# Patient Record
Sex: Male | Born: 1935
Health system: Southern US, Community
[De-identification: ages and names within clinical notes are randomized; demographics above are authoritative.]

## PROBLEM LIST (undated history)

## (undated) DIAGNOSIS — I714 Abdominal aortic aneurysm, without rupture, unspecified: Secondary | ICD-10-CM

## (undated) DIAGNOSIS — K5790 Diverticulosis of intestine, part unspecified, without perforation or abscess without bleeding: Secondary | ICD-10-CM

## (undated) DIAGNOSIS — K219 Gastro-esophageal reflux disease without esophagitis: Secondary | ICD-10-CM

## (undated) DIAGNOSIS — C4490 Unspecified malignant neoplasm of skin, unspecified: Secondary | ICD-10-CM

## (undated) DIAGNOSIS — I10 Essential (primary) hypertension: Secondary | ICD-10-CM

## (undated) DIAGNOSIS — I472 Ventricular tachycardia: Secondary | ICD-10-CM

## (undated) DIAGNOSIS — E538 Deficiency of other specified B group vitamins: Secondary | ICD-10-CM

## (undated) DIAGNOSIS — R7989 Other specified abnormal findings of blood chemistry: Secondary | ICD-10-CM

## (undated) DIAGNOSIS — G629 Polyneuropathy, unspecified: Secondary | ICD-10-CM

## (undated) DIAGNOSIS — G709 Myoneural disorder, unspecified: Secondary | ICD-10-CM

## (undated) DIAGNOSIS — E119 Type 2 diabetes mellitus without complications: Secondary | ICD-10-CM

## (undated) DIAGNOSIS — C801 Malignant (primary) neoplasm, unspecified: Secondary | ICD-10-CM

## (undated) DIAGNOSIS — I219 Acute myocardial infarction, unspecified: Secondary | ICD-10-CM

## (undated) DIAGNOSIS — I255 Ischemic cardiomyopathy: Secondary | ICD-10-CM

## (undated) DIAGNOSIS — E785 Hyperlipidemia, unspecified: Secondary | ICD-10-CM

## (undated) DIAGNOSIS — I2510 Atherosclerotic heart disease of native coronary artery without angina pectoris: Secondary | ICD-10-CM

## (undated) DIAGNOSIS — D649 Anemia, unspecified: Secondary | ICD-10-CM

## (undated) DIAGNOSIS — R0600 Dyspnea, unspecified: Secondary | ICD-10-CM

## (undated) HISTORY — PX: VASCULAR SURGERY: SHX849

## (undated) HISTORY — DX: Acute myocardial infarction, unspecified: I21.9

## (undated) HISTORY — PX: OTHER SURGICAL HISTORY: SHX169

## (undated) HISTORY — PX: EYE SURGERY: SHX253

## (undated) HISTORY — PX: CORONARY ANGIOPLASTY: SHX604

## (undated) HISTORY — DX: Ischemic cardiomyopathy: I25.5

## (undated) HISTORY — DX: Ventricular tachycardia: I47.2

---

## 2004-12-14 ENCOUNTER — Ambulatory Visit: Payer: Self-pay | Admitting: Orthopaedic Surgery

## 2004-12-28 ENCOUNTER — Inpatient Hospital Stay: Payer: Self-pay | Admitting: Internal Medicine

## 2004-12-28 ENCOUNTER — Other Ambulatory Visit: Payer: Self-pay

## 2005-03-14 ENCOUNTER — Ambulatory Visit: Payer: Self-pay | Admitting: Internal Medicine

## 2005-07-31 ENCOUNTER — Ambulatory Visit: Payer: Self-pay | Admitting: Orthopaedic Surgery

## 2006-05-08 ENCOUNTER — Ambulatory Visit: Payer: Self-pay | Admitting: Gastroenterology

## 2006-05-28 HISTORY — PX: COLON SURGERY: SHX602

## 2006-06-26 ENCOUNTER — Inpatient Hospital Stay: Payer: Self-pay | Admitting: Surgery

## 2006-08-06 ENCOUNTER — Ambulatory Visit: Payer: Self-pay | Admitting: Internal Medicine

## 2006-08-28 ENCOUNTER — Ambulatory Visit: Payer: Self-pay | Admitting: Internal Medicine

## 2006-11-06 ENCOUNTER — Ambulatory Visit: Payer: Self-pay | Admitting: Internal Medicine

## 2006-11-27 ENCOUNTER — Ambulatory Visit: Payer: Self-pay | Admitting: Internal Medicine

## 2006-12-17 ENCOUNTER — Ambulatory Visit: Payer: Self-pay | Admitting: Pain Medicine

## 2007-01-02 ENCOUNTER — Ambulatory Visit: Payer: Self-pay | Admitting: Pain Medicine

## 2007-01-16 ENCOUNTER — Ambulatory Visit: Payer: Self-pay | Admitting: Pain Medicine

## 2007-02-26 ENCOUNTER — Ambulatory Visit: Payer: Self-pay | Admitting: Internal Medicine

## 2007-03-05 ENCOUNTER — Ambulatory Visit: Payer: Self-pay | Admitting: Internal Medicine

## 2007-03-29 ENCOUNTER — Ambulatory Visit: Payer: Self-pay | Admitting: Internal Medicine

## 2007-05-29 ENCOUNTER — Ambulatory Visit: Payer: Self-pay | Admitting: Internal Medicine

## 2007-06-04 ENCOUNTER — Ambulatory Visit: Payer: Self-pay | Admitting: Internal Medicine

## 2007-06-29 ENCOUNTER — Ambulatory Visit: Payer: Self-pay | Admitting: Internal Medicine

## 2007-08-29 ENCOUNTER — Ambulatory Visit: Payer: Self-pay | Admitting: Internal Medicine

## 2007-09-11 ENCOUNTER — Ambulatory Visit: Payer: Self-pay | Admitting: Gastroenterology

## 2007-09-24 ENCOUNTER — Ambulatory Visit: Payer: Self-pay | Admitting: Internal Medicine

## 2007-09-29 ENCOUNTER — Ambulatory Visit: Payer: Self-pay | Admitting: Internal Medicine

## 2007-10-27 ENCOUNTER — Ambulatory Visit: Payer: Self-pay | Admitting: Internal Medicine

## 2007-12-27 ENCOUNTER — Ambulatory Visit: Payer: Self-pay | Admitting: Internal Medicine

## 2008-01-21 ENCOUNTER — Ambulatory Visit: Payer: Self-pay | Admitting: Internal Medicine

## 2008-01-27 ENCOUNTER — Ambulatory Visit: Payer: Self-pay | Admitting: Internal Medicine

## 2008-05-28 ENCOUNTER — Ambulatory Visit: Payer: Self-pay | Admitting: Internal Medicine

## 2008-06-23 ENCOUNTER — Ambulatory Visit: Payer: Self-pay | Admitting: Internal Medicine

## 2008-06-28 ENCOUNTER — Ambulatory Visit: Payer: Self-pay | Admitting: Internal Medicine

## 2008-10-15 ENCOUNTER — Ambulatory Visit: Payer: Self-pay | Admitting: Gastroenterology

## 2008-11-26 ENCOUNTER — Ambulatory Visit: Payer: Self-pay | Admitting: Internal Medicine

## 2008-12-22 ENCOUNTER — Ambulatory Visit: Payer: Self-pay | Admitting: Internal Medicine

## 2008-12-26 ENCOUNTER — Ambulatory Visit: Payer: Self-pay | Admitting: Internal Medicine

## 2009-05-28 ENCOUNTER — Ambulatory Visit: Payer: Self-pay | Admitting: Internal Medicine

## 2009-06-23 ENCOUNTER — Ambulatory Visit: Payer: Self-pay | Admitting: Internal Medicine

## 2009-06-28 ENCOUNTER — Ambulatory Visit: Payer: Self-pay | Admitting: Internal Medicine

## 2009-11-18 ENCOUNTER — Ambulatory Visit: Payer: Self-pay | Admitting: Gastroenterology

## 2009-11-26 ENCOUNTER — Ambulatory Visit: Payer: Self-pay | Admitting: Internal Medicine

## 2009-12-22 ENCOUNTER — Ambulatory Visit: Payer: Self-pay | Admitting: Internal Medicine

## 2009-12-26 ENCOUNTER — Ambulatory Visit: Payer: Self-pay | Admitting: Internal Medicine

## 2010-01-26 ENCOUNTER — Ambulatory Visit: Payer: Self-pay | Admitting: Internal Medicine

## 2010-06-13 ENCOUNTER — Ambulatory Visit: Payer: Self-pay | Admitting: Internal Medicine

## 2010-12-14 ENCOUNTER — Ambulatory Visit: Payer: Self-pay | Admitting: Internal Medicine

## 2010-12-20 ENCOUNTER — Ambulatory Visit: Payer: Self-pay | Admitting: Internal Medicine

## 2010-12-27 ENCOUNTER — Ambulatory Visit: Payer: Self-pay | Admitting: Internal Medicine

## 2011-06-19 ENCOUNTER — Ambulatory Visit: Payer: Self-pay | Admitting: Internal Medicine

## 2011-12-19 ENCOUNTER — Ambulatory Visit: Payer: Self-pay | Admitting: Internal Medicine

## 2011-12-19 LAB — COMPREHENSIVE METABOLIC PANEL
Albumin: 4.3 g/dL (ref 3.4–5.0)
BUN: 16 mg/dL (ref 7–18)
Chloride: 104 mmol/L (ref 98–107)
Creatinine: 1.08 mg/dL (ref 0.60–1.30)
EGFR (African American): >60
EGFR (Non-African Amer.): >60
Osmolality: 288 (ref 275–301)
Potassium: 4.1 mmol/L (ref 3.5–5.1)
SGOT(AST): 9 U/L — ABNORMAL LOW (ref 15–37)
SGPT (ALT): 18 U/L
Sodium: 144 mmol/L (ref 136–145)
Total Protein: 7.6 g/dL (ref 6.4–8.2)

## 2011-12-19 LAB — CBC CANCER CENTER
Basophil #: 0.2 x10 3/mm — ABNORMAL HIGH (ref 0.0–0.1)
Basophil %: 2 %
Eosinophil #: 0.7 x10 3/mm (ref 0.0–0.7)
Eosinophil %: 8.9 %
Lymphocyte #: 1.5 x10 3/mm (ref 1.0–3.6)
Lymphocyte %: 19.5 %
MCH: 29.7 pg (ref 26.0–34.0)
MCV: 92 fL (ref 80–100)
Monocyte #: 0.6 x10 3/mm (ref 0.2–1.0)
Monocyte %: 7.6 %
Neutrophil %: 62 %
Platelet: 195 x10 3/mm (ref 150–440)
RBC: 4.98 10*6/uL (ref 4.40–5.90)

## 2011-12-27 ENCOUNTER — Ambulatory Visit: Payer: Self-pay | Admitting: Internal Medicine

## 2012-02-20 ENCOUNTER — Ambulatory Visit: Payer: Self-pay | Admitting: Gastroenterology

## 2012-02-21 LAB — PATHOLOGY REPORT

## 2012-08-28 HISTORY — PX: ABDOMINAL AORTIC ANEURYSM REPAIR: SHX42

## 2014-03-06 ENCOUNTER — Ambulatory Visit: Payer: Self-pay | Admitting: Vascular Surgery

## 2014-04-14 ENCOUNTER — Ambulatory Visit: Payer: Self-pay | Admitting: Vascular Surgery

## 2014-04-14 LAB — CBC
HCT: 40.5 % (ref 40.0–52.0)
HGB: 13.4 g/dL (ref 13.0–18.0)
MCH: 29.8 pg (ref 26.0–34.0)
MCHC: 33 g/dL (ref 32.0–36.0)
MCV: 90 fL (ref 80–100)
Platelet: 169 10*3/uL (ref 150–440)
RBC: 4.49 10*6/uL (ref 4.40–5.90)
RDW: 13.7 % (ref 11.5–14.5)
WBC: 7.3 10*3/uL (ref 3.8–10.6)

## 2014-04-14 LAB — BASIC METABOLIC PANEL
ANION GAP: 8 (ref 7–16)
BUN: 15 mg/dL (ref 7–18)
CO2: 29 mmol/L (ref 21–32)
Calcium, Total: 8.8 mg/dL (ref 8.5–10.1)
Chloride: 105 mmol/L (ref 98–107)
Creatinine: 1.09 mg/dL (ref 0.60–1.30)
EGFR (African American): >60
EGFR (Non-African Amer.): >60
GLUCOSE: 136 mg/dL — AB (ref 65–99)
OSMOLALITY: 286 (ref 275–301)
Potassium: 4.9 mmol/L (ref 3.5–5.1)
SODIUM: 142 mmol/L (ref 136–145)

## 2014-04-14 LAB — URINALYSIS, COMPLETE
BACTERIA: NONE SEEN
Bilirubin,UR: NEGATIVE
Blood: NEGATIVE
Glucose,UR: NEGATIVE mg/dL (ref 0–75)
Hyaline Cast: 9
KETONE: NEGATIVE
Nitrite: NEGATIVE
PH: 5 (ref 4.5–8.0)
PROTEIN: NEGATIVE
RBC,UR: 3 /HPF (ref 0–5)
SQUAMOUS EPITHELIAL: NONE SEEN
Specific Gravity: 1.015 (ref 1.003–1.030)

## 2014-04-14 LAB — PROTIME-INR
INR: 1
PROTHROMBIN TIME: 13.5 s (ref 11.5–14.7)

## 2014-04-14 LAB — MRSA PCR SCREENING

## 2014-04-14 LAB — APTT: ACTIVATED PTT: 29 s (ref 23.6–35.9)

## 2014-04-22 ENCOUNTER — Inpatient Hospital Stay: Payer: Self-pay | Admitting: Vascular Surgery

## 2014-04-23 LAB — COMPREHENSIVE METABOLIC PANEL
ALBUMIN: 3.5 g/dL (ref 3.4–5.0)
Alkaline Phosphatase: 51 U/L
Anion Gap: 12 (ref 7–16)
BILIRUBIN TOTAL: 0.4 mg/dL (ref 0.2–1.0)
BUN: 14 mg/dL (ref 7–18)
CALCIUM: 8.3 mg/dL — AB (ref 8.5–10.1)
CHLORIDE: 108 mmol/L — AB (ref 98–107)
Co2: 21 mmol/L (ref 21–32)
Creatinine: 1.09 mg/dL (ref 0.60–1.30)
EGFR (African American): >60
EGFR (Non-African Amer.): >60
Glucose: 150 mg/dL — ABNORMAL HIGH (ref 65–99)
Osmolality: 285 (ref 275–301)
Potassium: 4.6 mmol/L (ref 3.5–5.1)
SGOT(AST): 18 U/L (ref 15–37)
SGPT (ALT): 15 U/L
SODIUM: 141 mmol/L (ref 136–145)
Total Protein: 6.7 g/dL (ref 6.4–8.2)

## 2014-04-23 LAB — PROTIME-INR
INR: 1.1
Prothrombin Time: 13.9 secs (ref 11.5–14.7)

## 2014-04-23 LAB — CBC WITH DIFFERENTIAL/PLATELET
BASOS ABS: 0 10*3/uL (ref 0.0–0.1)
Basophil %: 0.3 %
Eosinophil #: 0 10*3/uL (ref 0.0–0.7)
Eosinophil %: 0 %
HCT: 40.8 % (ref 40.0–52.0)
HGB: 13.5 g/dL (ref 13.0–18.0)
Lymphocyte #: 0.8 10*3/uL — ABNORMAL LOW (ref 1.0–3.6)
Lymphocyte %: 7.4 %
MCH: 30 pg (ref 26.0–34.0)
MCHC: 33.2 g/dL (ref 32.0–36.0)
MCV: 90 fL (ref 80–100)
MONO ABS: 0.8 x10 3/mm (ref 0.2–1.0)
Monocyte %: 7.6 %
NEUTROS ABS: 9.2 10*3/uL — AB (ref 1.4–6.5)
NEUTROS PCT: 84.7 %
Platelet: 184 10*3/uL (ref 150–440)
RBC: 4.52 10*6/uL (ref 4.40–5.90)
RDW: 13.9 % (ref 11.5–14.5)
WBC: 10.8 10*3/uL — ABNORMAL HIGH (ref 3.8–10.6)

## 2014-04-23 LAB — PHOSPHORUS: Phosphorus: 3.1 mg/dL (ref 2.5–4.9)

## 2014-04-23 LAB — APTT: Activated PTT: 29.6 secs (ref 23.6–35.9)

## 2014-04-23 LAB — MAGNESIUM: MAGNESIUM: 1.8 mg/dL

## 2014-09-19 DIAGNOSIS — D631 Anemia in chronic kidney disease: Secondary | ICD-10-CM | POA: Diagnosis not present

## 2014-09-19 DIAGNOSIS — N2889 Other specified disorders of kidney and ureter: Secondary | ICD-10-CM | POA: Diagnosis not present

## 2014-09-19 DIAGNOSIS — N179 Acute kidney failure, unspecified: Secondary | ICD-10-CM | POA: Diagnosis not present

## 2014-09-19 DIAGNOSIS — E871 Hypo-osmolality and hyponatremia: Secondary | ICD-10-CM | POA: Diagnosis not present

## 2014-09-28 DIAGNOSIS — E119 Type 2 diabetes mellitus without complications: Secondary | ICD-10-CM | POA: Diagnosis not present

## 2014-11-04 DIAGNOSIS — I739 Peripheral vascular disease, unspecified: Secondary | ICD-10-CM | POA: Diagnosis not present

## 2014-11-04 DIAGNOSIS — I10 Essential (primary) hypertension: Secondary | ICD-10-CM | POA: Diagnosis not present

## 2014-11-04 DIAGNOSIS — E785 Hyperlipidemia, unspecified: Secondary | ICD-10-CM | POA: Diagnosis not present

## 2014-11-04 DIAGNOSIS — I714 Abdominal aortic aneurysm, without rupture: Secondary | ICD-10-CM | POA: Diagnosis not present

## 2014-11-04 DIAGNOSIS — E669 Obesity, unspecified: Secondary | ICD-10-CM | POA: Diagnosis not present

## 2014-12-19 NOTE — Op Note (Signed)
PATIENT NAME:  Nicholas Parker, Nicholas Parker MR#:  [id number] DATE OF BIRTH:  1934-08-09  DATE OF PROCEDURE:  04/22/2014  PREOPERATIVE DIAGNOSES:  1.  Abdominal aortic aneurysm.  2.  Hypertension.  3.  Diabetes.   POSTOPERATIVE DIAGNOSES: 1.  Abdominal aortic aneurysm.  2.  Hypertension.  3.  Diabetes.   PROCEDURES PERFORMED: 1.  Repair of abdominal aortic aneurysm with Gore C3 Excluder endoprosthesis.  2.  Introduction catheter intra-aorta percutaneously, right femoral approach.  3.  Introduction catheter intra-aorta percutaneously, left femoral approach.  4.  Bilateral closure of femoral access sites using the Perclose device in a pre-close technique.   SURGEONS: Leotis Pain, MD  CO-SURGEON: Katha Cabal, MD   ANESTHESIA: General by endotracheal intubation.   FLUIDS: Per anesthesia record.   ESTIMATED BLOOD LOSS: 25 mL.   SPECIMEN: None.   FLUOROSCOPY TIME: 9.9 minutes.   CONTRAST USED: Isovue 65 mL.   INDICATIONS: Mr. Savarese is a 81 year old gentleman who was found to have an abdominal aortic aneurysm greater than 5 cm. It was suitable for endovascular repair. Risks, benefits, as well as alternative therapies were reviewed. All questions answered. The patient has agreed to proceed.   DESCRIPTION OF PROCEDURE: The patient is taken to special procedures and placed in the supine position. After adequate sedation is achieved, the patient was prepped from just below the nipple line to just above the knees and then draped in a sterile fashion.   Appropriate timeout is called.   With Dr. Lucky Cowboy working on the right and myself working on the left, common femoral arteries are imaged with ultrasound. They are both noted to be echolucent and pulsatile indicating patency. Images recorded for the permanent record, and under real-time ultrasound visualization Seldinger needle is used to access the right common femoral, and a micropuncture needle used for the left. Wires are then advanced and 6 [place]  sheath is placed. Perclose devices are then utilized in a pre-close fashion, placing the first Perclose device on both sides at the 11 o'clock position and the second one at 1:00.   Sheaths are then upsized to an 8 [place] sheath. Amplatz Super Stiff wire is advanced through the left side and a J-wire and KMP catheter in the right.   With the pigtail catheter positioned at the level of T12, AP projection of the aorta is obtained. Length measurements are then made. Heparin 6000 units is given and a 23 x 14 x 18 main body is selected and prepped on the back field. The sheath on the left is then upsized from 8 [place] to 53 [place]. Sheath on the right side is upsized from an 8 [place] to a 12 [place].   The main body is then advanced through the sheath and positioned just at the level of the renals. Follow up imaging is obtained under magnified view with approximately 10 degrees cranial and the C3 main body deployed down to release the contralateral gate.   The KMP catheter is then pulled back into the aneurysm sac, and using a glidewire and the KMP catheter, the contralateral gate is engaged, and subsequently the wire is advanced through the main body. Pigtail catheter is then exchanged for the Kumpe catheter, twirled in the main body verifying proper positioning, and then advanced to just above the stent graft. Follow up imaging demonstrates that the left renal is partially covered and therefore the C3 device is re-constrained and moved down approximately 2 mm, released and subsequently follow up imaging demonstrates excellent location within  a millimeter of the left renal, which is the lower renal. Amplatz Super Stiff wire is advanced through the pigtail catheter from the right. LAO projection of the pelvis is obtained length and contrast is injected. Length measurements are made and a 16 x 14 contralateral limb is prepped on the back table, advanced through the 12 French sheath and deployed.   RAO projection  in the right is then obtained and the internal iliac is marked and then the ipsilateral limb is deployed with slight upward pressure to allow for the stent to land proximal to the origin, which it did.   Coda balloon is then used to seal both sides. Pigtail catheter is advanced up the right and an AP projection of the aorta is obtained with 30 mL bolus of contrast looking for endoleaks. No further endoleaks are noted and it has excellent seal both proximally and distally. The catheter is removed over wire.   Working first on the left and then the right, the Perclose devices are sealed. On the left, a third device is placed with excellent result and hemostasis is obtained. On the right, the 2 pre-close worked without difficulty. Light pressure is held for 5 minutes. Both incisions are then closed with Monocryl and Dermabond. The patient tolerated the procedure well. There were no complications.   INTERPRETATION: Initial views of the aneurysm demonstrate infrarenal location. There is mild to moderate bilateral renal artery disease, more so on the right than the left. Left is the lower renal artery. There is moderate atherosclerotic disease at the aortic bifurcation.   Following deployment of the stent graft and sealing proximally and distally, there is complete seal with exclusion of the aneurysm. There is no evidence of type 1, type 2 or type 3 endoleak.   ADDENDUM: During the deployment of the graft, after the graft had been completely deployed, two 10 x 4 balloons were used to angioplasty the origins of the iliac arteries to fully expand the stent graft. Inflations were for 30 seconds to 10 atmospheres.   ____________________________ Katha Cabal, MD ggs:TT D: 04/22/2014 12:02:20 ET T: 04/22/2014 22:23:36 ET JOB#: [id number]  cc: Katha Cabal, MD, <Dictator> Adin Hector, MD Katha Cabal MD ELECTRONICALLY SIGNED 05/05/2014 22:39

## 2014-12-19 NOTE — Op Note (Signed)
PATIENT NAME:  Nicholas Parker, Nicholas Parker MR#:  [id number] DATE OF BIRTH:  August 24, 1934  DATE OF PROCEDURE:  04/22/2014  PREOPERATIVE DIAGNOSES:  1.  Abdominal aortic aneurysm.  2.  Hypertension.  3.  Hyperlipidemia.   POSTOPERATIVE DIAGNOSES:  1.  Abdominal aortic aneurysm.  2.  Hypertension.  3.  Hyperlipidemia.   PROCEDURE: 1.  Ultrasound guidance for vascular access to bilateral femoral arteries; right by Dr. Lucky Cowboy, left by Dr. Delana Meyer.  2.  Catheter placement to aorta from bilateral femoral approaches; right by Dr. Lucky Cowboy, left by Dr. Delana Meyer.  3.  Placement of a Gore Excluder endoprosthesis, main body, left, main body 23 mm diameter proximal, 14 mm diameter distal, 18 cm length, with a 16 mm diameter by 14 cm length contralateral leg. 4.  ProGlide closure device, bilateral femoral arteries; right by Dr. Lucky Cowboy, left by Dr. Delana Meyer.   CO- SURGEONS: Katha Cabal, MD and Algernon Huxley, MD.   ANESTHESIA: General.   BLOOD LOSS: 25 mL  FLUOROSCOPY TIME: Approximately 10 minutes.   CONTRAST: 65 mL of contrast was used.   INDICATION FOR PROCEDURE: A 81 year old gentleman with an abdominal aortic aneurysm of greater than 5 cm in maximal diameter has now reached acceptable size for prophylactic repair. He is brought in for an endovascular repair, as his anatomy is suitable for an endovascular repair. Risks and benefits were discussed. Informed consent was obtained.   DESCRIPTION OF THE PROCEDURE: The patient is brought to the vascular suite with the help of our anesthesia colleagues providing general anesthetic. The abdomen and groins were sterilely prepped and draped, and a sterile surgical field was created. Both femoral arteries were visualized under ultrasound and found to be patent and accessed under direct ultrasound guidance without difficulty with a Seldinger needle. A J-wire and 6-French sheaths were placed. Dr. Delana Meyer performed the left and I performed the right. Permanent images were recorded.  J-wires were placed and we exchanged and did 2 ProGlide devices in a preclose fashion on each side. Again, Dr. Delana Meyer did the left and I did the right, and 8-French sheaths were placed and the patient was systemically heparinized. The pigtail catheter was placed up the left initially and aortogram was performed. This showed the known infrarenal abdominal aortic aneurysm. There was a wide-mouthed origin of the left renal artery and some mild disease in the right renal artery which was not flow limiting. We then upsized to a 16 Fr sheath over a stiff wire on the left side and I put a Kumpe catheter up the right for a magnified image at the level of the renal arteries. The stent graft was then deployed at the base of the left renal artery, which was slightly lower from the left side. This was a 23 mm diameter, 18 cm length device with a 14 mm left iliac limb. I then cannulated the contralateral gate without difficulty with a Kumpe catheter and stiff angled Glidewire, and confirmed successful cannulation by twirling the pigtail catheter in the main body. Imaging performed of the main body after initial deployment showed about a millimeter or two of the left renal artery was covered, so we elected to reconstrain the device. It was reconstrained and deployed about a millimeter or two below the left renal artery, which was lower. At this point retrograde arteriogram was performed of the right femoral artery, and I used a 16 mm diameter by 14 cm length iliac limb on the right to come down to just above the hypogastric artery.  Dr. Delana Meyer completed the deployment in the main body on the left just above the hypogastric artery. All junction points and seal zones were ironed out with a compliant balloon, and we used a 10 mm noncompliant balloon in the proximal iliac arteries bilaterally to ensure that there was no narrowing in these locations. Pigtail catheter was then placed up on the right and then a completion aortogram was  performed. This showed excellent flow through both the renal arteries. Both hypogastric arteries remained patent, and there was excellent flow through the stent graft with no significant endoleak seen. At this point, we elected to terminate the procedure. The sheaths were removed. The ProGlide devices were secured down with excellent hemostatic result and the addition of a third ProGlide device on the left. Skin incision was closed with Monocryl and a sterile dressing was placed. The patient tolerated the procedure well and was taken to the recovery room in stable condition.   ____________________________ Algernon Huxley, MD jsd:ST D: 04/22/2014 11:50:20 ET T: 04/22/2014 22:51:26 ET JOB#: [id number]  cc: Algernon Huxley, MD, <Dictator> Adin Hector, MD Algernon Huxley MD ELECTRONICALLY SIGNED 04/27/2014 11:50

## 2015-02-01 DIAGNOSIS — I10 Essential (primary) hypertension: Secondary | ICD-10-CM | POA: Diagnosis not present

## 2015-02-01 DIAGNOSIS — E780 Pure hypercholesterolemia: Secondary | ICD-10-CM | POA: Diagnosis not present

## 2015-02-01 DIAGNOSIS — I2510 Atherosclerotic heart disease of native coronary artery without angina pectoris: Secondary | ICD-10-CM | POA: Diagnosis not present

## 2015-02-01 DIAGNOSIS — E119 Type 2 diabetes mellitus without complications: Secondary | ICD-10-CM | POA: Diagnosis not present

## 2015-02-04 DIAGNOSIS — E782 Mixed hyperlipidemia: Secondary | ICD-10-CM | POA: Insufficient documentation

## 2015-02-04 DIAGNOSIS — I10 Essential (primary) hypertension: Secondary | ICD-10-CM | POA: Insufficient documentation

## 2015-02-04 DIAGNOSIS — E785 Hyperlipidemia, unspecified: Secondary | ICD-10-CM | POA: Insufficient documentation

## 2015-02-08 DIAGNOSIS — I714 Abdominal aortic aneurysm, without rupture: Secondary | ICD-10-CM | POA: Diagnosis not present

## 2015-02-08 DIAGNOSIS — I10 Essential (primary) hypertension: Secondary | ICD-10-CM | POA: Diagnosis not present

## 2015-02-08 DIAGNOSIS — I2510 Atherosclerotic heart disease of native coronary artery without angina pectoris: Secondary | ICD-10-CM | POA: Diagnosis not present

## 2015-02-08 DIAGNOSIS — D519 Vitamin B12 deficiency anemia, unspecified: Secondary | ICD-10-CM | POA: Diagnosis not present

## 2015-02-12 DIAGNOSIS — I10 Essential (primary) hypertension: Secondary | ICD-10-CM | POA: Diagnosis not present

## 2015-02-12 DIAGNOSIS — I2510 Atherosclerotic heart disease of native coronary artery without angina pectoris: Secondary | ICD-10-CM | POA: Diagnosis not present

## 2015-02-12 DIAGNOSIS — I714 Abdominal aortic aneurysm, without rupture: Secondary | ICD-10-CM | POA: Diagnosis not present

## 2015-02-12 DIAGNOSIS — E782 Mixed hyperlipidemia: Secondary | ICD-10-CM | POA: Diagnosis not present

## 2015-03-05 DIAGNOSIS — X32XXXA Exposure to sunlight, initial encounter: Secondary | ICD-10-CM | POA: Diagnosis not present

## 2015-03-05 DIAGNOSIS — L570 Actinic keratosis: Secondary | ICD-10-CM | POA: Diagnosis not present

## 2015-03-05 DIAGNOSIS — L218 Other seborrheic dermatitis: Secondary | ICD-10-CM | POA: Diagnosis not present

## 2015-03-05 DIAGNOSIS — Z85828 Personal history of other malignant neoplasm of skin: Secondary | ICD-10-CM | POA: Diagnosis not present

## 2015-03-05 DIAGNOSIS — H0015 Chalazion left lower eyelid: Secondary | ICD-10-CM | POA: Diagnosis not present

## 2015-03-11 DIAGNOSIS — Z86010 Personal history of colonic polyps: Secondary | ICD-10-CM | POA: Diagnosis not present

## 2015-03-11 DIAGNOSIS — Z85038 Personal history of other malignant neoplasm of large intestine: Secondary | ICD-10-CM | POA: Diagnosis not present

## 2015-04-12 ENCOUNTER — Encounter: Payer: Self-pay | Admitting: *Deleted

## 2015-04-13 ENCOUNTER — Ambulatory Visit
Admission: RE | Admit: 2015-04-13 | Discharge: 2015-04-13 | Disposition: A | Discharge status: Home / Self Care | Payer: Commercial Managed Care - HMO | Source: Ambulatory Visit | Attending: Gastroenterology | Admitting: Gastroenterology

## 2015-04-13 ENCOUNTER — Ambulatory Visit: Payer: Commercial Managed Care - HMO | Admitting: Anesthesiology

## 2015-04-13 ENCOUNTER — Encounter
Admission: RE | Disposition: A | Discharge status: Home / Self Care | Payer: Self-pay | Source: Ambulatory Visit | Attending: Gastroenterology

## 2015-04-13 ENCOUNTER — Encounter: Payer: Self-pay | Admitting: *Deleted

## 2015-04-13 DIAGNOSIS — E538 Deficiency of other specified B group vitamins: Secondary | ICD-10-CM | POA: Insufficient documentation

## 2015-04-13 DIAGNOSIS — I714 Abdominal aortic aneurysm, without rupture: Secondary | ICD-10-CM | POA: Diagnosis not present

## 2015-04-13 DIAGNOSIS — Z955 Presence of coronary angioplasty implant and graft: Secondary | ICD-10-CM | POA: Diagnosis not present

## 2015-04-13 DIAGNOSIS — G629 Polyneuropathy, unspecified: Secondary | ICD-10-CM | POA: Insufficient documentation

## 2015-04-13 DIAGNOSIS — Z85038 Personal history of other malignant neoplasm of large intestine: Secondary | ICD-10-CM | POA: Diagnosis not present

## 2015-04-13 DIAGNOSIS — Z7982 Long term (current) use of aspirin: Secondary | ICD-10-CM | POA: Insufficient documentation

## 2015-04-13 DIAGNOSIS — Z86010 Personal history of colonic polyps: Secondary | ICD-10-CM | POA: Diagnosis not present

## 2015-04-13 DIAGNOSIS — Z09 Encounter for follow-up examination after completed treatment for conditions other than malignant neoplasm: Secondary | ICD-10-CM | POA: Diagnosis not present

## 2015-04-13 DIAGNOSIS — D649 Anemia, unspecified: Secondary | ICD-10-CM | POA: Insufficient documentation

## 2015-04-13 DIAGNOSIS — Z85828 Personal history of other malignant neoplasm of skin: Secondary | ICD-10-CM | POA: Insufficient documentation

## 2015-04-13 DIAGNOSIS — J449 Chronic obstructive pulmonary disease, unspecified: Secondary | ICD-10-CM | POA: Insufficient documentation

## 2015-04-13 DIAGNOSIS — I2510 Atherosclerotic heart disease of native coronary artery without angina pectoris: Secondary | ICD-10-CM | POA: Insufficient documentation

## 2015-04-13 DIAGNOSIS — Z87891 Personal history of nicotine dependence: Secondary | ICD-10-CM | POA: Diagnosis not present

## 2015-04-13 DIAGNOSIS — E119 Type 2 diabetes mellitus without complications: Secondary | ICD-10-CM | POA: Insufficient documentation

## 2015-04-13 DIAGNOSIS — K219 Gastro-esophageal reflux disease without esophagitis: Secondary | ICD-10-CM | POA: Diagnosis not present

## 2015-04-13 DIAGNOSIS — E291 Testicular hypofunction: Secondary | ICD-10-CM | POA: Insufficient documentation

## 2015-04-13 DIAGNOSIS — I10 Essential (primary) hypertension: Secondary | ICD-10-CM | POA: Diagnosis not present

## 2015-04-13 DIAGNOSIS — E785 Hyperlipidemia, unspecified: Secondary | ICD-10-CM | POA: Insufficient documentation

## 2015-04-13 DIAGNOSIS — Z539 Procedure and treatment not carried out, unspecified reason: Secondary | ICD-10-CM | POA: Insufficient documentation

## 2015-04-13 DIAGNOSIS — Z888 Allergy status to other drugs, medicaments and biological substances status: Secondary | ICD-10-CM | POA: Insufficient documentation

## 2015-04-13 DIAGNOSIS — Z79899 Other long term (current) drug therapy: Secondary | ICD-10-CM | POA: Insufficient documentation

## 2015-04-13 HISTORY — DX: Type 2 diabetes mellitus without complications: E11.9

## 2015-04-13 HISTORY — DX: Abdominal aortic aneurysm, without rupture: I71.4

## 2015-04-13 HISTORY — DX: Abdominal aortic aneurysm, without rupture, unspecified: I71.40

## 2015-04-13 HISTORY — DX: Myoneural disorder, unspecified: G70.9

## 2015-04-13 HISTORY — DX: Deficiency of other specified B group vitamins: E53.8

## 2015-04-13 HISTORY — DX: Atherosclerotic heart disease of native coronary artery without angina pectoris: I25.10

## 2015-04-13 HISTORY — DX: Essential (primary) hypertension: I10

## 2015-04-13 HISTORY — PX: COLONOSCOPY WITH PROPOFOL: SHX5780

## 2015-04-13 HISTORY — DX: Anemia, unspecified: D64.9

## 2015-04-13 HISTORY — DX: Other specified abnormal findings of blood chemistry: R79.89

## 2015-04-13 HISTORY — DX: Unspecified malignant neoplasm of skin, unspecified: C44.90

## 2015-04-13 HISTORY — DX: Polyneuropathy, unspecified: G62.9

## 2015-04-13 HISTORY — DX: Hyperlipidemia, unspecified: E78.5

## 2015-04-13 HISTORY — DX: Malignant (primary) neoplasm, unspecified: C80.1

## 2015-04-13 LAB — GLUCOSE, CAPILLARY: GLUCOSE-CAPILLARY: 110 mg/dL — AB (ref 65–99)

## 2015-04-13 SURGERY — COLONOSCOPY WITH PROPOFOL
Anesthesia: General

## 2015-04-13 MED ORDER — EPHEDRINE SULFATE 50 MG/ML IJ SOLN
INTRAMUSCULAR | Status: DC | PRN
  Administered 2015-04-13: 10 mg via INTRAVENOUS

## 2015-04-13 MED ORDER — SODIUM CHLORIDE 0.9 % IV SOLN: INTRAVENOUS | Status: DC

## 2015-04-13 MED ORDER — PHENYLEPHRINE HCL 10 MG/ML IJ SOLN
INTRAMUSCULAR | Status: DC | PRN
  Administered 2015-04-13: 50 ug via INTRAVENOUS

## 2015-04-13 MED ORDER — PROPOFOL INFUSION 10 MG/ML OPTIME
INTRAVENOUS | Status: DC | PRN
  Administered 2015-04-13: 120 ug/kg/min via INTRAVENOUS

## 2015-04-13 MED ORDER — SODIUM CHLORIDE 0.9 % IV SOLN
INTRAVENOUS | Status: DC
  Administered 2015-04-13: 09:00:00 via INTRAVENOUS

## 2015-04-13 NOTE — Op Note (Signed)
[facility] Gastroenterology Patient Name: Christopher Glasscock Procedure Date: 04/13/2015 9:04 AM MRN: [id number] Account #: [id number] Date of Birth: June 30, 1934 Admit Type: Outpatient Age: 81 Room: [facility] ENDO ROOM 3 Gender: Male Note Status: Finalized Procedure:         Colonoscopy Indications:       Personal history of malignant neoplasm of the colon,                     Personal history of colonic polyps Providers:         Lollie Sails, MD Referring MD:      [name], MD (Referring MD) Medicines:         Monitored Anesthesia Care Complications:     No immediate complications. Procedure:         Pre-Anesthesia Assessment:                    - ASA Grade Assessment: III - A patient with severe                     systemic disease.                    After obtaining informed consent, the colonoscope was                     passed under direct vision. Throughout the procedure, the                     patient's blood pressure, pulse, and oxygen saturations                     were monitored continuously. The Colonoscope was                     introduced through the anus with the intention of                     advancing to the cecum. The scope was advanced to the                     sigmoid colon before the procedure was aborted.                     Medications were given. The colonoscopy was unusually                     difficult due to poor endoscopic visualization. The                     patient tolerated the procedure well. Findings:      the scope was carefully advanced to about the upper sigmoid to       descencing colon region. There was residual prep debris, coating the       lens with an oily residue precluding adequate examination. This would       not come off of the lens despite multiple attempts to clean the lens and       rinse. Proceedure aborted. Impression:        - No specimens collected.                    - The procedure was aborted due to  poor endoscopic  visualization. Recommendation:    - Put patient on a clear liquid diet starting today.                     Reschedule for tomorrow. Repeat prep. Lollie Sails, MD 04/13/2015 9:27:53 AM This report has been signed electronically. Number of Addenda: 0 Note Initiated On: 04/13/2015 9:04 AM Total Procedure Duration: 0 hours 7 minutes 9 seconds       [facility]

## 2015-04-13 NOTE — Transfer of Care (Signed)
Immediate Anesthesia Transfer of Care Note  Patient: Nicholas Parker  Procedure(s) Performed: Procedure(s): COLONOSCOPY WITH PROPOFOL (N/A)  Patient Location: PACU  Anesthesia Type:General  Level of Consciousness: awake  Airway & Oxygen Therapy: Patient Spontanous Breathing  Post-op Assessment: Report given to RN  Post vital signs: stable  Last Vitals:  Filed Vitals:   04/13/15 0931  BP: 113/39  Pulse: 54  Temp:   Resp: 27    Complications: No apparent anesthesia complications

## 2015-04-13 NOTE — Anesthesia Preprocedure Evaluation (Addendum)
Anesthesia Evaluation  Patient identified by MRN, date of birth, ID band Patient awake    Reviewed: Allergy & Precautions, NPO status , Patient's Chart, lab work & pertinent test results  History of Anesthesia Complications Negative for: history of anesthetic complications  Airway Mallampati: II  TM Distance: >3 FB Neck ROM: Full    Dental  (+) Partial Lower   Pulmonary COPD (mild, not tx)former smoker,          Cardiovascular hypertension, Pt. on medications + CAD (stents)     Neuro/Psych    GI/Hepatic GERD-  Medicated and Controlled,  Endo/Other  diabetes, Type 2, Oral Hypoglycemic Agents  Renal/GU      Musculoskeletal   Abdominal   Peds  Hematology  (+) anemia ,   Anesthesia Other Findings   Reproductive/Obstetrics                            Anesthesia Physical Anesthesia Plan  ASA: III  Anesthesia Plan: General   Post-op Pain Management:    Induction: Intravenous  Airway Management Planned: Nasal Cannula  Additional Equipment:   Intra-op Plan:   Post-operative Plan:   Informed Consent: I have reviewed the patients History and Physical, chart, labs and discussed the procedure including the risks, benefits and alternatives for the proposed anesthesia with the patient or authorized representative who has indicated his/her understanding and acceptance.     Plan Discussed with:   Anesthesia Plan Comments:         Anesthesia Quick Evaluation

## 2015-04-13 NOTE — Anesthesia Postprocedure Evaluation (Addendum)
  Anesthesia Post-op Note  Patient: Nicholas Parker  Procedure(s) Performed: Procedure(s): COLONOSCOPY WITH PROPOFOL (N/A)  Anesthesia type:General  Patient location: PACU  Post pain: Pain level controlled  Post assessment: Post-op Vital signs reviewed, Patient's Cardiovascular Status Stable, Respiratory Function Stable, Patent Airway and No signs of Nausea or vomiting  Post vital signs: Reviewed and stable  Last Vitals:  Filed Vitals:   04/13/15 0823  BP: 124/53  Pulse: 59  Temp: 36.4 C  Resp: 19    Level of consciousness: awake, alert  and patient cooperative  Complications: No apparent anesthesia complications

## 2015-04-13 NOTE — H&P (Signed)
Outpatient short stay form Pre-procedure 04/13/2015 9:00 AM Lollie Sails MD  Primary Physician: Dr. [name]  Reason for visit:  Colonoscopy  History of present illness:  Personal history of adenomatous colon polyps, personal history of colon cancer. Patient is a 81 year old male with urgently treated colon cancer diagnosed in 2007. Does has a history of adenomatous colon polyps. He tolerated his prep well. Does take a daily 325 mg aspirin in regards to his history of cardiac stents and stent placement in a abdominal aortic aneurysm. Last ultrasound of that by his vascular surgeon was good.    Current facility-administered medications:  .  0.9 %  sodium chloride infusion, , Intravenous, Continuous, Lollie Sails, MD, Last Rate: 20 mL/hr at 04/13/15 [phone] .  0.9 %  sodium chloride infusion, , Intravenous, Continuous, Lollie Sails, MD  Prescriptions prior to admission  Medication Sig Dispense Refill Last Dose  . aspirin 325 MG tablet Take 325 mg by mouth daily.   Past Week  . glimepiride (AMARYL) 4 MG tablet Take 4 mg by mouth daily with breakfast.   04/13/2015 at 0600  . lisinopril (PRINIVIL,ZESTRIL) 5 MG tablet Take 5 mg by mouth daily.     . metFORMIN (GLUCOPHAGE) 500 MG tablet Take by mouth 2 (two) times daily with a meal.     . metoprolol succinate (TOPROL-XL) 25 MG 24 hr tablet Take 25 mg by mouth daily.   04/13/2015 at 0600  . oxyCODONE-acetaminophen (PERCOCET/ROXICET) 5-325 MG per tablet Take by mouth every 4 (four) hours as needed for severe pain.     . pantoprazole (PROTONIX) 40 MG tablet Take by mouth daily.   04/12/2015 at 0900  . simvastatin (ZOCOR) 40 MG tablet Take 40 mg by mouth daily.   04/12/2015 at 2200  . vitamin B-12 (CYANOCOBALAMIN) 250 MCG tablet Take 500 mcg by mouth daily.        Allergies  Allergen Reactions  . Lipitor [Atorvastatin]   . Pravachol [Pravastatin Sodium]   . Riomet [Metformin Hcl]   . Vytorin [Ezetimibe-Simvastatin]      Past  Medical History  Diagnosis Date  . Hypertension   . Diabetes mellitus without complication   . Hyperlipidemia   . Coronary artery disease   . neuropathy   . AAA (abdominal aortic aneurysm)   . Low testosterone   . Neuropathy   . Skin cancer   . Adenocarcinoma   . Anemia   . B12 deficiency     Review of systems:      Physical Exam    Heart and lungs: Regular rate and rhythm without rub or gallop, lungs are bilaterally clear    HEENT: Normocephalic atraumatic eyes are anicteric    Other:     Pertinant exam for procedure: Soft nontender nondistended bowel sounds positive normoactive    Planned proceedures: Colonoscopy and indicated procedures I have discussed the risks benefits and complications of procedures to include not limited to bleeding, infection, perforation and the risk of sedation and the patient wishes to proceed.    Lollie Sails, MD Gastroenterology 04/13/2015  9:00 AM

## 2015-04-14 ENCOUNTER — Encounter
Admission: RE | Disposition: A | Discharge status: Home / Self Care | Payer: Self-pay | Source: Ambulatory Visit | Attending: Gastroenterology

## 2015-04-14 ENCOUNTER — Ambulatory Visit: Payer: Commercial Managed Care - HMO | Admitting: Anesthesiology

## 2015-04-14 ENCOUNTER — Encounter: Payer: Self-pay | Admitting: Gastroenterology

## 2015-04-14 ENCOUNTER — Ambulatory Visit
Admission: RE | Admit: 2015-04-14 | Discharge: 2015-04-14 | Disposition: A | Discharge status: Home / Self Care | Payer: Commercial Managed Care - HMO | Source: Ambulatory Visit | Attending: Gastroenterology | Admitting: Gastroenterology

## 2015-04-14 DIAGNOSIS — I714 Abdominal aortic aneurysm, without rupture: Secondary | ICD-10-CM | POA: Diagnosis not present

## 2015-04-14 DIAGNOSIS — K648 Other hemorrhoids: Secondary | ICD-10-CM | POA: Diagnosis not present

## 2015-04-14 DIAGNOSIS — Z85828 Personal history of other malignant neoplasm of skin: Secondary | ICD-10-CM | POA: Diagnosis not present

## 2015-04-14 DIAGNOSIS — Z79899 Other long term (current) drug therapy: Secondary | ICD-10-CM | POA: Insufficient documentation

## 2015-04-14 DIAGNOSIS — Z86010 Personal history of colonic polyps: Secondary | ICD-10-CM | POA: Insufficient documentation

## 2015-04-14 DIAGNOSIS — E538 Deficiency of other specified B group vitamins: Secondary | ICD-10-CM | POA: Insufficient documentation

## 2015-04-14 DIAGNOSIS — E119 Type 2 diabetes mellitus without complications: Secondary | ICD-10-CM | POA: Diagnosis not present

## 2015-04-14 DIAGNOSIS — I2510 Atherosclerotic heart disease of native coronary artery without angina pectoris: Secondary | ICD-10-CM | POA: Insufficient documentation

## 2015-04-14 DIAGNOSIS — Z85038 Personal history of other malignant neoplasm of large intestine: Secondary | ICD-10-CM | POA: Diagnosis not present

## 2015-04-14 DIAGNOSIS — Z87891 Personal history of nicotine dependence: Secondary | ICD-10-CM | POA: Diagnosis not present

## 2015-04-14 DIAGNOSIS — Z7982 Long term (current) use of aspirin: Secondary | ICD-10-CM | POA: Diagnosis not present

## 2015-04-14 DIAGNOSIS — Z980 Intestinal bypass and anastomosis status: Secondary | ICD-10-CM | POA: Insufficient documentation

## 2015-04-14 DIAGNOSIS — K635 Polyp of colon: Secondary | ICD-10-CM | POA: Diagnosis not present

## 2015-04-14 DIAGNOSIS — I10 Essential (primary) hypertension: Secondary | ICD-10-CM | POA: Insufficient documentation

## 2015-04-14 DIAGNOSIS — D120 Benign neoplasm of cecum: Secondary | ICD-10-CM | POA: Insufficient documentation

## 2015-04-14 DIAGNOSIS — D649 Anemia, unspecified: Secondary | ICD-10-CM | POA: Insufficient documentation

## 2015-04-14 DIAGNOSIS — E291 Testicular hypofunction: Secondary | ICD-10-CM | POA: Insufficient documentation

## 2015-04-14 DIAGNOSIS — E785 Hyperlipidemia, unspecified: Secondary | ICD-10-CM | POA: Insufficient documentation

## 2015-04-14 DIAGNOSIS — G629 Polyneuropathy, unspecified: Secondary | ICD-10-CM | POA: Diagnosis not present

## 2015-04-14 DIAGNOSIS — K5730 Diverticulosis of large intestine without perforation or abscess without bleeding: Secondary | ICD-10-CM | POA: Insufficient documentation

## 2015-04-14 DIAGNOSIS — Z888 Allergy status to other drugs, medicaments and biological substances status: Secondary | ICD-10-CM | POA: Insufficient documentation

## 2015-04-14 HISTORY — PX: COLONOSCOPY WITH PROPOFOL: SHX5780

## 2015-04-14 LAB — GLUCOSE, CAPILLARY: GLUCOSE-CAPILLARY: 103 mg/dL — AB (ref 65–99)

## 2015-04-14 SURGERY — COLONOSCOPY WITH PROPOFOL
Anesthesia: General

## 2015-04-14 MED ORDER — LIDOCAINE HCL (PF) 1 % IJ SOLN
2.0000 mL | Freq: Once | INTRAMUSCULAR | Status: AC
  Administered 2015-04-14: 0.3 mL via INTRADERMAL
  Filled 2015-04-14: qty 2

## 2015-04-14 MED ORDER — PROPOFOL 10 MG/ML IV BOLUS
INTRAVENOUS | Status: DC | PRN
  Administered 2015-04-14: 50 mg via INTRAVENOUS

## 2015-04-14 MED ORDER — SODIUM CHLORIDE 0.9 % IV SOLN
INTRAVENOUS | Status: DC
  Administered 2015-04-14: 1000 mL via INTRAVENOUS

## 2015-04-14 MED ORDER — LIDOCAINE HCL (CARDIAC) 20 MG/ML IV SOLN
INTRAVENOUS | Status: DC | PRN
  Administered 2015-04-14: 60 mg via INTRAVENOUS

## 2015-04-14 MED ORDER — SODIUM CHLORIDE 0.9 % IV SOLN: INTRAVENOUS | Status: DC

## 2015-04-14 MED ORDER — LIDOCAINE HCL (PF) 1 % IJ SOLN
INTRAMUSCULAR | Status: AC
  Administered 2015-04-14: 0.3 mL via INTRADERMAL
  Filled 2015-04-14: qty 2

## 2015-04-14 MED ORDER — PHENYLEPHRINE HCL 10 MG/ML IJ SOLN
INTRAMUSCULAR | Status: DC | PRN
  Administered 2015-04-14: 100 ug via INTRAVENOUS

## 2015-04-14 MED ORDER — PROPOFOL INFUSION 10 MG/ML OPTIME
INTRAVENOUS | Status: DC | PRN
  Administered 2015-04-14: 180 ug/kg/min via INTRAVENOUS

## 2015-04-14 NOTE — Anesthesia Preprocedure Evaluation (Signed)
Anesthesia Evaluation  Patient identified by MRN, date of birth, ID band Patient awake    Reviewed: Allergy & Precautions, NPO status , Patient's Chart, lab work & pertinent test results  Airway Mallampati: III  TM Distance: <3 FB Neck ROM: Full    Dental  (+) Partial Upper   Pulmonary former smoker,  breath sounds clear to auscultation  Pulmonary exam normal       Cardiovascular hypertension, + CAD and + Peripheral Vascular Disease Normal cardiovascular exam Hx of AAA   Neuro/Psych Diabetic neoropathy negative psych ROS   GI/Hepatic Neg liver ROS, GERD-  Medicated and Controlled,  Endo/Other  diabetes  Renal/GU negative Renal ROS  negative genitourinary   Musculoskeletal   Abdominal Normal abdominal exam  (+)   Peds negative pediatric ROS (+)  Hematology  (+) anemia ,   Anesthesia Other Findings   Reproductive/Obstetrics                             Anesthesia Physical Anesthesia Plan  ASA: III  Anesthesia Plan: General   Post-op Pain Management:    Induction: Intravenous  Airway Management Planned: Nasal Cannula  Additional Equipment:   Intra-op Plan:   Post-operative Plan: Extubation in OR  Informed Consent: I have reviewed the patients History and Physical, chart, labs and discussed the procedure including the risks, benefits and alternatives for the proposed anesthesia with the patient or authorized representative who has indicated his/her understanding and acceptance.   Dental advisory given  Plan Discussed with: CRNA and Surgeon  Anesthesia Plan Comments:         Anesthesia Quick Evaluation

## 2015-04-14 NOTE — H&P (Signed)
Outpatient short stay form Pre-procedure 04/14/2015 1:39 PM Nicholas Sails MD  Primary Physician: Dr. [name]  Reason for visit:  Colonoscopy  History of present illness:  Patient is a 81-year-old male date personal history of colon cancer and adenomatous colon polyps. He came in yesterday and his prep was not good and required repeat prep last night. He tolerated that well. He stopped taking the 325 mg aspirin 1 week ago. He does not take other anticoagulation medications or other aspirin products.    Current facility-administered medications:  .  0.9 %  sodium chloride infusion, , Intravenous, Continuous, Nicholas Sails, MD, Last Rate: 20 mL/hr at 04/14/15 1330, 1,000 mL at 04/14/15 1330 .  0.9 %  sodium chloride infusion, , Intravenous, Continuous, Nicholas Sails, MD  Prescriptions prior to admission  Medication Sig Dispense Refill Last Dose  . aspirin 325 MG tablet Take 325 mg by mouth daily.   Past Week  . glimepiride (AMARYL) 4 MG tablet Take 4 mg by mouth daily with breakfast.   04/13/2015 at 0600  . lisinopril (PRINIVIL,ZESTRIL) 5 MG tablet Take 5 mg by mouth daily.     . metFORMIN (GLUCOPHAGE) 500 MG tablet Take by mouth 2 (two) times daily with a meal.     . metoprolol succinate (TOPROL-XL) 25 MG 24 hr tablet Take 25 mg by mouth daily.   04/14/2015 at 0700  . oxyCODONE-acetaminophen (PERCOCET/ROXICET) 5-325 MG per tablet Take by mouth every 4 (four) hours as needed for severe pain.     . pantoprazole (PROTONIX) 40 MG tablet Take by mouth daily.   04/12/2015 at 0900  . simvastatin (ZOCOR) 40 MG tablet Take 40 mg by mouth daily.   04/12/2015 at 2200  . vitamin B-12 (CYANOCOBALAMIN) 250 MCG tablet Take 500 mcg by mouth daily.        Allergies  Allergen Reactions  . Lipitor [Atorvastatin]   . Pravachol [Pravastatin Sodium]   . Riomet [Metformin Hcl]   . Vytorin [Ezetimibe-Simvastatin]      Past Medical History  Diagnosis Date  . Hypertension   . Diabetes mellitus  without complication   . Hyperlipidemia   . Coronary artery disease   . neuropathy   . AAA (abdominal aortic aneurysm)   . Low testosterone   . Neuropathy   . Skin cancer   . Adenocarcinoma   . Anemia   . B12 deficiency     Review of systems:      Physical Exam    Heart and lungs: Regular rate and rhythm without rub or gallop, lungs are bilaterally clear    HEENT: Normocephalic atraumatic eyes are anicteric    Other:     Pertinant exam for procedure: Soft nontender nondistended bowel sounds positive and normoactive    Planned proceedures: Colonoscopy and indicated procedures I have discussed the risks benefits and complications of procedures to include not limited to bleeding, infection, perforation and the risk of sedation and the patient wishes to proceed.    Nicholas Sails, MD Gastroenterology 04/14/2015  1:39 PM

## 2015-04-14 NOTE — Transfer of Care (Signed)
Immediate Anesthesia Transfer of Care Note  Patient: Nicholas Parker  Procedure(s) Performed: Procedure(s): COLONOSCOPY WITH PROPOFOL (N/A)  Patient Location: PACU  Anesthesia Type:General  Level of Consciousness: awake, alert  and oriented  Airway & Oxygen Therapy: Patient Spontanous Breathing and Patient connected to nasal cannula oxygen  Post-op Assessment: Report given to RN and Post -op Vital signs reviewed and stable  Post vital signs: Reviewed and stable  Last Vitals:  Filed Vitals:   04/14/15 1423  BP: 102/50  Pulse: 63  Temp: 36.1 C  Resp: 20    Complications: No apparent anesthesia complications

## 2015-04-14 NOTE — Op Note (Signed)
[facility] Gastroenterology Patient Name: Nicholas Parker Procedure Date: 04/14/2015 1:15 PM MRN: [id number] Account #: [id number] Date of Birth: [birth year] Admit Type: Outpatient Age: 81 Room: [facility] ENDO ROOM 1 Gender: Male Note Status: Finalized Procedure:         Colonoscopy Indications:       Personal history of malignant neoplasm of the colon,                     Personal history of colonic polyps Providers:         Lollie Sails, MD Referring MD:      [name], MD (Referring MD) Medicines:         Monitored Anesthesia Care Complications:     No immediate complications. Procedure:         Pre-Anesthesia Assessment:                    - ASA Grade Assessment: III - A patient with severe                     systemic disease.                    After obtaining informed consent, the colonoscope was                     passed under direct vision. Throughout the procedure, the                     patient's blood pressure, pulse, and oxygen saturations                     were monitored continuously. The Colonoscope was                     introduced through the anus and advanced to the the cecum,                     identified by appendiceal orifice and ileocecal valve. The                     colonoscopy was performed without difficulty. The patient                     tolerated the procedure well. The quality of the bowel                     preparation was fair. Findings:      There was evidence of a prior end-to-end colo-colonic anastomosis in the       proximal rectum. This was patent. This was characterized by healthy       appearing mucosa. [Traversed].      A few small-mouthed diverticula were found in the sigmoid colon and in       the distal descending colon.      A 32 mm polypoid lesion was found at the distal ileocecal valve. The       lesion was sessile. No bleeding was present. Biopsies were taken with a       cold forceps for histology. I was  unable to intubate the valve, however,       motility caused the valve to efface, showing a much larger lesion than       that seen from the distal side. This was not repeated.  Non-bleeding internal hemorrhoids were found during retroflexion. The       hemorrhoids were small.      The digital rectal exam was normal. Impression:        - Patent end-to-end colo-colonic anastomosis,                     characterized by healthy appearing mucosa.                    - Diverticulosis in the sigmoid colon and in the distal                     descending colon.                    - Rule out malignancy, tumor at the ileocecal valve.                     Biopsied. Biopsied.                    - Non-bleeding internal hemorrhoids. Recommendation:    - Await pathology results.                    - Return to GI clinic in 1 week. Procedure Code(s): --- Professional ---                    [id number], Colonoscopy, flexible; with biopsy, single or                     multiple Diagnosis Code(s): --- Professional ---                    239.0, Neoplasm of unspecified nature of digestive system                    455.0, Internal hemorrhoids without mention of complication                    V10.05, Personal history of malignant neoplasm of large                     intestine                    V12.72, Personal history of colonic polyps                    V45.3, Intestinal bypass or anastomosis status                    562.10, Diverticulosis of colon (without mention of                     hemorrhage) CPT copyright 2014 American Medical Association. All rights reserved. The codes documented in this report are preliminary and upon coder review may  be revised to meet current compliance requirements. Lollie Sails, MD 04/14/2015 2:24:40 PM This report has been signed electronically. Number of Addenda: 0 Note Initiated On: 04/14/2015 1:15 PM Scope Withdrawal Time: 0 hours 18 minutes 46 seconds  Total  Procedure Duration: 0 hours 27 minutes 34 seconds       [facility]

## 2015-04-14 NOTE — Anesthesia Postprocedure Evaluation (Signed)
  Anesthesia Post-op Note  Patient: Nicholas Parker  Procedure(s) Performed: Procedure(s): COLONOSCOPY WITH PROPOFOL (N/A)  Anesthesia type:General  Patient location: PACU  Post pain: Pain level controlled  Post assessment: Post-op Vital signs reviewed, Patient's Cardiovascular Status Stable, Respiratory Function Stable, Patent Airway and No signs of Nausea or vomiting  Post vital signs: Reviewed and stable  Last Vitals:  Filed Vitals:   04/14/15 1500  BP: 134/59  Pulse: 59  Temp:   Resp: 24    Level of consciousness: awake, alert  and patient cooperative  Complications: No apparent anesthesia complications

## 2015-04-16 ENCOUNTER — Encounter: Payer: Self-pay | Admitting: Gastroenterology

## 2015-04-16 LAB — SURGICAL PATHOLOGY

## 2015-04-21 ENCOUNTER — Other Ambulatory Visit: Payer: Self-pay | Admitting: Gastroenterology

## 2015-04-21 DIAGNOSIS — D126 Benign neoplasm of colon, unspecified: Secondary | ICD-10-CM

## 2015-04-26 ENCOUNTER — Ambulatory Visit
Admission: RE | Admit: 2015-04-26 | Discharge: 2015-04-26 | Disposition: A | Discharge status: Home / Self Care | Payer: Commercial Managed Care - HMO | Source: Ambulatory Visit | Attending: Gastroenterology | Admitting: Gastroenterology

## 2015-04-26 DIAGNOSIS — Z85038 Personal history of other malignant neoplasm of large intestine: Secondary | ICD-10-CM | POA: Diagnosis not present

## 2015-04-26 DIAGNOSIS — D126 Benign neoplasm of colon, unspecified: Secondary | ICD-10-CM | POA: Diagnosis not present

## 2015-04-26 DIAGNOSIS — I714 Abdominal aortic aneurysm, without rupture: Secondary | ICD-10-CM | POA: Diagnosis not present

## 2015-04-26 DIAGNOSIS — K4090 Unilateral inguinal hernia, without obstruction or gangrene, not specified as recurrent: Secondary | ICD-10-CM | POA: Diagnosis not present

## 2015-04-26 MED ORDER — IOHEXOL 300 MG/ML  SOLN
100.0000 mL | Freq: Once | INTRAMUSCULAR | Status: AC | PRN
  Administered 2015-04-26: 100 mL via INTRAVENOUS

## 2015-05-19 DIAGNOSIS — E119 Type 2 diabetes mellitus without complications: Secondary | ICD-10-CM | POA: Diagnosis not present

## 2015-05-19 DIAGNOSIS — E785 Hyperlipidemia, unspecified: Secondary | ICD-10-CM | POA: Diagnosis not present

## 2015-05-19 DIAGNOSIS — I714 Abdominal aortic aneurysm, without rupture: Secondary | ICD-10-CM | POA: Diagnosis not present

## 2015-05-19 DIAGNOSIS — I739 Peripheral vascular disease, unspecified: Secondary | ICD-10-CM | POA: Diagnosis not present

## 2015-05-19 DIAGNOSIS — E669 Obesity, unspecified: Secondary | ICD-10-CM | POA: Diagnosis not present

## 2015-05-19 DIAGNOSIS — I10 Essential (primary) hypertension: Secondary | ICD-10-CM | POA: Diagnosis not present

## 2015-06-10 DIAGNOSIS — D369 Benign neoplasm, unspecified site: Secondary | ICD-10-CM | POA: Diagnosis not present

## 2015-07-20 ENCOUNTER — Ambulatory Visit
Admission: RE | Admit: 2015-07-20 | Discharge: 2015-07-20 | Disposition: A | Discharge status: Home / Self Care | Payer: Commercial Managed Care - HMO | Source: Ambulatory Visit | Attending: Gastroenterology | Admitting: Gastroenterology

## 2015-07-20 ENCOUNTER — Encounter
Admission: RE | Disposition: A | Discharge status: Home / Self Care | Payer: Self-pay | Source: Ambulatory Visit | Attending: Gastroenterology

## 2015-07-20 ENCOUNTER — Encounter: Payer: Self-pay | Admitting: *Deleted

## 2015-07-20 ENCOUNTER — Ambulatory Visit: Payer: Commercial Managed Care - HMO | Admitting: Anesthesiology

## 2015-07-20 DIAGNOSIS — Z87891 Personal history of nicotine dependence: Secondary | ICD-10-CM | POA: Diagnosis not present

## 2015-07-20 DIAGNOSIS — Z980 Intestinal bypass and anastomosis status: Secondary | ICD-10-CM | POA: Insufficient documentation

## 2015-07-20 DIAGNOSIS — D120 Benign neoplasm of cecum: Secondary | ICD-10-CM | POA: Diagnosis not present

## 2015-07-20 DIAGNOSIS — Z9049 Acquired absence of other specified parts of digestive tract: Secondary | ICD-10-CM | POA: Insufficient documentation

## 2015-07-20 DIAGNOSIS — Z7984 Long term (current) use of oral hypoglycemic drugs: Secondary | ICD-10-CM | POA: Diagnosis not present

## 2015-07-20 DIAGNOSIS — K5790 Diverticulosis of intestine, part unspecified, without perforation or abscess without bleeding: Secondary | ICD-10-CM | POA: Diagnosis not present

## 2015-07-20 DIAGNOSIS — I739 Peripheral vascular disease, unspecified: Secondary | ICD-10-CM | POA: Insufficient documentation

## 2015-07-20 DIAGNOSIS — E1140 Type 2 diabetes mellitus with diabetic neuropathy, unspecified: Secondary | ICD-10-CM | POA: Diagnosis not present

## 2015-07-20 DIAGNOSIS — D125 Benign neoplasm of sigmoid colon: Secondary | ICD-10-CM | POA: Diagnosis not present

## 2015-07-20 DIAGNOSIS — I10 Essential (primary) hypertension: Secondary | ICD-10-CM | POA: Diagnosis not present

## 2015-07-20 DIAGNOSIS — Z79899 Other long term (current) drug therapy: Secondary | ICD-10-CM | POA: Diagnosis not present

## 2015-07-20 DIAGNOSIS — Z955 Presence of coronary angioplasty implant and graft: Secondary | ICD-10-CM | POA: Insufficient documentation

## 2015-07-20 DIAGNOSIS — Z1211 Encounter for screening for malignant neoplasm of colon: Secondary | ICD-10-CM | POA: Diagnosis not present

## 2015-07-20 DIAGNOSIS — Z7982 Long term (current) use of aspirin: Secondary | ICD-10-CM | POA: Insufficient documentation

## 2015-07-20 DIAGNOSIS — K5730 Diverticulosis of large intestine without perforation or abscess without bleeding: Secondary | ICD-10-CM | POA: Insufficient documentation

## 2015-07-20 DIAGNOSIS — E538 Deficiency of other specified B group vitamins: Secondary | ICD-10-CM | POA: Insufficient documentation

## 2015-07-20 DIAGNOSIS — G709 Myoneural disorder, unspecified: Secondary | ICD-10-CM | POA: Insufficient documentation

## 2015-07-20 DIAGNOSIS — I2510 Atherosclerotic heart disease of native coronary artery without angina pectoris: Secondary | ICD-10-CM | POA: Diagnosis not present

## 2015-07-20 DIAGNOSIS — Z85038 Personal history of other malignant neoplasm of large intestine: Secondary | ICD-10-CM | POA: Diagnosis not present

## 2015-07-20 DIAGNOSIS — D123 Benign neoplasm of transverse colon: Secondary | ICD-10-CM | POA: Diagnosis not present

## 2015-07-20 DIAGNOSIS — K635 Polyp of colon: Secondary | ICD-10-CM | POA: Diagnosis not present

## 2015-07-20 DIAGNOSIS — Z85828 Personal history of other malignant neoplasm of skin: Secondary | ICD-10-CM | POA: Diagnosis not present

## 2015-07-20 DIAGNOSIS — E785 Hyperlipidemia, unspecified: Secondary | ICD-10-CM | POA: Insufficient documentation

## 2015-07-20 HISTORY — PX: COLONOSCOPY WITH PROPOFOL: SHX5780

## 2015-07-20 LAB — GLUCOSE, CAPILLARY: Glucose-Capillary: 124 mg/dL — ABNORMAL HIGH (ref 65–99)

## 2015-07-20 SURGERY — COLONOSCOPY WITH PROPOFOL
Anesthesia: General

## 2015-07-20 MED ORDER — PROPOFOL 500 MG/50ML IV EMUL
INTRAVENOUS | Status: DC | PRN
Start: 2015-07-20 — End: 2015-07-20
  Administered 2015-07-20: 150 ug/kg/min via INTRAVENOUS

## 2015-07-20 MED ORDER — MIDAZOLAM HCL 5 MG/5ML IJ SOLN
INTRAMUSCULAR | Status: DC | PRN
  Administered 2015-07-20: 1 mg via INTRAVENOUS

## 2015-07-20 MED ORDER — SODIUM CHLORIDE 0.9 % IV SOLN
INTRAVENOUS | Status: DC
  Administered 2015-07-20: 09:00:00 via INTRAVENOUS

## 2015-07-20 MED ORDER — FENTANYL CITRATE (PF) 100 MCG/2ML IJ SOLN
INTRAMUSCULAR | Status: DC | PRN
  Administered 2015-07-20: 50 ug via INTRAVENOUS

## 2015-07-20 MED ORDER — SODIUM CHLORIDE 0.9 % IV SOLN
INTRAVENOUS | Status: DC
  Administered 2015-07-20: 08:00:00 via INTRAVENOUS

## 2015-07-20 NOTE — Anesthesia Preprocedure Evaluation (Signed)
Anesthesia Evaluation  Patient identified by MRN, date of birth, ID band Patient awake    Reviewed: Allergy & Precautions, H&P , NPO status , Patient's Chart, lab work & pertinent test results  History of Anesthesia Complications Negative for: history of anesthetic complications  Airway Mallampati: III  TM Distance: >3 FB Neck ROM: limited    Dental  (+) Poor Dentition, Chipped, Missing, Partial Lower   Pulmonary neg shortness of breath, former smoker,    Pulmonary exam normal breath sounds clear to auscultation       Cardiovascular Exercise Tolerance: Poor hypertension, (-) angina+ CAD, + Cardiac Stents, + Peripheral Vascular Disease and + DOE  Normal cardiovascular exam Rhythm:regular Rate:Normal     Neuro/Psych  Neuromuscular disease negative psych ROS   GI/Hepatic negative GI ROS, Neg liver ROS,   Endo/Other  diabetes, Type 2  Renal/GU negative Renal ROS  negative genitourinary   Musculoskeletal   Abdominal   Peds  Hematology negative hematology ROS (+)   Anesthesia Other Findings Past Medical History:   Hypertension                                                 Diabetes mellitus without complication (HCC)                 Hyperlipidemia                                               Coronary artery disease                                      neuropathy                                                   AAA (abdominal aortic aneurysm) (HCC)                        Low testosterone                                             Neuropathy (HCC)                                             Anemia                                                       B12 deficiency  Skin cancer                                                  Adenocarcinoma [facility])                                        Past Surgical History:   EYE SURGERY                                                      Comment:cataracts   arthrosopic rotator cuff repair                               robotic colectomy                                             CORONARY ANGIOPLASTY                                          VASCULAR SURGERY                                              COLONOSCOPY WITH PROPOFOL                       N/A 04/13/2015      Comment:Procedure: COLONOSCOPY WITH PROPOFOL;  Surgeon:              Lollie Sails, MD;  Location: [facility]               ENDOSCOPY;  Service: Endoscopy;  Laterality:               N/A;   COLONOSCOPY WITH PROPOFOL                       N/A 04/14/2015      Comment:Procedure: COLONOSCOPY WITH PROPOFOL;  Surgeon:              Lollie Sails, MD;  Location: [facility]               ENDOSCOPY;  Service: Endoscopy;  Laterality:               N/A;  BMI    Body Mass Index   28.18 kg/m 2      Reproductive/Obstetrics negative OB ROS                             Anesthesia Physical Anesthesia Plan  ASA: IV  Anesthesia Plan: General   Post-op Pain Management:    Induction:   Airway Management Planned:   Additional Equipment:   Intra-op Plan:   Post-operative Plan:  Informed Consent: I have reviewed the patients History and Physical, chart, labs and discussed the procedure including the risks, benefits and alternatives for the proposed anesthesia with the patient or authorized representative who has indicated his/her understanding and acceptance.   Dental Advisory Given  Plan Discussed with: Anesthesiologist, CRNA and Surgeon  Anesthesia Plan Comments:         Anesthesia Quick Evaluation

## 2015-07-20 NOTE — Anesthesia Postprocedure Evaluation (Signed)
Anesthesia Post Note  Patient: Nicholas Parker  Procedure(s) Performed: Procedure(s) (LRB): COLONOSCOPY WITH PROPOFOL (N/A)  Patient location during evaluation: Endoscopy Anesthesia Type: General Level of consciousness: awake and alert Pain management: pain level controlled Vital Signs Assessment: post-procedure vital signs reviewed and stable Respiratory status: spontaneous breathing, nonlabored ventilation, respiratory function stable and patient connected to nasal cannula oxygen Cardiovascular status: blood pressure returned to baseline and stable Postop Assessment: No signs of nausea or vomiting Anesthetic complications: no    Last Vitals:  Filed Vitals:   07/20/15 1020 07/20/15 1030  BP: 149/65 140/61  Pulse:    Temp:    Resp:      Last Pain:  Filed Vitals:   07/20/15 1031  PainSc: 0-No pain                 Precious Haws [name]

## 2015-07-20 NOTE — Transfer of Care (Signed)
Immediate Anesthesia Transfer of Care Note  Patient: Nicholas Parker  Procedure(s) Performed: Procedure(s): COLONOSCOPY WITH PROPOFOL (N/A)  Patient Location: PACU  Anesthesia Type:General  Level of Consciousness: sedated  Airway & Oxygen Therapy: Patient Spontanous Breathing and Patient connected to nasal cannula oxygen  Post-op Assessment: Report given to RN and Post -op Vital signs reviewed and stable  Post vital signs: Reviewed and stable  Last Vitals:  Filed Vitals:   07/20/15 0800 07/20/15 1000  BP: 136/60 99/48  Pulse: 85 82  Temp: 36.9 C 36.4 C  Resp: 16 26    Complications: No apparent anesthesia complications

## 2015-07-20 NOTE — H&P (Signed)
Outpatient short stay form Pre-procedure 07/20/2015 9:03 AM Lollie Sails MD  Primary Physician: Dr. [name]  Reason for visit:  Colonoscopy  History of present illness:  Patient is a 81 year old male presenting today for repeat colonoscopy. He has a personal history of colon cancer from 2007 and is status post partial colectomy with a and and colonic anastomosis noted in the proximal rectum. He had a polypoid appearing cecal valve and several biopsies were taken these coming back consistent with adenoma. He is returning today for further evaluation and removal of these if possible. He has been off his aspirin for 5 days. He takes no other aspirin or anticoagulation medications.   Current facility-administered medications:  .  0.9 %  sodium chloride infusion, , Intravenous, Continuous, Lollie Sails, MD .  0.9 %  sodium chloride infusion, , Intravenous, Continuous, Lollie Sails, MD, Last Rate: 20 mL/hr at 07/20/15 0818  Prescriptions prior to admission  Medication Sig Dispense Refill Last Dose  . metoprolol succinate (TOPROL-XL) 25 MG 24 hr tablet Take 25 mg by mouth daily.   07/20/2015 at 0530  . aspirin 325 MG tablet Take 325 mg by mouth daily.   07/14/2015  . glimepiride (AMARYL) 4 MG tablet Take 4 mg by mouth daily with breakfast.   04/13/2015 at 0600  . lisinopril (PRINIVIL,ZESTRIL) 5 MG tablet Take 5 mg by mouth daily.     . metFORMIN (GLUCOPHAGE) 500 MG tablet Take by mouth 2 (two) times daily with a meal.   07/18/2015  . oxyCODONE-acetaminophen (PERCOCET/ROXICET) 5-325 MG per tablet Take by mouth every 4 (four) hours as needed for severe pain.     . pantoprazole (PROTONIX) 40 MG tablet Take by mouth daily.   04/12/2015 at 0900  . simvastatin (ZOCOR) 40 MG tablet Take 40 mg by mouth daily.   04/12/2015 at 2200  . vitamin B-12 (CYANOCOBALAMIN) 250 MCG tablet Take 500 mcg by mouth daily.        Allergies  Allergen Reactions  . Lipitor [Atorvastatin]   . Pravachol  [Pravastatin Sodium]   . Riomet [Metformin Hcl]   . Vytorin [Ezetimibe-Simvastatin]      Past Medical History  Diagnosis Date  . Hypertension   . Diabetes mellitus without complication ([place])   . Hyperlipidemia   . Coronary artery disease   . neuropathy   . AAA (abdominal aortic aneurysm) ([place])   . Low testosterone   . Neuropathy ([place])   . Anemia   . B12 deficiency   . Skin cancer   . Adenocarcinoma ([place])     Review of systems:      Physical Exam    Heart and lungs: Irregular rate and rhythm without rub or gallop, lungs are bilaterally clear    HEENT: Normocephalic atraumatic eyes are anicteric    Other:     Pertinant exam for procedure: Soft nontender nondistended bowel sounds positive and normoactive    Planned proceedures: Colonoscopy and indicated procedures. I have discussed the risks benefits and complications of procedures to include not limited to bleeding, infection, perforation and the risk of sedation and the patient wishes to proceed.    Lollie Sails, MD Gastroenterology 07/20/2015  9:03 AM

## 2015-07-20 NOTE — Op Note (Signed)
[facility] Gastroenterology Patient Name: Nicholas Parker Procedure Date: 07/20/2015 9:12 AM MRN: [id number] Account #: [id number] Date of Birth: [birth year] Admit Type: Outpatient Age: 81 Room: [facility] ENDO ROOM 3 Gender: Male Note Status: Finalized Procedure:         Colonoscopy Indications:       Personal history of malignant neoplasm of the colon,                     Personal history of colonic polyps Providers:         Lollie Sails, MD Referring MD:      [name], MD (Referring MD) Medicines:         Monitored Anesthesia Care Complications:     No immediate complications. Procedure:         Pre-Anesthesia Assessment:                    - ASA Grade Assessment: III - A patient with severe                     systemic disease.                    After obtaining informed consent, the colonoscope was                     passed under direct vision. Throughout the procedure, the                     patient's blood pressure, pulse, and oxygen saturations                     were monitored continuously. The Colonoscope was                     introduced through the anus and advanced to the the cecum,                     identified by appendiceal orifice and ileocecal valve. The                     colonoscopy was performed without difficulty. The patient                     tolerated the procedure well. The quality of the bowel                     preparation was fair. Findings:      Multiple small-mouthed diverticula were found in the sigmoid colon and       in the descending colon.      A 4 mm polyp was found in the proximal transverse colon. The polyp was       sessile. The polyp was removed with a cold snare. Resection and       retrieval were complete.      A 2 mm polyp was found in the proximal transverse colon. The polyp was       sessile. The polyp was removed with a cold biopsy forceps. Resection and       retrieval were complete.      A 40 mm polyp was  found at the ileocecal valve. The polyp was       carpet-like, involving much of the distal lip of the valve, going around       the  fold at the valve involving most of the visible valve orifice. This       polyp is only minimally visible from the distal view. Biopsies were       taken with a cold forceps for histology.      A 1 mm polyp was found at the hepatic flexure. The polyp was flat. The       polyp was removed with a cold biopsy forceps. Resection and retrieval       were complete.      The digital rectal exam was normal.      There was evidence of a prior end-to-end colo-colonic anastomosis at 12       cm proximal to the anus. This was patent. This was characterized by       healthy appearing mucosa. Impression:        - Diverticulosis in the sigmoid colon and in the                     descending colon.                    - One 4 mm polyp in the proximal transverse colon.                     Resected and retrieved.                    - One 2 mm polyp in the proximal transverse colon.                     Resected and retrieved.                    - One 40 mm polyp at the ileocecal valve. Biopsied.                    - One 1 mm polyp at the hepatic flexure. Resected and                     retrieved.                    - Patent end-to-end colo-colonic anastomosis,                     characterized by healthy appearing mucosa. Recommendation:    - Await pathology results.                    - Refer to a colo-rectal surgeon at appointment to be                     scheduled. Procedure Code(s): --- Professional ---                    [phone], Colonoscopy, flexible; with removal of tumor(s),                     polyp(s), or other lesion(s) by snare technique                    69450, 69, Colonoscopy, flexible; with biopsy, single or                     multiple Diagnosis Code(s): --- Professional ---  D12.0, Benign neoplasm of cecum                    D12.3,  Benign neoplasm of transverse colon                    Z98.0, Intestinal bypass and anastomosis status                    Z85.038, Personal history of other malignant neoplasm of                     large intestine                    Z86.010, Personal history of colonic polyps                    K57.30, Diverticulosis of large intestine without                     perforation or abscess without bleeding CPT copyright 2014 American Medical Association. All rights reserved. The codes documented in this report are preliminary and upon coder review may  be revised to meet current compliance requirements. Lollie Sails, MD 07/20/2015 9:57:26 AM This report has been signed electronically. Number of Addenda: 0 Note Initiated On: 07/20/2015 9:12 AM Scope Withdrawal Time: 0 hours 16 minutes 25 seconds  Total Procedure Duration: 0 hours 30 minutes 1 second       [facility]

## 2015-07-23 LAB — SURGICAL PATHOLOGY

## 2015-08-05 DIAGNOSIS — Z125 Encounter for screening for malignant neoplasm of prostate: Secondary | ICD-10-CM | POA: Diagnosis not present

## 2015-08-05 DIAGNOSIS — D519 Vitamin B12 deficiency anemia, unspecified: Secondary | ICD-10-CM | POA: Diagnosis not present

## 2015-08-05 DIAGNOSIS — I10 Essential (primary) hypertension: Secondary | ICD-10-CM | POA: Diagnosis not present

## 2015-08-05 DIAGNOSIS — E119 Type 2 diabetes mellitus without complications: Secondary | ICD-10-CM | POA: Diagnosis not present

## 2015-08-11 DIAGNOSIS — E119 Type 2 diabetes mellitus without complications: Secondary | ICD-10-CM | POA: Insufficient documentation

## 2015-08-12 DIAGNOSIS — D6489 Other specified anemias: Secondary | ICD-10-CM | POA: Diagnosis not present

## 2015-08-12 DIAGNOSIS — K219 Gastro-esophageal reflux disease without esophagitis: Secondary | ICD-10-CM | POA: Diagnosis not present

## 2015-08-12 DIAGNOSIS — E782 Mixed hyperlipidemia: Secondary | ICD-10-CM | POA: Diagnosis not present

## 2015-08-12 DIAGNOSIS — R238 Other skin changes: Secondary | ICD-10-CM | POA: Diagnosis not present

## 2015-08-12 DIAGNOSIS — I2510 Atherosclerotic heart disease of native coronary artery without angina pectoris: Secondary | ICD-10-CM | POA: Diagnosis not present

## 2015-08-12 DIAGNOSIS — D518 Other vitamin B12 deficiency anemias: Secondary | ICD-10-CM | POA: Diagnosis not present

## 2015-08-12 DIAGNOSIS — E118 Type 2 diabetes mellitus with unspecified complications: Secondary | ICD-10-CM | POA: Diagnosis not present

## 2015-08-12 DIAGNOSIS — R233 Spontaneous ecchymoses: Secondary | ICD-10-CM | POA: Insufficient documentation

## 2015-08-12 DIAGNOSIS — I10 Essential (primary) hypertension: Secondary | ICD-10-CM | POA: Diagnosis not present

## 2015-08-12 DIAGNOSIS — Z23 Encounter for immunization: Secondary | ICD-10-CM | POA: Diagnosis not present

## 2015-08-12 DIAGNOSIS — G629 Polyneuropathy, unspecified: Secondary | ICD-10-CM | POA: Diagnosis not present

## 2015-08-18 DIAGNOSIS — D122 Benign neoplasm of ascending colon: Secondary | ICD-10-CM | POA: Diagnosis not present

## 2015-09-06 ENCOUNTER — Encounter
Admission: RE | Admit: 2015-09-06 | Discharge: 2015-09-06 | Disposition: A | Discharge status: Home / Self Care | Payer: Commercial Managed Care - HMO | Source: Ambulatory Visit | Attending: Surgery | Admitting: Surgery

## 2015-09-06 ENCOUNTER — Other Ambulatory Visit: Payer: Commercial Managed Care - HMO

## 2015-09-06 DIAGNOSIS — Z01812 Encounter for preprocedural laboratory examination: Secondary | ICD-10-CM | POA: Diagnosis not present

## 2015-09-06 HISTORY — DX: Gastro-esophageal reflux disease without esophagitis: K21.9

## 2015-09-06 HISTORY — DX: Diverticulosis of intestine, part unspecified, without perforation or abscess without bleeding: K57.90

## 2015-09-06 LAB — SURGICAL PCR SCREEN
MRSA, PCR: NEGATIVE
Staphylococcus aureus: NEGATIVE

## 2015-09-06 LAB — TYPE AND SCREEN
ABO/RH(D): A POS
ANTIBODY SCREEN: NEGATIVE

## 2015-09-06 LAB — ABO/RH: ABO/RH(D): A POS

## 2015-09-06 NOTE — Patient Instructions (Signed)
  Your procedure is scheduled on: September 14, 2015 (Tuesday) Report to Day Surgery.[facility]) Second Floorx to find out your arrival time please call [phone] between 1PM - 3PM on September 13, 2015 (Monday).  Remember: Instructions that are not followed completely may result in serious medical risk, up to and including death, or upon the discretion of your surgeon and anesthesiologist your surgery may need to be rescheduled.    __x__ 1. Do not eat food or drink liquids after midnight. No gum chewing or hard candies.     ____ 2. No Alcohol for 24 hours before or after surgery.   ____ 3. Bring all medications with you on the day of surgery if instructed.    __x__ 4. Notify your doctor if there is any change in your medical condition     (cold, fever, infections).     Do not wear jewelry, make-up, hairpins, clips or nail polish.  Do not wear lotions, powders, or perfumes. You may wear deodorant.  Do not shave 48 hours prior to surgery. Men may shave face and neck.  Do not bring valuables to the hospital.    [facility] is not responsible for any belongings or valuables.               Contacts, dentures or bridgework may not be worn into surgery.  Leave your suitcase in the car. After surgery it may be brought to your room.  For patients admitted to the hospital, discharge time is determined by your                treatment team.   Patients discharged the day of surgery will not be allowed to drive home.   Please read over the following fact sheets that you were given:   MRSA Information and Surgical Site Infection Prevention   ____ Take these medicines the morning of surgery with A SIP OF WATER:    1. Lisinopril  2. Metoprolol  3. Pantoprazole (Pantoprazole at bedtime on January 16)  4.Follow Dr, Tamala Julian colon prep instruction sheet  5.  6.  ____ Fleet Enema (as directed)   _x_ Use CHG Soap as directed  ____ Use inhalers on the day of surgery  __x__ Stop metformin 2  days prior to surgery (Stop Metformin on January 15)    ____ Take 1/2 of usual insulin dose the night before surgery and none on the morning of surgery.   __x__ Stop Coumadin/Plavix/aspirin on (Stop Aspirin one week before surgery)  __x__ Stop Anti-inflammatories on (Tylenol ok to take for pain if needed if you do not want to take pain medication) Percocet)   __x__ Stop supplements until after surgery.  (Stop Vitamin B-12 now)  ____ Bring C-Pap to the hospital.

## 2015-09-08 NOTE — Pre-Procedure Instructions (Signed)
Called to [facility] office for clearance note. Spoke with Mardene Celeste

## 2015-09-09 NOTE — Pre-Procedure Instructions (Signed)
GAIL AT DR Nehemiah Massed SAID SHE WILL HAVE TO GET CLEARANCE NOTE AFTER DR Nehemiah Massed RETURNS 09/13/15.EBONY AT DR Tamala Julian NOTIFIED

## 2015-09-14 ENCOUNTER — Encounter: Payer: Self-pay | Admitting: *Deleted

## 2015-09-14 ENCOUNTER — Inpatient Hospital Stay: Payer: Commercial Managed Care - HMO | Admitting: Anesthesiology

## 2015-09-14 ENCOUNTER — Inpatient Hospital Stay
Admission: RE | Admit: 2015-09-14 | Discharge: 2015-09-21 | DRG: 329 | Disposition: A | Discharge status: Home / Self Care | Payer: Commercial Managed Care - HMO | Source: Ambulatory Visit | Attending: Surgery | Admitting: Surgery

## 2015-09-14 ENCOUNTER — Encounter
Admission: RE | Disposition: A | Discharge status: Home / Self Care | Payer: Self-pay | Source: Ambulatory Visit | Attending: Surgery

## 2015-09-14 DIAGNOSIS — I2111 ST elevation (STEMI) myocardial infarction involving right coronary artery: Secondary | ICD-10-CM | POA: Diagnosis not present

## 2015-09-14 DIAGNOSIS — D649 Anemia, unspecified: Secondary | ICD-10-CM | POA: Diagnosis present

## 2015-09-14 DIAGNOSIS — I509 Heart failure, unspecified: Secondary | ICD-10-CM | POA: Diagnosis present

## 2015-09-14 DIAGNOSIS — N170 Acute kidney failure with tubular necrosis: Secondary | ICD-10-CM | POA: Diagnosis not present

## 2015-09-14 DIAGNOSIS — I110 Hypertensive heart disease with heart failure: Secondary | ICD-10-CM | POA: Diagnosis not present

## 2015-09-14 DIAGNOSIS — E1151 Type 2 diabetes mellitus with diabetic peripheral angiopathy without gangrene: Secondary | ICD-10-CM | POA: Diagnosis present

## 2015-09-14 DIAGNOSIS — Z87891 Personal history of nicotine dependence: Secondary | ICD-10-CM

## 2015-09-14 DIAGNOSIS — Z833 Family history of diabetes mellitus: Secondary | ICD-10-CM | POA: Diagnosis not present

## 2015-09-14 DIAGNOSIS — D120 Benign neoplasm of cecum: Principal | ICD-10-CM | POA: Diagnosis present

## 2015-09-14 DIAGNOSIS — Z801 Family history of malignant neoplasm of trachea, bronchus and lung: Secondary | ICD-10-CM | POA: Diagnosis not present

## 2015-09-14 DIAGNOSIS — Z5331 Laparoscopic surgical procedure converted to open procedure: Secondary | ICD-10-CM | POA: Diagnosis not present

## 2015-09-14 DIAGNOSIS — I950 Idiopathic hypotension: Secondary | ICD-10-CM | POA: Diagnosis not present

## 2015-09-14 DIAGNOSIS — I9581 Postprocedural hypotension: Secondary | ICD-10-CM | POA: Diagnosis not present

## 2015-09-14 DIAGNOSIS — Z7984 Long term (current) use of oral hypoglycemic drugs: Secondary | ICD-10-CM

## 2015-09-14 DIAGNOSIS — N179 Acute kidney failure, unspecified: Secondary | ICD-10-CM

## 2015-09-14 DIAGNOSIS — K660 Peritoneal adhesions (postprocedural) (postinfection): Secondary | ICD-10-CM | POA: Diagnosis present

## 2015-09-14 DIAGNOSIS — E782 Mixed hyperlipidemia: Secondary | ICD-10-CM | POA: Diagnosis present

## 2015-09-14 DIAGNOSIS — N281 Cyst of kidney, acquired: Secondary | ICD-10-CM | POA: Diagnosis present

## 2015-09-14 DIAGNOSIS — Z7982 Long term (current) use of aspirin: Secondary | ICD-10-CM

## 2015-09-14 DIAGNOSIS — I2510 Atherosclerotic heart disease of native coronary artery without angina pectoris: Secondary | ICD-10-CM | POA: Diagnosis not present

## 2015-09-14 DIAGNOSIS — K635 Polyp of colon: Secondary | ICD-10-CM | POA: Diagnosis not present

## 2015-09-14 DIAGNOSIS — I2119 ST elevation (STEMI) myocardial infarction involving other coronary artery of inferior wall: Secondary | ICD-10-CM | POA: Diagnosis not present

## 2015-09-14 DIAGNOSIS — Z85828 Personal history of other malignant neoplasm of skin: Secondary | ICD-10-CM | POA: Diagnosis not present

## 2015-09-14 DIAGNOSIS — E1140 Type 2 diabetes mellitus with diabetic neuropathy, unspecified: Secondary | ICD-10-CM | POA: Diagnosis not present

## 2015-09-14 DIAGNOSIS — I2102 ST elevation (STEMI) myocardial infarction involving left anterior descending coronary artery: Secondary | ICD-10-CM | POA: Diagnosis not present

## 2015-09-14 DIAGNOSIS — I4510 Unspecified right bundle-branch block: Secondary | ICD-10-CM | POA: Diagnosis present

## 2015-09-14 DIAGNOSIS — Z85038 Personal history of other malignant neoplasm of large intestine: Secondary | ICD-10-CM

## 2015-09-14 DIAGNOSIS — E119 Type 2 diabetes mellitus without complications: Secondary | ICD-10-CM | POA: Diagnosis not present

## 2015-09-14 DIAGNOSIS — R079 Chest pain, unspecified: Secondary | ICD-10-CM | POA: Diagnosis not present

## 2015-09-14 DIAGNOSIS — E871 Hypo-osmolality and hyponatremia: Secondary | ICD-10-CM | POA: Diagnosis not present

## 2015-09-14 DIAGNOSIS — K219 Gastro-esophageal reflux disease without esophagitis: Secondary | ICD-10-CM | POA: Diagnosis present

## 2015-09-14 DIAGNOSIS — Z794 Long term (current) use of insulin: Secondary | ICD-10-CM

## 2015-09-14 DIAGNOSIS — D519 Vitamin B12 deficiency anemia, unspecified: Secondary | ICD-10-CM | POA: Diagnosis present

## 2015-09-14 DIAGNOSIS — N171 Acute kidney failure with acute cortical necrosis: Secondary | ICD-10-CM | POA: Diagnosis not present

## 2015-09-14 DIAGNOSIS — D126 Benign neoplasm of colon, unspecified: Secondary | ICD-10-CM | POA: Diagnosis not present

## 2015-09-14 DIAGNOSIS — Z8679 Personal history of other diseases of the circulatory system: Secondary | ICD-10-CM

## 2015-09-14 DIAGNOSIS — E1142 Type 2 diabetes mellitus with diabetic polyneuropathy: Secondary | ICD-10-CM | POA: Diagnosis not present

## 2015-09-14 DIAGNOSIS — I739 Peripheral vascular disease, unspecified: Secondary | ICD-10-CM | POA: Diagnosis present

## 2015-09-14 DIAGNOSIS — Z79899 Other long term (current) drug therapy: Secondary | ICD-10-CM | POA: Diagnosis not present

## 2015-09-14 DIAGNOSIS — Z8249 Family history of ischemic heart disease and other diseases of the circulatory system: Secondary | ICD-10-CM | POA: Diagnosis not present

## 2015-09-14 DIAGNOSIS — D122 Benign neoplasm of ascending colon: Secondary | ICD-10-CM | POA: Diagnosis not present

## 2015-09-14 HISTORY — PX: LAPAROSCOPIC RIGHT COLECTOMY: SHX5925

## 2015-09-14 LAB — CREATININE, SERUM
Creatinine, Ser: 1.51 mg/dL — ABNORMAL HIGH (ref 0.61–1.24)
GFR calc non Af Amer: 42 mL/min — ABNORMAL LOW (ref >60)
GFR, EST AFRICAN AMERICAN: 49 mL/min — AB (ref >60)

## 2015-09-14 LAB — GLUCOSE, CAPILLARY
GLUCOSE-CAPILLARY: 135 mg/dL — AB (ref 65–99)
GLUCOSE-CAPILLARY: 193 mg/dL — AB (ref 65–99)

## 2015-09-14 SURGERY — COLECTOMY, RIGHT, LAPAROSCOPIC
Anesthesia: General | Site: Abdomen | Laterality: Right | Wound class: Clean Contaminated

## 2015-09-14 MED ORDER — SUCCINYLCHOLINE CHLORIDE 20 MG/ML IJ SOLN
INTRAMUSCULAR | Status: DC | PRN
  Administered 2015-09-14: 100 mg via INTRAVENOUS

## 2015-09-14 MED ORDER — SODIUM CHLORIDE 0.9 % IV SOLN
INTRAVENOUS | Status: DC
  Administered 2015-09-14 (×2): via INTRAVENOUS

## 2015-09-14 MED ORDER — FENTANYL CITRATE (PF) 100 MCG/2ML IJ SOLN
INTRAMUSCULAR | Status: AC
  Administered 2015-09-14: 25 ug via INTRAVENOUS
  Filled 2015-09-14: qty 2

## 2015-09-14 MED ORDER — DEXTROSE 5 % IV SOLN
2.0000 g | INTRAVENOUS | Status: DC | PRN
  Administered 2015-09-14: 2 g via INTRAVENOUS

## 2015-09-14 MED ORDER — SIMVASTATIN 40 MG PO TABS
40.0000 mg | ORAL_TABLET | Freq: Every day | ORAL | Status: DC
  Administered 2015-09-14 – 2015-09-20 (×7): 40 mg via ORAL
  Filled 2015-09-14 (×8): qty 1

## 2015-09-14 MED ORDER — HYDROMORPHONE HCL 1 MG/ML IJ SOLN
0.2500 mg | INTRAMUSCULAR | Status: DC | PRN
  Administered 2015-09-14 (×4): 0.25 mg via INTRAVENOUS

## 2015-09-14 MED ORDER — SENNA 8.6 MG PO TABS
1.0000 | ORAL_TABLET | Freq: Two times a day (BID) | ORAL | Status: DC
  Administered 2015-09-14 – 2015-09-16 (×4): 8.6 mg via ORAL
  Filled 2015-09-14 (×4): qty 1

## 2015-09-14 MED ORDER — ONDANSETRON HCL 4 MG/2ML IJ SOLN: 4.0000 mg | Freq: Once | INTRAMUSCULAR | Status: DC | PRN

## 2015-09-14 MED ORDER — HYDROMORPHONE HCL 1 MG/ML IJ SOLN
INTRAMUSCULAR | Status: AC
  Administered 2015-09-14: 0.25 mg via INTRAVENOUS
  Filled 2015-09-14: qty 1

## 2015-09-14 MED ORDER — ONDANSETRON HCL 4 MG/2ML IJ SOLN
4.0000 mg | Freq: Four times a day (QID) | INTRAMUSCULAR | Status: DC
  Administered 2015-09-14 – 2015-09-17 (×10): 4 mg via INTRAVENOUS
  Filled 2015-09-14 (×12): qty 2

## 2015-09-14 MED ORDER — SUGAMMADEX SODIUM 500 MG/5ML IV SOLN
INTRAVENOUS | Status: DC | PRN
  Administered 2015-09-14: 163.2 mg via INTRAVENOUS

## 2015-09-14 MED ORDER — PANTOPRAZOLE SODIUM 40 MG PO TBEC
40.0000 mg | DELAYED_RELEASE_TABLET | Freq: Every day | ORAL | Status: DC
  Administered 2015-09-15 – 2015-09-21 (×7): 40 mg via ORAL
  Filled 2015-09-14 (×7): qty 1

## 2015-09-14 MED ORDER — ACETAMINOPHEN 325 MG PO TABS
650.0000 mg | ORAL_TABLET | Freq: Four times a day (QID) | ORAL | Status: DC | PRN
Start: 2015-09-14 — End: 2015-09-21

## 2015-09-14 MED ORDER — OXYCODONE-ACETAMINOPHEN 5-325 MG PO TABS
1.0000 | ORAL_TABLET | ORAL | Status: DC | PRN
Start: 2015-09-14 — End: 2015-09-21
  Administered 2015-09-14: 1 via ORAL
  Administered 2015-09-15 (×2): 2 via ORAL
  Administered 2015-09-15: 1 via ORAL
  Administered 2015-09-15 – 2015-09-16 (×3): 2 via ORAL
  Administered 2015-09-16: 1 via ORAL
  Administered 2015-09-16: 2 via ORAL
  Administered 2015-09-17 – 2015-09-21 (×14): 1 via ORAL
  Filled 2015-09-14 (×4): qty 1
  Filled 2015-09-14: qty 2
  Filled 2015-09-14 (×2): qty 1
  Filled 2015-09-14: qty 2
  Filled 2015-09-14 (×2): qty 1
  Filled 2015-09-14: qty 2
  Filled 2015-09-14 (×2): qty 1
  Filled 2015-09-14 (×2): qty 2
  Filled 2015-09-14 (×5): qty 1
  Filled 2015-09-14 (×2): qty 2
  Filled 2015-09-14: qty 1
  Filled 2015-09-14: qty 2

## 2015-09-14 MED ORDER — EPHEDRINE SULFATE 50 MG/ML IJ SOLN
INTRAMUSCULAR | Status: DC | PRN
  Administered 2015-09-14 (×2): 10 mg via INTRAVENOUS

## 2015-09-14 MED ORDER — LIDOCAINE HCL (CARDIAC) 20 MG/ML IV SOLN
INTRAVENOUS | Status: DC | PRN
  Administered 2015-09-14: 40 mg via INTRAVENOUS

## 2015-09-14 MED ORDER — DEXTROSE 5 % IV SOLN
2.0000 g | Freq: Once | INTRAVENOUS | Status: AC
  Administered 2015-09-14: 2 g via INTRAVENOUS
  Filled 2015-09-14: qty 2

## 2015-09-14 MED ORDER — METOPROLOL SUCCINATE ER 25 MG PO TB24
25.0000 mg | ORAL_TABLET | Freq: Every day | ORAL | Status: DC
  Administered 2015-09-15 – 2015-09-17 (×3): 25 mg via ORAL
  Filled 2015-09-14 (×4): qty 1

## 2015-09-14 MED ORDER — ONDANSETRON HCL 4 MG/2ML IJ SOLN
INTRAMUSCULAR | Status: DC | PRN
  Administered 2015-09-14: 4 mg via INTRAVENOUS

## 2015-09-14 MED ORDER — SODIUM CHLORIDE 0.9 % IJ SOLN
INTRAMUSCULAR | Status: AC
  Filled 2015-09-14: qty 3

## 2015-09-14 MED ORDER — ACETAMINOPHEN 650 MG RE SUPP: 650.0000 mg | Freq: Four times a day (QID) | RECTAL | Status: DC | PRN

## 2015-09-14 MED ORDER — KCL IN DEXTROSE-NACL 20-5-0.2 MEQ/L-%-% IV SOLN
INTRAVENOUS | Status: DC
Start: 2015-09-14 — End: 2015-09-15
  Administered 2015-09-14 – 2015-09-15 (×2): via INTRAVENOUS
  Filled 2015-09-14 (×5): qty 1000

## 2015-09-14 MED ORDER — ROCURONIUM BROMIDE 100 MG/10ML IV SOLN
INTRAVENOUS | Status: DC | PRN
Start: 2015-09-14 — End: 2015-09-14
  Administered 2015-09-14: 30 mg via INTRAVENOUS
  Administered 2015-09-14: 10 mg via INTRAVENOUS
  Administered 2015-09-14: 20 mg via INTRAVENOUS
  Administered 2015-09-14 (×2): 10 mg via INTRAVENOUS

## 2015-09-14 MED ORDER — PROPOFOL 10 MG/ML IV BOLUS
INTRAVENOUS | Status: DC | PRN
  Administered 2015-09-14: 150 mg via INTRAVENOUS

## 2015-09-14 MED ORDER — FENTANYL CITRATE (PF) 100 MCG/2ML IJ SOLN
25.0000 ug | INTRAMUSCULAR | Status: AC | PRN
  Administered 2015-09-14 (×6): 25 ug via INTRAVENOUS

## 2015-09-14 MED ORDER — ENOXAPARIN SODIUM 40 MG/0.4ML ~~LOC~~ SOLN
40.0000 mg | SUBCUTANEOUS | Status: DC
  Administered 2015-09-14 – 2015-09-20 (×7): 40 mg via SUBCUTANEOUS
  Filled 2015-09-14 (×7): qty 0.4

## 2015-09-14 MED ORDER — FENTANYL CITRATE (PF) 250 MCG/5ML IJ SOLN
INTRAMUSCULAR | Status: DC | PRN
  Administered 2015-09-14 (×5): 50 ug via INTRAVENOUS

## 2015-09-14 MED ORDER — LORATADINE 10 MG PO TABS
10.0000 mg | ORAL_TABLET | Freq: Every day | ORAL | Status: DC
  Administered 2015-09-15 – 2015-09-21 (×7): 10 mg via ORAL
  Filled 2015-09-14 (×7): qty 1

## 2015-09-14 MED ORDER — MORPHINE SULFATE (PF) 2 MG/ML IV SOLN
1.0000 mg | INTRAVENOUS | Status: DC | PRN
  Administered 2015-09-14: 1 mg via INTRAVENOUS
  Administered 2015-09-14: 2 mg via INTRAVENOUS
  Administered 2015-09-14: 1 mg via INTRAVENOUS
  Administered 2015-09-14: 2 mg via INTRAVENOUS
  Administered 2015-09-15: 4 mg via INTRAVENOUS
  Administered 2015-09-15 – 2015-09-17 (×4): 2 mg via INTRAVENOUS
  Filled 2015-09-14 (×8): qty 1
  Filled 2015-09-14: qty 2

## 2015-09-14 MED ORDER — LISINOPRIL 5 MG PO TABS
5.0000 mg | ORAL_TABLET | Freq: Every day | ORAL | Status: DC
  Administered 2015-09-15 – 2015-09-16 (×2): 5 mg via ORAL
  Filled 2015-09-14 (×2): qty 1

## 2015-09-14 SURGICAL SUPPLY — 76 items
BLADE BOVIE TIP EXT 4 (BLADE) ×3 IMPLANT
BLADE SURG SZ10 CARB STEEL (BLADE) ×9 IMPLANT
CANISTER SUCT 1200ML W/VALVE (MISCELLANEOUS) ×3 IMPLANT
CANNULA DILATOR 10 W/SLV (CANNULA) ×2 IMPLANT
CANNULA DILATOR 10MM W/SLV (CANNULA) ×1
CATH TRAY 16F METER LATEX (MISCELLANEOUS) IMPLANT
CHLORAPREP W/TINT 26ML (MISCELLANEOUS) ×3 IMPLANT
CLEANER CAUTERY TIP 5X5 PAD (MISCELLANEOUS) IMPLANT
CLIP TI LARGE 6 (CLIP) IMPLANT
CLIP TI MEDIUM 6 (CLIP) IMPLANT
CLOSURE WOUND 1/2 X4 (GAUZE/BANDAGES/DRESSINGS)
DEVICE HAND ACCESS DEXTUS (MISCELLANEOUS) IMPLANT
DRAPE LEGGINS SURG 28X43 STRL (DRAPES) IMPLANT
DRAPE UNDER BUTTOCK W/FLU (DRAPES) IMPLANT
DRSG OPSITE POSTOP 4X10 (GAUZE/BANDAGES/DRESSINGS) IMPLANT
ELECT REM PT RETURN 9FT ADLT (ELECTROSURGICAL) ×3
ELECTRODE REM PT RTRN 9FT ADLT (ELECTROSURGICAL) ×1 IMPLANT
GAUZE SPONGE 4X4 12PLY STRL (GAUZE/BANDAGES/DRESSINGS) ×3 IMPLANT
GLOVE BIO SURGEON STRL SZ7.5 (GLOVE) ×15 IMPLANT
GLOVE SKINSENSE NS SZ8.0 LF (GLOVE) ×2
GLOVE SKINSENSE STRL SZ8.0 LF (GLOVE) ×1 IMPLANT
GOWN STRL REUS W/ TWL LRG LVL3 (GOWN DISPOSABLE) ×8 IMPLANT
GOWN STRL REUS W/ TWL XL LVL3 (GOWN DISPOSABLE) ×1 IMPLANT
GOWN STRL REUS W/TWL LRG LVL3 (GOWN DISPOSABLE) ×16
GOWN STRL REUS W/TWL XL LVL3 (GOWN DISPOSABLE) ×2
HANDLE YANKAUER SUCT BULB TIP (MISCELLANEOUS) ×3 IMPLANT
IRRIGATION STRYKERFLOW (MISCELLANEOUS) IMPLANT
IRRIGATOR STRYKERFLOW (MISCELLANEOUS)
IV NS 1000ML (IV SOLUTION)
IV NS 1000ML BAXH (IV SOLUTION) IMPLANT
KIT RM TURNOVER STRD PROC AR (KITS) ×3 IMPLANT
LABEL OR SOLS (LABEL) IMPLANT
NDL INSUFF ACCESS 14 VERSASTEP (NEEDLE) ×3 IMPLANT
NS IRRIG 500ML POUR BTL (IV SOLUTION) ×3 IMPLANT
PACK COLON CLEAN CLOSURE (MISCELLANEOUS) ×3 IMPLANT
PACK LAP CHOLECYSTECTOMY (MISCELLANEOUS) ×3 IMPLANT
PAD CLEANER CAUTERY TIP 5X5 (MISCELLANEOUS)
PAD PREP 24X41 OB/GYN DISP (PERSONAL CARE ITEMS) IMPLANT
PENCIL ELECTRO HAND CTR (MISCELLANEOUS) ×6 IMPLANT
PROT DEXTUS HAND ACCESS (MISCELLANEOUS)
RETRACT M ACCESS DEXTRUS (MISCELLANEOUS)
RETRACTOR FIXED LENGTH SML (MISCELLANEOUS) IMPLANT
RETRACTOR M ACCESS DEXTRUS (MISCELLANEOUS) IMPLANT
SCISSORS METZENBAUM CVD 33 (INSTRUMENTS) IMPLANT
SEAL FOR SCOPE WARMER C3101 (MISCELLANEOUS) IMPLANT
SHEARS HARMONIC ACE PLUS 36CM (ENDOMECHANICALS) ×3 IMPLANT
SLEEVE ENDOPATH XCEL 5M (ENDOMECHANICALS) ×3 IMPLANT
SOL PREP PVP 2OZ (MISCELLANEOUS)
SOLUTION PREP PVP 2OZ (MISCELLANEOUS) IMPLANT
SPONGE LAP 18X18 5 PK (GAUZE/BANDAGES/DRESSINGS) ×3 IMPLANT
STAPLER PROXIMATE (STAPLE) ×3 IMPLANT
STAPLER PROXIMATE 75MM BLUE (STAPLE) ×3 IMPLANT
STAPLER SKIN PROX 35W (STAPLE) ×3 IMPLANT
STRIP CLOSURE SKIN 1/2X4 (GAUZE/BANDAGES/DRESSINGS) IMPLANT
SURGILUBE 2OZ TUBE FLIPTOP (MISCELLANEOUS) IMPLANT
SUT CHROMIC 0 SH (SUTURE) ×3 IMPLANT
SUT CHROMIC 3 0 SH 27 (SUTURE) ×3 IMPLANT
SUT ETHILON 4-0 (SUTURE) ×4
SUT ETHILON 4-0 FS2 18XMFL BLK (SUTURE) ×2
SUT MAXON ABS #0 GS21 30IN (SUTURE) ×9 IMPLANT
SUT MNCRL AB 4-0 PS2 18 (SUTURE) ×3 IMPLANT
SUT NYLON 2-0 (SUTURE) IMPLANT
SUT PROLENE 3 0 SH DA (SUTURE) IMPLANT
SUT SILK 3-0 (SUTURE) ×3 IMPLANT
SUT VIC AB 2-0 BRD 54 (SUTURE) ×3 IMPLANT
SUT VIC AB 3-0 54X BRD REEL (SUTURE) ×1 IMPLANT
SUT VIC AB 3-0 BRD 54 (SUTURE) ×2
SUT VIC AB 3-0 SH 27 (SUTURE) ×2
SUT VIC AB 3-0 SH 27X BRD (SUTURE) ×1 IMPLANT
SUT VIC AB 5-0 RB1 27 (SUTURE) ×3 IMPLANT
SUTURE ETHLN 4-0 FS2 18XMF BLK (SUTURE) ×2 IMPLANT
TROCAR XCEL NON-BLD 11X100MML (ENDOMECHANICALS) ×3 IMPLANT
TROCAR XCEL NON-BLD 5MMX100MML (ENDOMECHANICALS) ×6 IMPLANT
TROCAR XCEL UNIV SLVE 11M 100M (ENDOMECHANICALS) ×6 IMPLANT
TUBING INSUFFLATOR HEATED (MISCELLANEOUS) ×3 IMPLANT
WATER STERILE IRR 1000ML POUR (IV SOLUTION) IMPLANT

## 2015-09-14 NOTE — Transfer of Care (Signed)
Immediate Anesthesia Transfer of Care Note  Patient: Nicholas Parker  Procedure(s) Performed: Procedure(s): LAPAROSCOPIC RIGHT COLECTOMY  CONVERTED TO OPEN RIGHT COLECTOMY, LYSIS OF ADHESIONS (Right)  Patient Location: PACU  Anesthesia Type:General  Level of Consciousness: awake, alert  and oriented  Airway & Oxygen Therapy: Patient Spontanous Breathing and Patient connected to face mask oxygen  Post-op Assessment: Report given to RN and Post -op Vital signs reviewed and stable  Post vital signs: Reviewed and stable  Last Vitals:  Filed Vitals:   09/14/15 0827 09/14/15 1442  BP: 118/48 146/62  Pulse: 90 100  Temp: 36.9 C 36.2 C  Resp: 14 20    Complications: No apparent anesthesia complications

## 2015-09-14 NOTE — Anesthesia Preprocedure Evaluation (Addendum)
Anesthesia Evaluation  Patient identified by MRN, date of birth, ID band Patient awake    Reviewed: Allergy & Precautions, H&P , NPO status , Patient's Chart, lab work & pertinent test results, reviewed documented beta blocker date and time   Airway Mallampati: III  TM Distance: >3 FB Neck ROM: full    Dental  (+) Teeth Intact, Chipped,    Pulmonary neg pulmonary ROS, former smoker,    Pulmonary exam normal        Cardiovascular Exercise Tolerance: Poor hypertension, (-) angina+ CAD and + Peripheral Vascular Disease  (-) CHF, (-) Orthopnea and (-) PND negative cardio ROS Normal cardiovascular exam Rhythm:regular Rate:Normal  Clearance per cardiology, Dr,[place]  Darrol Angel, MD - 08/12/2015 11:15 AM EST Formatting of this note may be different from the original. Established Patient Visit   Chief Complaint: Chief Complaint  Patient presents with  . Bleeding/Bruising  80mo  . Coronary Artery Disease  Date of Service: 08/12/2015 Date of Birth: 80/22/1935PCP: BCheryll Cockayne MD  History of Present Illness: Mr. MZeekis a 82y.o.male patient  Bruising The patient has had recent complication of anticoagulation and or antiplatelet treatment including bruising for which they are concerned. This has manifested in bruising in the upper extremities and appears benign in nature. Currently there is no active bleeding and the current condition has increased. We have discussed the risks and benefits of all current treatment and the use of antiplatelet and or anticoagulants for their current cardiac conditions which is likely the etiology of bruising. Patient understands and has agreed to continuation of medication Hyperlipidemia The patient currently has a diagnosis of mixed hyperlipidemia. This includes severe LDL cholesterol elevation. They have had treatment with simvastatin (Zocor) which is considered High intensity  cholesterol therapy for reasons including high LDL, coronary artery disease, >7.5% ten year cardiovascular risk score, diabetes and vascular disease. The patient has tolerated this medication at current levels without apparent significant side effects of the medication. We have had a long discussion about treatment goals and risk reduction of cardiovascular disease and therefore have not considered dosage and or medication changes with recent lipid levels HDLc of 43 mg/dl and LDLc of 77 mg/dl  Coronary artery disease The patient has had a diagnosis of coronary artery disease S/P Stenting years ago with current treatment including ACE inhibitors, beta-blocker, statin therapy and ASA with medication side effects. These medications and treatment are helping to modify risk factors including hypertension, diabetes, hyperlipidemia, coronary artery disease and peripheral vascular disease. There has been no current evidence of progression of angina and/or anginal equivalent with treatment listed above. Hypertensive heart disease The patient has had hypertension with other cardiovascular concerns and/or complications. This includes chronic kidney disease, coronary artery disease, peripheral vascular disease and valve disease. Blood pressure readings have been stable at this time. There has been recent side effects to these medications. We have discussed current guidelines for treatment and risk factor modification for which the patient understands and agrees at this time.  Past Medical and Surgical History  Past Medical History Past Medical History  Diagnosis Date  . AAA (abdominal aortic aneurysm)  status post endovascular repair 8/15. Followed by Vascular Surgery  . Adenocarcinoma 2007  incidental finding; of colon routine colonoscopy 2007, status post partial colectomy October 2007 by Dr. RBronson Ing Did not require radiation therapy or chemotherapy. [facility]  . Anemia   . B12 deficiency anemia  . CAD (coronary  artery disease)  with cardiac catherization on 12/28/04 revealing 80% proximal LAD, 70% distal LAD, 80% D1 and 50% D2, 80% proximal RCA, and 70% distal RCA, status post stenting of proximal RCA and proximal LAD, 5/06, followed by Dr. Serafina Royals. The infrarenal abdominal aorta was mildly dilated at the time of his cardiac catherization. Myoview October 2007 revealing normal LV function.  . Diabetes mellitus type 2, uncomplicated 0/96/2836  Type 2  . Diverticulosis 04/16/2015  . Gastroesophageal reflux disease  . Hyperlipidemia  . Hypertension  . Internal hemorrhoids 04/16/2015  . Low testosterone 4/06  235  . Neuropathy  at abdominal incision site, previously evaluated by Pain Clinic.  Marland Kitchen Neuropathy  at abdominal incision site, previously evaluated by Pain Clinic. (revised 03/11/07 BK)  . Rotator cuff tear left  . Skin cancer  Followed by Dr. Sharlett Iles  . Tubular adenoma of colon 04/16/2015   Past Surgical History He has a past surgical history that includes Cataract extraction (Bilateral); arthroscopic rotator cuff repair (Left, 07/2005); robotic colectomy partial (October 2007); Unlisted Procedure Vascular Surgery; Colonoscopy (05/08/2006, 09/11/2007, 10/15/2008, 11/18/2009, 02/20/2012); egd (11/18/2009); Cardiac cath; Upper gastrointestinal endoscopy; Colonoscopy (04/13/2015); Colonoscopy (04/14/2015); and Colonoscopy (07/20/2015).   Medications and Allergies  Current Medications  Current Outpatient Prescriptions  Medication Sig Dispense Refill  . aspirin 81 MG EC tablet Take 81 mg by mouth once daily.  . CYANOCOBALAMIN, VITAMIN B-12, (VITAMIN B-12 ORAL) Take 500 mcg by mouth. One daily  . glimepiride (AMARYL) 4 MG tablet TAKE 1 TABLET IN THE MORNING AND TAKE 1/2 TABLET IN THE EVENING 135 tablet 3  . lisinopril (PRINIVIL,ZESTRIL) 5 MG tablet TAKE 1 TABLET EVERY DAY 90 tablet 3  . loratadine (CLARITIN) 10 mg tablet Take 1 tablet by mouth once  daily.  . metFORMIN (GLUCOPHAGE) 500 MG tablet TAKE 1 TABLET TWICE DAILY 180 tablet 3  . metoprolol succinate (TOPROL-XL) 25 MG XL tablet TAKE 1 TABLET ONE TIME DAILY 90 tablet 2  . ONETOUCH DELICA LANCETS 33 gauge Misc USE ONE TIME DAILY 100 each 3  . ONETOUCH ULTRA TEST test strip USE ONE TIME DAILY 100 each 3  . oxyCODONE-acetaminophen (PERCOCET) 5-325 mg tablet Take 1 tablet by mouth every 8 (eight) hours as needed for Pain. 80 tablet 0  . pantoprazole (PROTONIX) 40 MG DR tablet Take 1 tablet (40 mg total) by mouth once daily. 90 tablet 3  . pneumococcal vaccine 13-valent, PF, (PREVNAR 13) IM syringe Inject 0.5 mLs into the muscle once for 1 dose. 1 each 0  . simvastatin (ZOCOR) 40 MG tablet TAKE 1 TABLET AT NIGHT 90 tablet 3   No current facility-administered medications for this visit.   Allergies: Lipitor (atorvastatin); Pravachol (pravastatin); Riomet (metformin); and Vytorin 10-10 (ezetimibe-simvastatin)  Social and Family History  Social History reports that he quit smoking about 11 years ago. He does not have any smokeless tobacco history on file. He reports that he drinks alcohol. He reports that he does not use illicit drugs.  Family History Family History  Problem Relation Age of Onset  . Coronary artery disease Mother  . Diabetes type II Mother  . Heart attack Mother  . Lung cancer Father  . Glaucoma Other   Review of Systems   Review of Systems  Positive for bruising Negative for weight gain weight loss, weakness, vision change, hearing loss, cough, congestion, PND, orthopnea, heartburn, nausea, diaphoresis, vomiting, diarrhea, melena, stomach pain, extremity pain, leg weakness, leg cramping, leg blood clots, headache, blackouts, nosebleed, trouble swallowing, mouth pain,  urinary frequency, urination at night, muscle weakness, skin lesions tingling ,ulcers, numbness, anxiety, and/or depression Physical Examination   Vitals: Visit Vitals  . BP 120/52 (BP Location:  Left upper arm, Patient Position: Sitting, BP Cuff Size: Adult)  . Pulse 70  . Resp 15  . Ht 166.4 cm (5' 5.5")  . Wt 82.4 kg (181 lb 9.6 oz)  . SpO2 96%  . BMI 29.76 kg/m2   Ht:166.4 cm (5' 5.5") Wt:82.4 kg (181 lb 9.6 oz) WYO:VZCH surface area is 1.95 meters squared. Body mass index is 29.76 kg/(m^2). Appearance: well appearing in no acute distress HEENT: Pupils equally reactive to light and accomodation, no xanthalasma  Neck: Supple, no apparent thyromegaly, masses, or lymphadenopathy  Lungs: normal respiratory effort; no crackles, no rhonchi, no wheezes Heart: irregular rate and rhythm. Normal S1 S2 No gallops, murmur, no rub, PMI is normal size and placement. carotid upstroke normal with bruit. Jugular venous pressure is normal Abdomen: soft, nontender, distended with normal bowel sounds. No apparent hepatosplenomegally. Abdominal aorta is normal size with bruit Extremities: no edema, no ulcers no cyanosis Peripheral Pulses: 2+ in upper extremities, 2+ femoral pulses bilaterally, 2+lower extremity  Musculoskeletal; Normal muscle tone without kyphosis Neurological: Cranial nerves intact, Oriented and Alert  Assessment   82 y.o. male with  Encounter Diagnoses  Name Primary?  . Benign essential hypertension  . Easy bruising Yes  . Coronary artery disease involving native coronary artery of native heart without angina pectoris  . Hyperlipidemia, mixed   Plan  -Continue asa but lower dose to '81mg'$  and aspirin which may contribute to current concerns of bruising. The patient understands all risks and benefits of medication therapy including thrombosis, stroke, bleeding, and infarction and agrees with above -The patient understands all risks of future cardiovascular disease process based on discussion today. We will continue all risk factor modification and prevention including lipid management, exercise and diet, blood pressure control, and anti-platelet medication management as  tolerated. -There has been a discussion of the current guidelines for hypertension control. We will continue current medical regimen for hypertension control which will also help in risk factor modification of cardiovascular disease. The patient understands and agrees with the current plan. We will be watching for possible side effects of these medications. -patient has been instructed on appropriate methods of cardiovascular risk reduction using medication management for lipid lowering. This includes a continued effort to lower LDL cholesterol between 30-50% as achievable. We are watching for any significant side effects of medication management. We have also continued to stress diet, exercise, and lifestyle measures to help as well.  Orders Placed This Encounter  Procedures  . ECG 12-lead  . EXTERNAL COLONOSCOPY RESULT   Return in about 6 months (around 02/10/2016).  Flossie Dibble, MD    Plan of Treatment - as of this encounter Upcoming Encounters Upcoming Encounters Date Type Specialty Care Team Description 09/29/2015 Appointment General Surgery Delrae Alfred., MD  [address]  [place], [place] [address]  [phone]  [phone] (Fax)   02/04/2016 Appointment Lab Cheryll Cockayne, [place]  [facility] [place], [place] [address]  [phone]  [phone] (Fax)   02/11/2016 Appointment Internal Medicine Cheryll Cockayne, MD  [address]  [facility] [place], [place] [address]  [phone]  [phone] (Fax)   02/11/2016 Appointment Cardiology Flossie Dibble, MD  [place]  [facility]  [place], [place]  [address]  [phone]  [phone] (Fax)   EKG Results - in this encounter   ECG 12-lead (08/12/2015 10:48 AM) ECG 12-lead (08/12/2015 10:48 AM) Component Value Ref Range Vent Rate (bpm) 77  PR Interval (msec) 152  QRS  Interval (msec) 132  QT Interval (msec) 410  QTc (msec) 463   ECG 12-lead (08/12/2015 10:48 AM) Specimen Performing Laboratory  [facility] GE MUSE RESULTS   ECG 12-lead (08/12/2015 10:48 AM) Narrative Sinus rhythm with premature supraventricular complexes  Right bundle branch block  T wave abnormality, consider lateral ischemia  Abnormal ECG  No previous ECGs available  I reviewed and concur with this report. Electronically signed [id number] MD, Darnell Level [phone]) on 08/17/2015 12:32:37 PM  Miscellaneous Results - in this encounter   EXTERNAL COLONOSCOPY RESULT (04/14/2015 11:53 AM) EXTERNAL COLONOSCOPY RESULT (04/14/2015 11:53 AM) Narrative Ordered by an unspecified provider.  Visit Diagnoses - in this encounter Diagnosis Easy bruising - Primary Other symptoms involving skin and integumentary tissues  Benign essential hypertension Essential hypertension, benign  Coronary artery disease involving native coronary artery of native heart without angina pectoris Hyperlipidemia, mixed Mixed hyperlipidemia       Neuro/Psych  Neuromuscular disease negative neurological ROS  negative psych ROS   GI/Hepatic negative GI ROS, Neg liver ROS, GERD  ,  Endo/Other  negative endocrine ROSdiabetes  Renal/GU negative Renal ROS  negative genitourinary   Musculoskeletal   Abdominal   Peds  Hematology negative hematology ROS (+) anemia ,   Anesthesia Other Findings Hb 13.5  Reproductive/Obstetrics negative OB ROS                          Anesthesia Physical Anesthesia Plan  ASA: III  Anesthesia Plan: General ETT   Post-op Pain Management:    Induction: Intravenous and Cricoid pressure planned  Airway Management Planned:   Additional Equipment:   Intra-op Plan:   Post-operative Plan:   Informed Consent: I have reviewed the patients History and Physical, chart, labs and discussed the procedure including the risks, benefits and  alternatives for the proposed anesthesia with the patient or authorized representative who has indicated his/her understanding and acceptance.   Dental advisory given  Plan Discussed with: CRNA  Anesthesia Plan Comments:        Anesthesia Quick Evaluation

## 2015-09-14 NOTE — H&P (Signed)
  He has had bowel prep.  No change in condition.  Cardiologist says low cardiac risk. Labs noted.  Abdomen soft and nontender.   Discussed plan for lap right colectomy

## 2015-09-14 NOTE — Op Note (Signed)
OPERATIVE REPORT  PREOPERATIVE  DIAGNOSIS: . Sessile tubular adenoma of the ileocecal valve  POSTOPERATIVE DIAGNOSIS: Sessile tubular adenoma of the ileocecal valve.  PROCEDURE: . Laparoscopy, and open right colectomy, extensive enteroclysis  ANESTHESIA:  General  SURGEON: Rochel Brome  MD   INDICATIONS: . He had recent colonoscopy with findings of a 40 mm sessile tubular adenoma of the ileocecal valve which could not be resected with the colonoscope area surgery was recommended for definitive treatment. He had had a previous open sigmoid colectomy 10 years ago. Multiple adhesions were found during the procedure and it was necessary to convert to an open procedure after laparoscopy and lysis of adhesions and partial mobilization of the right colon.  With the patient on the operating table in the supine position under general endotracheal anesthesia the abdomen was prepared with ChloraPrep and draped in a sterile manner. A small incision was made in the epigastrium and dissection was carried down to the deep fascia which was grasped with a laryngeal hook and elevated. A Veress needle was inserted aspirated and irrigated with a saline solution. The peritoneal cavity was insufflated with carbon dioxide. The Veress needle was removed. The 10 mm cannula was inserted. There were multiple adhesions found within the peritoneal cavity between the omentum and the anterior abdominal wall. A short incision was made in the right lower quadrant to introduce a 5 mm cannula. A short incision was made in the right upper quadrant to introduce a 11 mm cannula. Next multiple adhesions were taken down from the anterior abdominal wall using the Harmonic scalpel. This was an extensive lysis of adhesions. Next additional ports were inserted one below the umbilicus and another in the epigastrium. Mobilization of the right colon was begun with incision of the lateral peritoneal reflection. There were multiple adhesions  involving the terminal ileum and dissection was carried down into the pelvis mobilizing small bowel and it was found that there was extensive adhesions continuing far down into the pelvis there was one small serosal tear. After seeing how extensive the adhesions were a decision was made to convert to an open procedure. The cannulas were removed. The cannula sites in the right lower quadrant and right upper quadrant were repaired with 4-0 nylon vertical mattress sutures.  A midline incision was made from the epigastrium down to the hypogastrium carried down through subcutaneous tissues through the midline fascia and opened the peritoneal cavity. The small serosal tear in the small bowel was repaired with interrupted 5-0 Vicryl. Multiple adhesions were dissected in the pelvis mobilizing small bowel so that the terminal ileum could be mobilized up into the upper abdomen. This was a significant portion of the procedure comprising more than half of the operative time. The right colon was further mobilized with incision of the lateral peritoneal reflection. The location of the middle colic vessels were demonstrated with palpation. The omentum was divided in the midline down to the transverse colon. The right transverse colon was further mobilized the duodenum was identified and the transverse mesocolon was dissected away from the duodenum.  A proximal site for margin of resection was selected some 4 inches proximal to the ileocecal valve and was created in the mesentery. The mesenteric dissection was begun with the Harmonic scalpel. Also window was created in the transverse mesocolon just to the right of the site of the middle colic artery and a window was created in the mesenteric dissection was begun there. The mesenteric dissection was carried continued the ileocolic artery and vein  were suture ligated doubly with 0 chromic and divided with Harmonic scalpel. The small bowel was placed adjacent to the transverse  colon and held with Allis clamps. An enterotomy was made and a colotomy was made. The anastomosis was begun with insertion of the 75 mm GIA stapler to begin the anastomosis along the antimesenteric border. Staple line was hemostatic. The anastomosis was completed with application of the TA 90 stapler which was placed perpendicular to the first staple line engaged and activated and the specimen excised and passed off to a side table. The mesenteric defect was closed with running 3-0 chromic the junction of the staple lines was imbricated with 5-0 Vicryl. Several tiny bleeding points were cauterized. Hemostasis was surgically intact. Some bloody fluid was aspirated from the right colic gutter. Estimated blood loss for the procedure was approximately 150 cc. The omentum was brought underneath the wound.  Gowns and gloves instruments and sheets were exchanged for clean ones. Further. Hemostasis was intact. The midline fascia was closed with interrupted 0 Maxon figure-of-eight sutures. The skin was closed with clips. Dressings were applied with paper tape  The patient tolerated the procedure satisfactorily and was prepared for transfer to the recovery room  This operation was significantly more difficult than the typical case lasting some 4-1/2 hours and requiring conversion to an open procedure. Much of the operative time was spent lysing multiple adhesions which largely appeared to be related to previous surgery.  Rochel Brome M.D.

## 2015-09-14 NOTE — OR Nursing (Signed)
Preoperatively, MD was asked and did not want a foley catheter inserted. MD was asked if patient needed to be straight cath due to the longevity of the surgery and fluids administered and he refused.

## 2015-09-14 NOTE — Progress Notes (Signed)
Foley was inserted per MD order for acute urinary retention. Pt alert and oriented. VSS

## 2015-09-14 NOTE — Plan of Care (Signed)
Problem: Respiratory: Goal: Ability to achieve and maintain a regular respiratory rate will improve Outcome: Progressing Pt is 100% SPO2

## 2015-09-14 NOTE — Anesthesia Procedure Notes (Signed)
Procedure Name: Intubation Date/Time: 09/14/2015 9:48 AM Performed by: Delaney Meigs Pre-anesthesia Checklist: Patient identified, Emergency Drugs available, Suction available, Patient being monitored and Timeout performed Patient Re-evaluated:Patient Re-evaluated prior to inductionOxygen Delivery Method: Circle system utilized Preoxygenation: Pre-oxygenation with 100% oxygen Intubation Type: IV induction Ventilation: Mask ventilation without difficulty Laryngoscope Size: Mac and 3 Grade View: Grade I Tube type: Oral Tube size: 7.5 mm Number of attempts: 1 Airway Equipment and Method: Stylet Placement Confirmation: ETT inserted through vocal cords under direct vision,  positive ETCO2 and breath sounds checked- equal and bilateral Secured at: 22 cm Tube secured with: Tape Dental Injury: Teeth and Oropharynx as per pre-operative assessment

## 2015-09-15 ENCOUNTER — Encounter: Payer: Self-pay | Admitting: Surgery

## 2015-09-15 LAB — BASIC METABOLIC PANEL
ANION GAP: 10 (ref 5–15)
BUN: 21 mg/dL — AB (ref 6–20)
CALCIUM: 8.4 mg/dL — AB (ref 8.9–10.3)
CO2: 15 mmol/L — AB (ref 22–32)
CREATININE: 1.23 mg/dL (ref 0.61–1.24)
Chloride: 110 mmol/L (ref 101–111)
GFR calc Af Amer: >60 mL/min (ref >60)
GFR, EST NON AFRICAN AMERICAN: 54 mL/min — AB (ref >60)
GLUCOSE: 280 mg/dL — AB (ref 65–99)
Potassium: 5.4 mmol/L — ABNORMAL HIGH (ref 3.5–5.1)
Sodium: 135 mmol/L (ref 135–145)

## 2015-09-15 LAB — GLUCOSE, CAPILLARY
GLUCOSE-CAPILLARY: 234 mg/dL — AB (ref 65–99)
GLUCOSE-CAPILLARY: 133 mg/dL — AB (ref 65–99)
Glucose-Capillary: 195 mg/dL — ABNORMAL HIGH (ref 65–99)
Glucose-Capillary: 156 mg/dL — ABNORMAL HIGH (ref 65–99)

## 2015-09-15 LAB — CBC
HCT: 39.4 % — ABNORMAL LOW (ref 40.0–52.0)
HEMOGLOBIN: 12.9 g/dL — AB (ref 13.0–18.0)
MCH: 28.1 pg (ref 26.0–34.0)
MCHC: 32.8 g/dL (ref 32.0–36.0)
MCV: 85.5 fL (ref 80.0–100.0)
PLATELETS: 187 10*3/uL (ref 150–440)
RBC: 4.61 MIL/uL (ref 4.40–5.90)
RDW: 15.5 % — AB (ref 11.5–14.5)
WBC: 13 10*3/uL — ABNORMAL HIGH (ref 3.8–10.6)

## 2015-09-15 MED ORDER — INSULIN ASPART 100 UNIT/ML ~~LOC~~ SOLN: 0.0000 [IU] | Freq: Every day | SUBCUTANEOUS | Status: DC

## 2015-09-15 MED ORDER — INSULIN ASPART 100 UNIT/ML ~~LOC~~ SOLN
0.0000 [IU] | Freq: Three times a day (TID) | SUBCUTANEOUS | Status: DC
  Administered 2015-09-15 (×2): 3 [IU] via SUBCUTANEOUS
  Administered 2015-09-16 (×2): 2 [IU] via SUBCUTANEOUS
  Administered 2015-09-16 – 2015-09-17 (×2): 1 [IU] via SUBCUTANEOUS
  Filled 2015-09-15 (×2): qty 1
  Filled 2015-09-15: qty 2
  Filled 2015-09-15: qty 3
  Filled 2015-09-15: qty 2
  Filled 2015-09-15: qty 3

## 2015-09-15 MED ORDER — DEXTROSE-NACL 5-0.2 % IV SOLN
INTRAVENOUS | Status: DC
  Administered 2015-09-15 – 2015-09-17 (×3): via INTRAVENOUS

## 2015-09-15 NOTE — Progress Notes (Signed)
He reports he is tolerating clear liquids and asking for something else.  He reports no bowel movement or flatus yet.  He has ambulated to a chair but has not yet walked.  Blood sugars are monitored and controlled with insulin.  Plan to advance to a carbohydrate controlled full liquids tomorrow morning.  Walking is encouraged.

## 2015-09-15 NOTE — Progress Notes (Signed)
Patient up in the chair for most of the afternoon, and ambulated around the nurses station. No acute distress noted. Patient alert and oriented no acute distress noted.

## 2015-09-15 NOTE — Progress Notes (Signed)
He reports moderate pain in the incision overnight.  He reports he is breathing satisfactorily.  He is having no nausea.  His urine output is improved overnight has had catheter in overnight  On examination he is awake alert and oriented.  His dressing contain some serosanguineous drainage at the lower end.  A dressing was changed applying a new dry cotton gauze dressing with 2 inch paper tape.  Abdomen is with minimal tenderness.  Lab work with elevated potassium of 5.4, glucose 280, creatinine 1.23 CBC with a white blood count of 13,000, hemoglobin 12.9  Plan to delete potassium in IV, order sliding scale blood sugars, begin carbohydrate control clear liquid diet, ambulating the hallway

## 2015-09-16 LAB — GLUCOSE, CAPILLARY
GLUCOSE-CAPILLARY: 147 mg/dL — AB (ref 65–99)
GLUCOSE-CAPILLARY: 136 mg/dL — AB (ref 65–99)
GLUCOSE-CAPILLARY: 114 mg/dL — AB (ref 65–99)
Glucose-Capillary: 123 mg/dL — ABNORMAL HIGH (ref 65–99)

## 2015-09-16 LAB — SURGICAL PATHOLOGY

## 2015-09-16 LAB — POTASSIUM: Potassium: 4.7 mmol/L (ref 3.5–5.1)

## 2015-09-16 MED ORDER — SENNA 8.6 MG PO TABS
1.0000 | ORAL_TABLET | Freq: Two times a day (BID) | ORAL | Status: DC
  Administered 2015-09-16 – 2015-09-21 (×10): 8.6 mg via ORAL
  Filled 2015-09-16 (×10): qty 1

## 2015-09-16 MED ORDER — SENNA 8.6 MG PO TABS: 2.0000 | ORAL_TABLET | Freq: Two times a day (BID) | ORAL | Status: DC

## 2015-09-16 MED ORDER — METFORMIN HCL 500 MG PO TABS
500.0000 mg | ORAL_TABLET | Freq: Two times a day (BID) | ORAL | Status: DC
  Administered 2015-09-16 – 2015-09-17 (×2): 500 mg via ORAL
  Filled 2015-09-16 (×2): qty 1

## 2015-09-16 MED ORDER — GLIMEPIRIDE 2 MG PO TABS
4.0000 mg | ORAL_TABLET | Freq: Every day | ORAL | Status: DC
  Administered 2015-09-17: 4 mg via ORAL
  Filled 2015-09-16: qty 2
  Filled 2015-09-16: qty 1
  Filled 2015-09-16: qty 2

## 2015-09-16 NOTE — Plan of Care (Signed)
Problem: Pain Management: Goal: General experience of comfort will improve Outcome: Progressing Pt is tolerating PO pain meds.

## 2015-09-16 NOTE — Progress Notes (Signed)
   He reports he is doing some walking in the hallway.  He reports he is tolerating his full liquid diet satisfactorily.  He reports voiding several times today.  His oxygen saturation was recorded at 71% approximately 1:00 this afternoon.  He was placed on nasal cannula and oxygen saturation went up to 100%.  He reports he is now breathing satisfactorily with no shortness of breath.  He has no longer using oxygen but pressure was low as 91/50.  He reports no dizziness  He was ambulated back to the bed.  His dressing was changed his wound appears to be healing satisfactorily.  Abdomen is soft with no significant tenderness.  Ambulated back to the chair  Plan is to continue on full liquid diet continue to monitor blood sugars continue walking in the hallway

## 2015-09-16 NOTE — Clinical Documentation Improvement (Signed)
General Surgery  Can the diagnosis of CKD be further specified documented in pre-op anesthesia report? Please document findings in next progress note; NOT is BPA drop down box. Thanks.   CKD Stage I - GFR greater than or equal to 90  CKD Stage II - GFR 60-89  CKD Stage III - GFR 30-59  CKD Stage IV - GFR 15-29  CKD Stage V - GFR < 15  ESRD (End Stage Renal Disease)  Other condition  Unable to clinically determine  Supporting Information: : (risk factors, signs and symptoms, diagnostics, treatment)  White male  GFR's for current admit running 42 to 54  Please exercise your independent, professional judgment when responding. A specific answer is not anticipated or expected.  Thank You, Zoila Shutter RN, BSN, [place] [phone]; Cell: [phone]

## 2015-09-16 NOTE — Care Management Important Message (Signed)
Important Message  Patient Details  Name: Nicholas Parker MRN: [id number] Date of Birth: 12/18/1933   Medicare Important Message Given:  Yes    Juliann Pulse A Lennon Richins 09/16/2015, 11:19 AM

## 2015-09-16 NOTE — Progress Notes (Signed)
He reports feeling some better today. He has taken full liquids and tolerating that well. He is voiding satisfactorily. He has walked one time in the hallway. He has now sitting up in a chair. He reports no flatus or bowel movement yet. Pain control is improved.  On examination he is awake alert and oriented sitting up in a chair. His dressing is dry. Abdomen with minimal tenderness.  Potassium is normal  Impression satisfactory progress  Plan is to continue with full liquid diet today ambulate in the hallway taper IV

## 2015-09-16 NOTE — Clinical Documentation Improvement (Signed)
General Surgery  Abnormal Lab/Test Results:  Serum Creatine's running 1.51 on admission; dropped to 1.23 this morning. Please document findings in next progress note; NOT in BPA drop down box. Thanks!  Possible Clinical Conditions associated with below indicators  ARF  CKD with stage please  ARF with CKD with stage please  Other Condition  Cannot Clinically Determine  Treatment Provided:  D5 and 0.2% NaCl infusing at 75cc/hr  Please exercise your independent, professional judgment when responding. A specific answer is not anticipated or expected.  Thank You,  Zoila Shutter RN, BSN, [place] [phone]

## 2015-09-17 ENCOUNTER — Encounter: Payer: Self-pay | Admitting: Cardiovascular Disease

## 2015-09-17 ENCOUNTER — Encounter
Admission: RE | Disposition: A | Discharge status: Home / Self Care | Payer: Self-pay | Source: Ambulatory Visit | Attending: Surgery

## 2015-09-17 DIAGNOSIS — I2111 ST elevation (STEMI) myocardial infarction involving right coronary artery: Secondary | ICD-10-CM

## 2015-09-17 DIAGNOSIS — I2510 Atherosclerotic heart disease of native coronary artery without angina pectoris: Secondary | ICD-10-CM

## 2015-09-17 HISTORY — PX: CARDIAC CATHETERIZATION: SHX172

## 2015-09-17 LAB — BASIC METABOLIC PANEL
ANION GAP: 8 (ref 5–15)
BUN: 38 mg/dL — ABNORMAL HIGH (ref 6–20)
CHLORIDE: 96 mmol/L — AB (ref 101–111)
CO2: 21 mmol/L — AB (ref 22–32)
Calcium: 8.1 mg/dL — ABNORMAL LOW (ref 8.9–10.3)
Creatinine, Ser: 2.73 mg/dL — ABNORMAL HIGH (ref 0.61–1.24)
GFR calc non Af Amer: 21 mL/min — ABNORMAL LOW (ref >60)
GFR, EST AFRICAN AMERICAN: 24 mL/min — AB (ref >60)
Glucose, Bld: 138 mg/dL — ABNORMAL HIGH (ref 65–99)
POTASSIUM: 4.2 mmol/L (ref 3.5–5.1)
Sodium: 125 mmol/L — ABNORMAL LOW (ref 135–145)

## 2015-09-17 LAB — GLUCOSE, CAPILLARY
GLUCOSE-CAPILLARY: 107 mg/dL — AB (ref 65–99)
GLUCOSE-CAPILLARY: 122 mg/dL — AB (ref 65–99)
GLUCOSE-CAPILLARY: 126 mg/dL — AB (ref 65–99)
GLUCOSE-CAPILLARY: 235 mg/dL — AB (ref 65–99)
GLUCOSE-CAPILLARY: 51 mg/dL — AB (ref 65–99)
GLUCOSE-CAPILLARY: 77 mg/dL (ref 65–99)
GLUCOSE-CAPILLARY: 78 mg/dL (ref 65–99)
GLUCOSE-CAPILLARY: 88 mg/dL (ref 65–99)
Glucose-Capillary: 129 mg/dL — ABNORMAL HIGH (ref 65–99)
Glucose-Capillary: 36 mg/dL — CL (ref 65–99)
Glucose-Capillary: 37 mg/dL — CL (ref 65–99)
Glucose-Capillary: 57 mg/dL — ABNORMAL LOW (ref 65–99)

## 2015-09-17 LAB — CBC
HEMATOCRIT: 31.1 % — AB (ref 40.0–52.0)
HEMOGLOBIN: 10.1 g/dL — AB (ref 13.0–18.0)
MCH: 27.3 pg (ref 26.0–34.0)
MCHC: 32.5 g/dL (ref 32.0–36.0)
MCV: 83.8 fL (ref 80.0–100.0)
Platelets: 143 10*3/uL — ABNORMAL LOW (ref 150–440)
RBC: 3.71 MIL/uL — AB (ref 4.40–5.90)
RDW: 15.5 % — ABNORMAL HIGH (ref 11.5–14.5)
WBC: 11.7 10*3/uL — AB (ref 3.8–10.6)

## 2015-09-17 LAB — MAGNESIUM: MAGNESIUM: 2 mg/dL (ref 1.7–2.4)

## 2015-09-17 LAB — TROPONIN I
TROPONIN I: 53.37 ng/mL — AB (ref <0.031)
TROPONIN I: 44.19 ng/mL — AB (ref <0.031)

## 2015-09-17 LAB — CK: Total CK: 506 U/L — ABNORMAL HIGH (ref 49–397)

## 2015-09-17 LAB — CREATININE, SERUM
CREATININE: 2.28 mg/dL — AB (ref 0.61–1.24)
GFR calc Af Amer: 30 mL/min — ABNORMAL LOW (ref >60)
GFR calc non Af Amer: 26 mL/min — ABNORMAL LOW (ref >60)

## 2015-09-17 SURGERY — LEFT HEART CATH AND CORONARY ANGIOGRAPHY
Anesthesia: Moderate Sedation

## 2015-09-17 MED ORDER — DEXTROSE 5 % AND 0.9 % NACL IV BOLUS
1000.0000 mL | Freq: Once | INTRAVENOUS | Status: AC
  Administered 2015-09-17: 1000 mL via INTRAVENOUS

## 2015-09-17 MED ORDER — ASPIRIN 81 MG PO CHEW
81.0000 mg | CHEWABLE_TABLET | Freq: Every day | ORAL | Status: DC
  Administered 2015-09-17 – 2015-09-21 (×5): 81 mg via ORAL
  Filled 2015-09-17 (×5): qty 1

## 2015-09-17 MED ORDER — VERAPAMIL HCL 2.5 MG/ML IV SOLN
INTRAVENOUS | Status: AC
  Filled 2015-09-17: qty 2

## 2015-09-17 MED ORDER — HEPARIN SODIUM (PORCINE) 1000 UNIT/ML IJ SOLN
INTRAMUSCULAR | Status: DC | PRN
  Administered 2015-09-17: 5000 [IU] via INTRAVENOUS

## 2015-09-17 MED ORDER — SODIUM CHLORIDE 0.9 % IV BOLUS (SEPSIS)
500.0000 mL | Freq: Once | INTRAVENOUS | Status: AC
  Administered 2015-09-17: 500 mL via INTRAVENOUS

## 2015-09-17 MED ORDER — SODIUM CHLORIDE 0.9 % IV SOLN: 250.0000 mL | INTRAVENOUS | Status: DC | PRN

## 2015-09-17 MED ORDER — SODIUM CHLORIDE 0.9 % IJ SOLN
3.0000 mL | Freq: Two times a day (BID) | INTRAMUSCULAR | Status: DC
  Administered 2015-09-17 – 2015-09-21 (×7): 3 mL via INTRAVENOUS

## 2015-09-17 MED ORDER — FENTANYL CITRATE (PF) 100 MCG/2ML IJ SOLN
INTRAMUSCULAR | Status: AC
  Filled 2015-09-17: qty 2

## 2015-09-17 MED ORDER — NITROGLYCERIN 5 MG/ML IV SOLN
INTRAVENOUS | Status: AC
  Filled 2015-09-17: qty 10

## 2015-09-17 MED ORDER — DEXTROSE-NACL 5-0.9 % IV SOLN
INTRAVENOUS | Status: DC
  Administered 2015-09-17: 12:00:00 via INTRAVENOUS

## 2015-09-17 MED ORDER — CLOPIDOGREL BISULFATE 75 MG PO TABS
ORAL_TABLET | ORAL | Status: AC
  Filled 2015-09-17: qty 8

## 2015-09-17 MED ORDER — SODIUM CHLORIDE 0.9 % IJ SOLN: 3.0000 mL | INTRAMUSCULAR | Status: DC | PRN

## 2015-09-17 MED ORDER — DEXTROSE-NACL 5-0.9 % IV SOLN
INTRAVENOUS | Status: DC
  Administered 2015-09-17: 21:00:00 via INTRAVENOUS

## 2015-09-17 MED ORDER — IOHEXOL 300 MG/ML  SOLN
INTRAMUSCULAR | Status: DC | PRN
  Administered 2015-09-17: 120 mL via INTRA_ARTERIAL

## 2015-09-17 MED ORDER — VERAPAMIL HCL 2.5 MG/ML IV SOLN
INTRAVENOUS | Status: DC | PRN
  Administered 2015-09-17: 2.5 mg via INTRA_ARTERIAL

## 2015-09-17 MED ORDER — CLOPIDOGREL BISULFATE 75 MG PO TABS
ORAL_TABLET | ORAL | Status: DC | PRN
  Administered 2015-09-17: 600 mg via ORAL

## 2015-09-17 MED ORDER — GLUCERNA SHAKE PO LIQD
237.0000 mL | Freq: Three times a day (TID) | ORAL | Status: DC
  Administered 2015-09-19 – 2015-09-20 (×5): 237 mL via ORAL

## 2015-09-17 MED ORDER — POTASSIUM CHLORIDE IN NACL 40-0.9 MEQ/L-% IV SOLN
INTRAVENOUS | Status: DC
  Filled 2015-09-17: qty 1000

## 2015-09-17 MED ORDER — DEXTROSE 50 % IV SOLN
25.0000 mL | Freq: Once | INTRAVENOUS | Status: AC
  Administered 2015-09-17: 25 mL via INTRAVENOUS

## 2015-09-17 MED ORDER — HEPARIN (PORCINE) IN NACL 2-0.9 UNIT/ML-% IJ SOLN
INTRAMUSCULAR | Status: AC
  Filled 2015-09-17: qty 1000

## 2015-09-17 MED ORDER — SODIUM CHLORIDE 0.9 % IV SOLN: INTRAVENOUS | Status: DC

## 2015-09-17 MED ORDER — DEXTROSE 50 % IV SOLN
INTRAVENOUS | Status: AC
  Administered 2015-09-17: 25 mL via INTRAVENOUS
  Filled 2015-09-17: qty 50

## 2015-09-17 MED ORDER — DEXTROSE-NACL 5-0.9 % IV SOLN
INTRAVENOUS | Status: DC
  Administered 2015-09-17: 08:00:00 via INTRAVENOUS

## 2015-09-17 MED ORDER — ASPIRIN 81 MG PO CHEW
CHEWABLE_TABLET | ORAL | Status: DC | PRN
  Administered 2015-09-17: 324 mg via ORAL

## 2015-09-17 MED ORDER — ASPIRIN 81 MG PO CHEW
CHEWABLE_TABLET | ORAL | Status: AC
  Filled 2015-09-17: qty 4

## 2015-09-17 MED ORDER — DEXTROSE 50 % IV SOLN
25.0000 mL | INTRAVENOUS | Status: AC
  Administered 2015-09-17: 25 mL via INTRAVENOUS

## 2015-09-17 MED ORDER — HEPARIN SODIUM (PORCINE) 1000 UNIT/ML IJ SOLN
INTRAMUSCULAR | Status: AC
  Filled 2015-09-17: qty 1

## 2015-09-17 MED ORDER — CLOPIDOGREL BISULFATE 75 MG PO TABS
75.0000 mg | ORAL_TABLET | Freq: Every day | ORAL | Status: DC
  Administered 2015-09-18 – 2015-09-21 (×4): 75 mg via ORAL
  Filled 2015-09-17 (×4): qty 1

## 2015-09-17 MED ORDER — MIDAZOLAM HCL 2 MG/2ML IJ SOLN
INTRAMUSCULAR | Status: AC
  Filled 2015-09-17: qty 2

## 2015-09-17 SURGICAL SUPPLY — 13 items
BALLN ~~LOC~~ TREK RX 4.0X15 (BALLOONS) ×3
BALLOON ~~LOC~~ TREK RX 4.0X15 (BALLOONS) ×1 IMPLANT
CATH OPTITORQUE JACKY 4.0 5F (CATHETERS) ×3 IMPLANT
CATH PRIORITY ONE AC 6F (CATHETERS) ×3 IMPLANT
CATH VISTA GUIDE 6FR JR4 (CATHETERS) ×3 IMPLANT
DEVICE INFLAT 30 PLUS (MISCELLANEOUS) ×3 IMPLANT
DEVICE RAD TR BAND REGULAR (VASCULAR PRODUCTS) ×3 IMPLANT
GLIDESHEATH SLEND SS 6F .021 (SHEATH) ×3 IMPLANT
KIT MANI 3VAL PERCEP (MISCELLANEOUS) ×3 IMPLANT
PACK CARDIAC CATH (CUSTOM PROCEDURE TRAY) ×3 IMPLANT
STENT RESOLUTE INTEG 3.5X30 (Permanent Stent) ×3 IMPLANT
WIRE RUNTHROUGH .014X180CM (WIRE) ×3 IMPLANT
WIRE SAFE-T 1.5MM-J .035X260CM (WIRE) ×3 IMPLANT

## 2015-09-17 NOTE — Progress Notes (Signed)
During a follow-up patient visit this morning I found that his blood pressure was persistently low.  His pulse was irregular.  Lung sounds were clear.  Heart irregular rhythm S1 and S2.  Abdomen soft with minimal tenderness.  He was given another bolus of normal saline.  An EKG was ordered and found to have evidence of acute MI   Dr. Clayborn Bigness was called for cardiology consultation.  He is making plans for cardiac catheterization  He has voided 350 cc of urine  Diagnosis acute myocardial infarction  Plan is for cardiac catheterization.  I discussed with Dr. Clayborn Bigness   that antiplatelet therapy and heparin will be reasonably safe now in the postoperative period.  Continue IV fluids for hydration  This was discussed with the patient and wife

## 2015-09-17 NOTE — Progress Notes (Signed)
E link states that patient is a category 2. E link stated I needed to call surgeon for orders. Dr Jamal Collin on call. He wants me to refer to cardiologist regarding blood pressure. Paging on call Cardiologist.

## 2015-09-17 NOTE — Progress Notes (Signed)
Called E link due to blood pressures. Awaiting a phone call back from physician.

## 2015-09-17 NOTE — Progress Notes (Signed)
°   09/17/15 1100  Clinical Encounter Type  Visited With Patient and family together;Health care provider  Visit Type Initial  Referral From Nurse  Consult/Referral To Chaplain  Spiritual Encounters  Spiritual Needs Emotional  Stress Factors  Patient Stress Factors Health changes  Family Stress Factors Health changes  Chaplain responded to code.  Offered emotional and spiritual support. [place]

## 2015-09-17 NOTE — Progress Notes (Signed)
Contacted Dr. Clayborn Bigness for cardiology consult d/t abnormal EKG results.  Per Dr. Clayborn Bigness orders were given to fax EKG results.  EKG faxed to Dr. Clayborn Bigness with successful transmission.    Will continue to monitor.

## 2015-09-17 NOTE — Clinical Documentation Improvement (Signed)
General Surgery  Abnormal Lab/Test Results:  Serum Creatinine was 125 this morning. Treatment plan listed below. Please provide a diagnosis associated with the abnormal lab value and treatment provided. Document findings in next progress note; NOT in BPA drop down box. Thanks!  Possible Clinical Conditions associated with below indicators   Other Condition  Cannot Clinically Determine  Supporting Information:  sodium 125  Treatment Provided:  Plan is to give a bolus of normal saline and continued IV rate at 1 25 cc/h  Please exercise your independent, professional judgment when responding. A specific answer is not anticipated or expected.  Thank You,  Zoila Shutter RN, BSN, [place] [phone]; Cell: [phone]

## 2015-09-17 NOTE — Progress Notes (Signed)
Hypoglycemic Event  CBG: 36  Treatment: D50 IV 25 mL  Symptoms: Pale, Sweaty and Shaky  Follow-up CBG: Time:1530 CBG Result:70  Possible Reasons for Event: Unknown  Comments/MD notified:Pt had late tray delivered after second blood sugar of 77 taken.     K[place]

## 2015-09-17 NOTE — Progress Notes (Signed)
Called E link due to patients soft blood pressures. Awaiting a call back from E link physician.

## 2015-09-17 NOTE — Anesthesia Postprocedure Evaluation (Signed)
Anesthesia Post Note  Patient: JOHNEL YIELDING  Procedure(s) Performed: Procedure(s) (LRB): LAPAROSCOPIC RIGHT COLECTOMY  CONVERTED TO OPEN RIGHT COLECTOMY, LYSIS OF ADHESIONS (Right)  Patient location during evaluation: PACU Anesthesia Type: General Level of consciousness: awake and alert Pain management: pain level controlled Vital Signs Assessment: post-procedure vital signs reviewed and stable Respiratory status: spontaneous breathing, nonlabored ventilation, respiratory function stable and patient connected to nasal cannula oxygen Cardiovascular status: blood pressure returned to baseline and stable Postop Assessment: no signs of nausea or vomiting Anesthetic complications: no    Last Vitals:  Filed Vitals:   09/17/15 1415 09/17/15 1430  BP:  99/57  Pulse: 96 65  Temp:    Resp: 23 22    Last Pain:  Filed Vitals:   09/17/15 1439  PainSc: 0-No pain                 Molli Barrows

## 2015-09-17 NOTE — Progress Notes (Signed)
He reports low urine output. He reports his breathing satisfactorily. He has been drinking some water this morning. He had not received Fransisco Beau astray by time of surgical evaluation. He reports no difficulty breathing. He has been walking in the hallway last night. He received some morphine earlier this morning for abdominal pain. Pain was improved by the time of surgical evaluation. Blood pressures have been low.  On examination he is awake alert and oriented and appears to be in no acute distress. Lung sounds are clear. Abdomen is soft with minimal tenderness. Dressing is dry.  Creatinine this morning is 2.7, sodium 125, white blood count 11,700, hemoglobin 10.1  Impression probable dehydration  Plan is to give a bolus of normal saline and continued IV rate at 1 25 cc/h. Continue full liquid diet. Continue ambulating in hallway. Monitor urine output

## 2015-09-17 NOTE — Progress Notes (Signed)
CRITICAL VALUE ALERT  Critical value received:  Troponin of 53.37  Date of notification:  09/17/2015  Time of notification:  5749  Critical value read back:Yes.    Nurse who received alert:  Kathi Simpers, RN  MD notified (1st page):  Dr. Fritzi Mandes  Time of first page:  1543  MD notified (2nd page):  Time of second page:  Responding MD:  Dr. Fritzi Mandes  Time MD responded:  [phone]  Lab acknowledge. Order received to recheck Troponin Q6 hrs for 2 more draws to check to see if Trending down.  Update: Dr. Fritzi Mandes stated she informed Dr. Fletcher Anon of pt's Troponin.

## 2015-09-17 NOTE — Progress Notes (Signed)
Pt received EKG per MD order. Md wanted to transport pt down to cath lab to receive cath. Nurse and NT transported down with pt.

## 2015-09-17 NOTE — Progress Notes (Signed)
Dr. Jamal Collin notified of pt BP and low urine output, bladder scan results. Sankar states dr. Tamala Julian will follow up with pt during his rounds today.

## 2015-09-17 NOTE — Consult Note (Signed)
[facility] CONSULT NOTE    Date: 09/17/2015                  Patient Name:  Nicholas Parker  MRN: [id number]  DOB: 07/26/1934  Age / Sex: 82 y.o., male         PCP: Adin Hector, MD                 Service Requesting Consult: Dr. Fritzi Mandes                 Reason for Consult: Acute renal failure            History of Present Illness: Patient is a 82 y.o. male with a PMHx of hypertension, diabetes mellitus type 2, hyperlipidemia, coronary disease status post stent placement 2, peripheral neuropathy, anemia, B12 deficiency, GERD, history of basal cell carcinoma, diverticulosis, who was admitted to [facility] on 09/14/2015 for evaluation of tubular adenoma status post right open colectomy on 09/14/2015.  Postoperative course was complicated by hypotension and he has now developed a myocardial infarction. As part of the evaluation and treatment of the myocardial infarction the patient had cardiac catheterization and received intravenous contrast dye.  We are now consult it for the evaluation management of acute renal failure. His baseline creatinine is 1.0. Creatinine is currently up to 2.73. Patient is producing urine at this point in time.   Medications: Outpatient medications: Prescriptions prior to admission  Medication Sig Dispense Refill Last Dose  . lisinopril (PRINIVIL,ZESTRIL) 5 MG tablet Take 5 mg by mouth daily.   09/14/2015 at 0630  . loratadine (CLARITIN) 10 MG tablet Take 10 mg by mouth daily.   Past Week at Unknown time  . metoprolol succinate (TOPROL-XL) 25 MG 24 hr tablet Take 25 mg by mouth daily.   09/14/2015 at 0630  . oxyCODONE-acetaminophen (PERCOCET/ROXICET) 5-325 MG per tablet Take by mouth every 4 (four) hours as needed for severe pain.   Past Week at Unknown time  . pantoprazole (PROTONIX) 40 MG tablet Take 40 mg by mouth daily.    09/14/2015 at 0630  . vitamin B-12 (CYANOCOBALAMIN) 250 MCG tablet Take 500 mcg by mouth daily.   Past Week at  Unknown time  . aspirin EC 81 MG tablet Take 81 mg by mouth daily.   09/05/2015  . glimepiride (AMARYL) 4 MG tablet Take 4 mg by mouth daily with breakfast.   09/11/2015  . metFORMIN (GLUCOPHAGE) 500 MG tablet Take 500 mg by mouth 2 (two) times daily with a meal.    09/11/2015  . simvastatin (ZOCOR) 40 MG tablet Take 40 mg by mouth daily at 6 PM.    09/12/2015    Current medications: Current Facility-Administered Medications  Medication Dose Route Frequency Provider Last Rate Last Dose  . 0.9 %  sodium chloride infusion  250 mL Intravenous PRN Wellington Hampshire, MD      . 0.9 %  sodium chloride infusion   Intravenous Continuous Wellington Hampshire, MD      . acetaminophen (TYLENOL) tablet 650 mg  650 mg Oral Q6H PRN Leonie Green, MD       Or  . acetaminophen (TYLENOL) suppository 650 mg  650 mg Rectal Q6H PRN Leonie Green, MD      . aspirin chewable tablet 81 mg  81 mg Oral Daily Wellington Hampshire, MD   81 mg at 09/17/15 1523  . [START ON 09/18/2015] clopidogrel (PLAVIX) tablet 75  mg  75 mg Oral Q breakfast Wellington Hampshire, MD      . enoxaparin (LOVENOX) injection 40 mg  40 mg Subcutaneous Q24H Leonie Green, MD   40 mg at 09/16/15 2128  . glimepiride (AMARYL) tablet 4 mg  4 mg Oral Q breakfast Leonie Green, MD   4 mg at 09/17/15 0755  . insulin aspart (novoLOG) injection 0-15 Units  0-15 Units Subcutaneous TID WC Leonie Green, MD   1 Units at 09/17/15 [id number]  . insulin aspart (novoLOG) injection 0-5 Units  0-5 Units Subcutaneous QHS Leonie Green, MD   0 Units at 09/15/15 2232  . loratadine (CLARITIN) tablet 10 mg  10 mg Oral Daily Leonie Green, MD   10 mg at 09/17/15 1046  . metoprolol succinate (TOPROL-XL) 24 hr tablet 25 mg  25 mg Oral Daily Leonie Green, MD   25 mg at 09/17/15 1046  . morphine 2 MG/ML injection 1-4 mg  1-4 mg Intravenous Q1H PRN Leonie Green, MD   2 mg at 09/17/15 0109  . ondansetron (ZOFRAN) injection 4 mg  4 mg  Intravenous 4 times per day Leonie Green, MD   4 mg at 09/17/15 3235  . oxyCODONE-acetaminophen (PERCOCET/ROXICET) 5-325 MG per tablet 1-2 tablet  1-2 tablet Oral Q4H PRN Leonie Green, MD   1 tablet at 09/17/15 0340  . pantoprazole (PROTONIX) EC tablet 40 mg  40 mg Oral QAC breakfast Leonie Green, MD   40 mg at 09/17/15 0755  . senna (SENOKOT) tablet 8.6 mg  1 tablet Oral BID Leonie Green, MD   8.6 mg at 09/17/15 1046  . simvastatin (ZOCOR) tablet 40 mg  40 mg Oral q1800 Leonie Green, MD   40 mg at 09/16/15 1717  . sodium chloride 0.9 % injection 3 mL  3 mL Intravenous Q12H Wellington Hampshire, MD      . sodium chloride 0.9 % injection 3 mL  3 mL Intravenous PRN Wellington Hampshire, MD          Allergies: Allergies  Allergen Reactions  . Lipitor [Atorvastatin] Other (See Comments)    "leg cramps"  . Pravachol [Pravastatin Sodium] Other (See Comments)    "leg cramps"  . Riomet [Metformin Hcl] Diarrhea  . Vytorin [Ezetimibe-Simvastatin] Other (See Comments)    "leg cramps"      Past Medical History: Past Medical History  Diagnosis Date  . Hypertension   . Diabetes mellitus without complication ([place])   . Hyperlipidemia   . Coronary artery disease   . neuropathy   . Low testosterone   . Neuropathy ([place])   . Anemia   . B12 deficiency   . GERD (gastroesophageal reflux disease)   . Skin cancer     Basal Cell  . Adenocarcinoma ([place])   . AAA (abdominal aortic aneurysm) ([place])   . Diverticulosis      Past Surgical History: Past Surgical History  Procedure Laterality Date  . Arthrosopic rotator cuff repair Left   . Robotic colectomy    . Coronary angioplasty    . Vascular surgery    . Colonoscopy with propofol N/A 04/13/2015    Procedure: COLONOSCOPY WITH PROPOFOL;  Surgeon: Lollie Sails, MD;  Location: [facility] ENDOSCOPY;  Service: Endoscopy;  Laterality: N/A;  . Colonoscopy with propofol N/A 04/14/2015    Procedure: COLONOSCOPY WITH PROPOFOL;   Surgeon: Lollie Sails, MD;  Location: [facility] ENDOSCOPY;  Service: Endoscopy;  Laterality:  N/A;  . Colonoscopy with propofol N/A 07/20/2015    Procedure: COLONOSCOPY WITH PROPOFOL;  Surgeon: Lollie Sails, MD;  Location: [facility] ENDOSCOPY;  Service: Endoscopy;  Laterality: N/A;  . Colon surgery    . Eye surgery Bilateral     Cataract Extraction with IOL  . Abdominal aortic aneurysm repair  2014    Dr. Delana Meyer, [facility]  . Laparoscopic right colectomy Right 09/14/2015    Procedure: LAPAROSCOPIC RIGHT COLECTOMY  CONVERTED TO OPEN RIGHT COLECTOMY, LYSIS OF ADHESIONS;  Surgeon: Leonie Green, MD;  Location: [facility] ORS;  Service: General;  Laterality: Right;  . Cardiac catheterization N/A 09/17/2015    Procedure: Left Heart Cath and Coronary Angiography;  Surgeon: Wellington Hampshire, MD;  Location: [place] CV LAB;  Service: Cardiovascular;  Laterality: N/A;  . Cardiac catheterization N/A 09/17/2015    Procedure: Coronary Stent Intervention;  Surgeon: Wellington Hampshire, MD;  Location: [place] CV LAB;  Service: Cardiovascular;  Laterality: N/A;     Family History: History reviewed. No pertinent family history.   Social History: Social History   Social History  . Marital Status: Married    Spouse Name: N/A  . Number of Children: N/A  . Years of Education: N/A   Occupational History  . Not on file.   Social History Main Topics  . Smoking status: Former Smoker -- 1.50 packs/day    Types: Cigarettes    Quit date: 10/03/2003  . Smokeless tobacco: Never Used  . Alcohol Use: 0.6 oz/week    1 Cans of beer per week  . Drug Use: No  . Sexual Activity: Not on file   Other Topics Concern  . Not on file   Social History Narrative     Review of Systems: Review of Systems  Constitutional: Negative for fever, chills, weight loss and malaise/fatigue.  HENT: Negative for ear discharge and ear pain.   Eyes: Negative for blurred vision and double vision.  Respiratory: Negative  for cough, hemoptysis and sputum production.   Cardiovascular: Negative for chest pain, palpitations and orthopnea.  Gastrointestinal: Positive for nausea and abdominal pain. Negative for heartburn.  Genitourinary: Negative for dysuria and urgency.  Musculoskeletal: Negative for myalgias and neck pain.  Skin: Negative for itching and rash.  Neurological: Negative for dizziness, focal weakness and headaches.  Endo/Heme/Allergies: Negative for polydipsia. Does not bruise/bleed easily.  Psychiatric/Behavioral: Negative for depression. The patient is not nervous/anxious.      Vital Signs: Blood pressure 99/57, pulse 65, temperature 98.5 F (36.9 C), temperature source Oral, resp. rate 22, height '5\' 6"'$  (1.676 m), weight 81.647 kg (180 lb), SpO2 95 %.  Weight trends: Filed Weights   09/14/15 0827  Weight: 81.647 kg (180 lb)    Physical Exam: General: NAD, resting in bed  Head: Normocephalic, atraumatic.  Eyes: Anicteric, EOMI  Nose: Mucous membranes moist, not inflammed, nonerythematous.  Throat: Oropharynx nonerythematous, no exudate appreciated.   Neck: Supple, trachea midline.  Lungs:  Normal respiratory effort. Clear to auscultation BL without crackles or wheezes.  Heart: RRR. S1 and S2 normal without gallop, murmur, or rubs.  Abdomen:  Abdominal incision covered, hypoactive BS.  Extremities: No pretibial edema.  Neurologic: A&O X3, Motor strength is 5/5 in the all 4 extremities  Skin: No visible rashes, scars.    Lab results: Basic Metabolic Panel:  Recent Labs Lab 09/14/15 1635 09/15/15 0601 09/16/15 0540 09/17/15 0419 09/17/15 1446  NA  --  135  --  125*  --  K  --  5.4* 4.7 4.2  --   CL  --  110  --  96*  --   CO2  --  15*  --  21*  --   GLUCOSE  --  280*  --  138*  --   BUN  --  21*  --  38*  --   CREATININE 1.51* 1.23  --  2.73*  --   CALCIUM  --  8.4*  --  8.1*  --   MG  --   --   --   --  2.0    Liver Function Tests: No results for input(s): AST, ALT,  ALKPHOS, BILITOT, PROT, ALBUMIN in the last 168 hours. No results for input(s): LIPASE, AMYLASE in the last 168 hours. No results for input(s): AMMONIA in the last 168 hours.  CBC:  Recent Labs Lab 09/15/15 0601 09/17/15 0419  WBC 13.0* 11.7*  HGB 12.9* 10.1*  HCT 39.4* 31.1*  MCV 85.5 83.8  PLT 187 143*    Cardiac Enzymes: No results for input(s): CKTOTAL, CKMB, CKMBINDEX, TROPONINI in the last 168 hours.  BNP: Invalid input(s): POCBNP  CBG:  Recent Labs Lab 09/17/15 0559 09/17/15 0735 09/17/15 1128 09/17/15 1502 09/17/15 1504  GLUCAP 129* 122* 235* 8* 89*    Microbiology: Results for orders placed or performed during the hospital encounter of 09/06/15  Surgical pcr screen     Status: None   Collection Time: 09/06/15  2:40 PM  Result Value Ref Range Status   MRSA, PCR NEGATIVE NEGATIVE Final   Staphylococcus aureus NEGATIVE NEGATIVE Final    Comment:        The Xpert SA Assay (FDA approved for NASAL specimens in patients over 58 years of age), is one component of a comprehensive surveillance program.  Test performance has been validated by [facility] for patients greater than or equal to 71 year old. It is not intended to diagnose infection nor to guide or monitor treatment.     Coagulation Studies: No results for input(s): LABPROT, INR in the last 72 hours.  Urinalysis: No results for input(s): COLORURINE, LABSPEC, PHURINE, GLUCOSEU, HGBUR, BILIRUBINUR, KETONESUR, PROTEINUR, UROBILINOGEN, NITRITE, LEUKOCYTESUR in the last 72 hours.  Invalid input(s): APPERANCEUR    Imaging:  No results found.   Assessment & Plan: Pt is a 81 y.o. male  with a PMHx of hypertension, diabetes mellitus type 2, hyperlipidemia, coronary disease status post stent placement 2, peripheral neuropathy, anemia, B12 deficiency, GERD, history of basal cell carcinoma, diverticulosis, who was admitted to [facility] on 09/14/2015 for evaluation of tubular adenoma status post right  open colectomy on 09/14/2015. Hospital course complicated by post operative hypotension and acute myocardial infarction.    1.  Acute renal failure due to ATN:  Pt hypotensive post operative, also received contrast dye this admission. -  Patient has acute tubular necrosis as a cause of his acute renal failure. Baseline creatinine 1.0. We will check renal ultrasound to make sure there is no underlying obstruction. Continue IV fluid hydration. I discussed with the patient that there is some chance that there may be some further deterioration of renal function in which case we may need to consider renal replacement therapy.  2. Hyponatremia. Likely related to postsurgical losses. Continue IV fluid hydration with 0.9 normal saline for now and continue to monitor serum sodium.  3. Anemia unspecified.  Hemoglobin down to 10.1 postoperatively. Continue to monitor hemoglobin. Transfuse as necessary for hemoglobin of 7 or less or  as under the instruction of cardiology given the acute myocardial infarction.  4. Thank you for consultation.

## 2015-09-17 NOTE — Progress Notes (Signed)
His 3 days after laparoscopy and open right colectomy with extensive enterolysis. He has had postoperative hypotension and oliguria with findings of acute inferior myocardial infarction and acute renal failure. He has had cardiac catheterization with insertion of a drug-eluting stent in the right coronary artery. He has been transferred to the intensive care unit  At present he reports no abdominal pain. He has been tolerating his full liquids. He has not yet passed gas or moved his bowels. Urine output is much improved.  Examination: He is awake alert and oriented and in no acute distress. His dressing is dry. Abdomen is mildly distended and soft with no significant tenderness.  Plan is to continue with full liquids and add Glucerna for nutritional support

## 2015-09-17 NOTE — Progress Notes (Signed)
Abnormal EKG results reported by RT. Called MD and received order to put in consult for cardiology. Consult called. Awaiting arrival of cardiologist.

## 2015-09-17 NOTE — Consult Note (Signed)
[place] [facility] [place] NAME: Nicholas Parker    MR#:  [id number]  DATE OF BIRTH:  15-Aug-1934  DATE OF ADMISSION:  09/14/2015  PRIMARY CARE PHYSICIAN: Tama High III, MD   REQUESTING/REFERRING PHYSICIAN:  Dr. Rochel Brome  CHIEF COMPLAINT:  Low blood pressure low urine output postop day 3 exploratory laparotomy.  HISTORY OF PRESENT ILLNESS:  Nicholas Parker  is a 82 y.o. male with a known history of coronary artery disease status post stent 2 in 2006, hypertension, type 2 diabetes, GERD, AAA, was recently diagnosed to have tubular adenoma in his cecal area which was not removable with colonoscopy. Decision was made for patient to undergo surgical removal.   patient underwent laparoscopy and open right colectomy with extensive enterocleisis on 09/14/2015. Postop he was relatively better however he continued to have low blood pressure and creatinine was trending up. His baseline creatinine is 1.0 in December 2016. Creatinine today is 2.73. Patient denied any chest pain EKG was done this morning and showed acute inferior wall MI. Discussion was made by cardiology with patient and family patient underwent a stat cardiac catheterization and it up having thrombectomy in the right RCA and placement of stent. Patient currently in the ICU hemodynamically stable and denies any pain. He is started back on aspirin plus Plavix.    PAST MEDICAL HISTORY:   Past Medical History  Diagnosis Date  . Hypertension   . Diabetes mellitus without complication ([place])   . Hyperlipidemia   . Coronary artery disease   . neuropathy   . Low testosterone   . Neuropathy ([place])   . Anemia   . B12 deficiency   . GERD (gastroesophageal reflux disease)   . Skin cancer     Basal Cell  . Adenocarcinoma ([place])   . AAA (abdominal aortic aneurysm) ([place])   . Diverticulosis     PAST SURGICAL HISTOIRY:   Past Surgical History  Procedure Laterality Date  . Arthrosopic rotator  cuff repair Left   . Robotic colectomy    . Coronary angioplasty    . Vascular surgery    . Colonoscopy with propofol N/A 04/13/2015    Procedure: COLONOSCOPY WITH PROPOFOL;  Surgeon: Lollie Sails, MD;  Location: [facility] ENDOSCOPY;  Service: Endoscopy;  Laterality: N/A;  . Colonoscopy with propofol N/A 04/14/2015    Procedure: COLONOSCOPY WITH PROPOFOL;  Surgeon: Lollie Sails, MD;  Location: [facility] ENDOSCOPY;  Service: Endoscopy;  Laterality: N/A;  . Colonoscopy with propofol N/A 07/20/2015    Procedure: COLONOSCOPY WITH PROPOFOL;  Surgeon: Lollie Sails, MD;  Location: [facility] ENDOSCOPY;  Service: Endoscopy;  Laterality: N/A;  . Colon surgery    . Eye surgery Bilateral     Cataract Extraction with IOL  . Abdominal aortic aneurysm repair  2014    Dr. Delana Meyer, [facility]  . Laparoscopic right colectomy Right 09/14/2015    Procedure: LAPAROSCOPIC RIGHT COLECTOMY  CONVERTED TO OPEN RIGHT COLECTOMY, LYSIS OF ADHESIONS;  Surgeon: Leonie Green, MD;  Location: [facility] ORS;  Service: General;  Laterality: Right;  . Cardiac catheterization N/A 09/17/2015    Procedure: Left Heart Cath and Coronary Angiography;  Surgeon: Wellington Hampshire, MD;  Location: [place] CV LAB;  Service: Cardiovascular;  Laterality: N/A;  . Cardiac catheterization N/A 09/17/2015    Procedure: Coronary Stent Intervention;  Surgeon: Wellington Hampshire, MD;  Location: [place] CV LAB;  Service: Cardiovascular;  Laterality: N/A;    SOCIAL HISTORY:  Social History  Substance Use Topics  . Smoking status: Former Smoker -- 1.50 packs/day    Types: Cigarettes    Quit date: 10/03/2003  . Smokeless tobacco: Never Used  . Alcohol Use: 0.6 oz/week    1 Cans of beer per week    FAMILY HISTORY:  History reviewed. No pertinent family history.  DRUG ALLERGIES:   Allergies  Allergen Reactions  . Lipitor [Atorvastatin] Other (See Comments)    "leg cramps"  . Pravachol [Pravastatin Sodium] Other (See Comments)    "leg  cramps"  . Riomet [Metformin Hcl] Diarrhea  . Vytorin [Ezetimibe-Simvastatin] Other (See Comments)    "leg cramps"    REVIEW OF SYSTEMS:   Review of Systems  Constitutional: Negative for fever, chills and weight loss.  HENT: Negative for ear discharge, ear pain and nosebleeds.   Eyes: Negative for blurred vision, pain and discharge.  Respiratory: Negative for sputum production, shortness of breath, wheezing and stridor.   Cardiovascular: Negative for chest pain, palpitations, orthopnea and PND.  Gastrointestinal: Negative for nausea, vomiting, abdominal pain and diarrhea.  Genitourinary: Negative for urgency and frequency.  Musculoskeletal: Negative for back pain and joint pain.  Neurological: Negative for sensory change, speech change, focal weakness and weakness.  Psychiatric/Behavioral: Negative for depression and hallucinations. The patient is not nervous/anxious.   All other systems reviewed and are negative.   MEDICATIONS AT HOME:   Prior to Admission medications   Medication Sig Start Date End Date Taking? Authorizing Provider  lisinopril (PRINIVIL,ZESTRIL) 5 MG tablet Take 5 mg by mouth daily.   Yes Historical Provider, MD  loratadine (CLARITIN) 10 MG tablet Take 10 mg by mouth daily.   Yes Historical Provider, MD  metoprolol succinate (TOPROL-XL) 25 MG 24 hr tablet Take 25 mg by mouth daily.   Yes Historical Provider, MD  oxyCODONE-acetaminophen (PERCOCET/ROXICET) 5-325 MG per tablet Take by mouth every 4 (four) hours as needed for severe pain.   Yes Historical Provider, MD  pantoprazole (PROTONIX) 40 MG tablet Take 40 mg by mouth daily.    Yes Historical Provider, MD  vitamin B-12 (CYANOCOBALAMIN) 250 MCG tablet Take 500 mcg by mouth daily.   Yes Historical Provider, MD  aspirin EC 81 MG tablet Take 81 mg by mouth daily.    Historical Provider, MD  glimepiride (AMARYL) 4 MG tablet Take 4 mg by mouth daily with breakfast.    Historical Provider, MD  metFORMIN (GLUCOPHAGE)  500 MG tablet Take 500 mg by mouth 2 (two) times daily with a meal.     Historical Provider, MD  simvastatin (ZOCOR) 40 MG tablet Take 40 mg by mouth daily at 6 PM.     Historical Provider, MD      VITAL SIGNS:  Blood pressure 99/57, pulse 65, temperature 98.5 F (36.9 C), temperature source Oral, resp. rate 22, height '5\' 6"'$  (1.676 m), weight 81.647 kg (180 lb), SpO2 95 %.  PHYSICAL EXAMINATION:  GENERAL:  82 y.o.-year-old patient lying in the bed with no acute distress.  EYES: Pupils equal, round, reactive to light and accommodation. No scleral icterus. Extraocular muscles intact.  HEENT: Head atraumatic, normocephalic. Oropharynx and nasopharynx clear.  NECK:  Supple, no jugular venous distention. No thyroid enlargement, no tenderness.  LUNGS: Normal breath sounds bilaterally, no wheezing, rales,rhonchi or crepitation. No use of accessory muscles of respiration.  CARDIOVASCULAR: S1, S2 normal. No murmurs, rubs, or gallops.  ABDOMEN: Soft, nontender, nondistended. Bowel sounds absent. No organomegaly or mass. patient has midline surgical dressing.  EXTREMITIES: No pedal edema, cyanosis, or clubbing.  NEUROLOGIC: Cranial nerves II through XII are intact. Muscle strength 5/5 in all extremities. Sensation intact. Gait not checked.  PSYCHIATRIC: The patient is alert and oriented x 3.  SKIN: No obvious rash, lesion, or ulcer.   LABORATORY PANEL:   CBC  Recent Labs Lab 09/17/15 0419  WBC 11.7*  HGB 10.1*  HCT 31.1*  PLT 143*   ------------------------------------------------------------------------------------------------------------------  Chemistries   Recent Labs Lab 09/17/15 0419  NA 125*  K 4.2  CL 96*  CO2 21*  GLUCOSE 138*  BUN 38*  CREATININE 2.73*  CALCIUM 8.1*   - IMPRESSION AND PLAN:   Nicholas Parker  is a 82 y.o. male with a known history of coronary artery disease status post stent 2 in 2006, hypertension, type 2 diabetes, GERD, AAA, was recently diagnosed  to have tubular adenoma in his cecal area which was not removable with colonoscopy.  patient underwent laparoscopy and open right colectomy with extensive enterocleisis on 09/14/2015.Postop he was relatively better however he continued to have low blood pressure and creatinine was trending up. His baseline creatinine is 1.0 in December 2016. Creatinine today is 2.73. Patient denied any chest pain EKG was done this morning and showed acute inferior wall MI.  1. acute inferior wall MI -Patient had persistent low blood pressure and low urine output with elevated creatinine to 2.73. -EKG done this morning showed acute MI. Patient underwent cardiac catheterization with RCA thrombectomy and stent placement by Dr. Fletcher Anon -Aspirin and Plavix -IV fluids -Hold off on beta blockers given hypotension and acute inferior MI to avoid periods of hypotension -Statins -Hold off on ACE inhibitor as due to elevated creatinine  2. Type 2 diabetes Sliding scale insulin and glimepiride D/C metformin given elevated creatinine  3. Postop day 3 laparoscopy and open right colectomy with extensive enterocleisis  Per dr. Tamala Julian Continue IV fluids Resume the liquid diet  4. Acute renal failure multifactorial setting of acute MI and postop status along with IV contrast dye(minimal use per Dr.arida) -IV fluids -Nephrology consultation with Dr. Holley Raring cased discussed with him -Avoid nephrotoxins -Hold lisinopril and metformin  5. GERD continue PPI   All the records are reviewed and case discussed with Consulting provider. Management plans discussed with the patient, wife and they are in agreement.  CODE STATUS: Full   TOTALCritical TIME TAKING CARE OF THIS PATIENT45inutes.    Fareedah Mahler M.D on 09/17/2015 at 3:02 PM  Between 7am to 6pm - Pager - [phone]  After 6pm go to [email or web address] - password [email or web address] [place] [facility]  Office  [phone]  CC: Primary care Physician: Adin Hector,  MD

## 2015-09-17 NOTE — Consult Note (Signed)
Reason for Consult: STEMI hypotension Referring Physician:  Dr. 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  HPI:  Presents with hypotension postop day 4 for gallbladder procedure. Patient has known history of coronary disease PCI and stent in the past also peripheral vascular disease AAA with repair. The patient started having hypertension last evening with weakness and fatigue denies significant shortness of breath.  The patient has a history of diabetes has been reasonably controlled but now having hypotension weakness and fatigue. The patient had irregular heartbeat EKG was performed at about  10:44 . EKG was read as abnormal so Cardiology was consulted. Repeat EKG was performed  continued to show ST elevation inferior with reciprocal depressions.  Were because of EKG changes consistent with a STEMI patient was referred for emergent cardiac catheterization and possible intention for intervention.  Past Medical History  Diagnosis Date  . Hypertension   . Diabetes mellitus without complication ([place])   . Hyperlipidemia   . Coronary artery disease   . neuropathy   . Low testosterone   . Neuropathy ([place])   . Anemia   . B12 deficiency   . GERD (gastroesophageal reflux disease)   . Skin cancer     Basal Cell  . Adenocarcinoma ([place])   . AAA (abdominal aortic aneurysm) ([place])   . Diverticulosis     Past Surgical History  Procedure Laterality Date  . Arthrosopic rotator cuff repair Left   . Robotic colectomy    . Coronary angioplasty    . Vascular surgery    . Colonoscopy with propofol N/A 04/13/2015    Procedure: COLONOSCOPY WITH PROPOFOL;  Surgeon: Lollie Sails, MD;  Location: [facility] ENDOSCOPY;  Service: Endoscopy;  Laterality: N/A;  . Colonoscopy with propofol N/A 04/14/2015    Procedure: COLONOSCOPY WITH PROPOFOL;  Surgeon: Lollie Sails, MD;  Location: [facility] ENDOSCOPY;  Service: Endoscopy;  Laterality: N/A;  . Colonoscopy with propofol N/A 07/20/2015     Procedure: COLONOSCOPY WITH PROPOFOL;  Surgeon: Lollie Sails, MD;  Location: [facility] ENDOSCOPY;  Service: Endoscopy;  Laterality: N/A;  . Colon surgery    . Eye surgery Bilateral     Cataract Extraction with IOL  . Abdominal aortic aneurysm repair  2014    Dr. Delana Meyer, [facility]  . Laparoscopic right colectomy Right 09/14/2015    Procedure: LAPAROSCOPIC RIGHT COLECTOMY  CONVERTED TO OPEN RIGHT COLECTOMY, LYSIS OF ADHESIONS;  Surgeon: Leonie Green, MD;  Location: [facility] ORS;  Service: General;  Laterality: Right;  . Cardiac catheterization N/A 09/17/2015    Procedure: Left Heart Cath and Coronary Angiography;  Surgeon: Wellington Hampshire, MD;  Location: [place] CV LAB;  Service: Cardiovascular;  Laterality: N/A;  . Cardiac catheterization N/A 09/17/2015    Procedure: Coronary Stent Intervention;  Surgeon: Wellington Hampshire, MD;  Location: [place] CV LAB;  Service: Cardiovascular;  Laterality: N/A;    History reviewed. No pertinent family history.  Social History:  reports that he quit smoking about 11 years ago. His smoking use included Cigarettes. He smoked 1.50 packs per day. He has never used smokeless tobacco. He reports that he drinks about 0.6 oz of alcohol per week. He reports that he does not use illicit drugs.  Allergies:  Allergies  Allergen Reactions  . Lipitor [Atorvastatin] Other (See Comments)    "leg cramps"  . Pravachol [Pravastatin Sodium] Other (See Comments)    "leg cramps"  . Riomet [Metformin Hcl] Diarrhea  . Vytorin [  Ezetimibe-Simvastatin] Other (See Comments)    "leg cramps"    Medications: I have reviewed the patient's current medications.  Results for orders placed or performed during the hospital encounter of 09/14/15 (from the past 48 hour(s))  Glucose, capillary     Status: Abnormal   Collection Time: 09/15/15  4:41 PM  Result Value Ref Range   Glucose-Capillary 156 (H) 65 - 99 mg/dL  Glucose, capillary     Status: Abnormal   Collection Time:  09/15/15  9:20 PM  Result Value Ref Range   Glucose-Capillary 133 (H) 65 - 99 mg/dL   Comment 1 Notify RN   Potassium     Status: None   Collection Time: 09/16/15  5:40 AM  Result Value Ref Range   Potassium 4.7 3.5 - 5.1 mmol/L  Glucose, capillary     Status: Abnormal   Collection Time: 09/16/15  7:41 AM  Result Value Ref Range   Glucose-Capillary 147 (H) 65 - 99 mg/dL  Glucose, capillary     Status: Abnormal   Collection Time: 09/16/15 11:47 AM  Result Value Ref Range   Glucose-Capillary 136 (H) 65 - 99 mg/dL  Glucose, capillary     Status: Abnormal   Collection Time: 09/16/15  4:24 PM  Result Value Ref Range   Glucose-Capillary 123 (H) 65 - 99 mg/dL   Comment 1 Notify RN   Glucose, capillary     Status: Abnormal   Collection Time: 09/16/15  9:11 PM  Result Value Ref Range   Glucose-Capillary 114 (H) 65 - 99 mg/dL  CBC     Status: Abnormal   Collection Time: 09/17/15  4:19 AM  Result Value Ref Range   WBC 11.7 (H) 3.8 - 10.6 K/uL   RBC 3.71 (L) 4.40 - 5.90 MIL/uL   Hemoglobin 10.1 (L) 13.0 - 18.0 g/dL   HCT 31.1 (L) 40.0 - 52.0 %   MCV 83.8 80.0 - 100.0 fL   MCH 27.3 26.0 - 34.0 pg   MCHC 32.5 32.0 - 36.0 g/dL   RDW 15.5 (H) 11.5 - 14.5 %   Platelets 143 (L) 150 - 440 K/uL  Basic metabolic panel     Status: Abnormal   Collection Time: 09/17/15  4:19 AM  Result Value Ref Range   Sodium 125 (L) 135 - 145 mmol/L   Potassium 4.2 3.5 - 5.1 mmol/L   Chloride 96 (L) 101 - 111 mmol/L   CO2 21 (L) 22 - 32 mmol/L   Glucose, Bld 138 (H) 65 - 99 mg/dL   BUN 38 (H) 6 - 20 mg/dL   Creatinine, Ser 2.73 (H) 0.61 - 1.24 mg/dL   Calcium 8.1 (L) 8.9 - 10.3 mg/dL   GFR calc non Af Amer 21 (L) >60 mL/min   GFR calc Af Amer 24 (L) >60 mL/min    Comment: (NOTE) The eGFR has been calculated using the CKD EPI equation. This calculation has not been validated in all clinical situations. eGFR's persistently <60 mL/min signify possible Chronic Kidney Disease.    Anion gap 8 5 - 15   Glucose, capillary     Status: Abnormal   Collection Time: 09/17/15  5:59 AM  Result Value Ref Range   Glucose-Capillary 129 (H) 65 - 99 mg/dL  Glucose, capillary     Status: Abnormal   Collection Time: 09/17/15  7:35 AM  Result Value Ref Range   Glucose-Capillary 122 (H) 65 - 99 mg/dL  Glucose, capillary     Status: Abnormal  Collection Time: 09/17/15 11:28 AM  Result Value Ref Range   Glucose-Capillary 235 (H) 65 - 99 mg/dL  CK     Status: Abnormal   Collection Time: 09/17/15  2:46 PM  Result Value Ref Range   Total CK 506 (H) 49 - 397 U/L  Troponin I (q 6hr x 3)     Status: Abnormal   Collection Time: 09/17/15  2:46 PM  Result Value Ref Range   Troponin I 53.37 (H) <0.031 ng/mL    Comment: READ BACK AND VERIFIED WITH BRITTANY KILLINGSWORTH ON 09/17/15 AT 1538PM BY TLB        POSSIBLE MYOCARDIAL ISCHEMIA. SERIAL TESTING RECOMMENDED.   Magnesium     Status: None   Collection Time: 09/17/15  2:46 PM  Result Value Ref Range   Magnesium 2.0 1.7 - 2.4 mg/dL  Glucose, capillary     Status: Abnormal   Collection Time: 09/17/15  3:02 PM  Result Value Ref Range   Glucose-Capillary 37 (LL) 65 - 99 mg/dL   Comment 1 Repeat Test   Glucose, capillary     Status: Abnormal   Collection Time: 09/17/15  3:04 PM  Result Value Ref Range   Glucose-Capillary 36 (LL) 65 - 99 mg/dL  Glucose, capillary     Status: None   Collection Time: 09/17/15  3:29 PM  Result Value Ref Range   Glucose-Capillary 77 65 - 99 mg/dL    No results found.  Review of Systems  Constitutional: Positive for malaise/fatigue.  Eyes: Negative.   Respiratory: Positive for shortness of breath.   Cardiovascular: Positive for orthopnea.  Gastrointestinal: Positive for abdominal pain.  Genitourinary: Negative.   Musculoskeletal: Negative.   Skin: Negative.   Neurological: Positive for weakness.  Endo/Heme/Allergies: Negative.   Psychiatric/Behavioral: Negative.    Blood pressure 99/57, pulse 65, temperature  98.5 F (36.9 C), temperature source Oral, resp. rate 22, height 5' 6"  (1.676 m), weight 81.647 kg (180 lb), SpO2 95 %. Physical Exam  Nursing note and vitals reviewed. Constitutional: He is oriented to person, place, and time. He appears well-developed and well-nourished.  HENT:  Head: Normocephalic and atraumatic.  Eyes: Conjunctivae and EOM are normal. Pupils are equal, round, and reactive to light.  Neck: Normal range of motion. Neck supple.  Cardiovascular: Normal rate and regular rhythm.   Murmur heard. Respiratory: Effort normal and breath sounds normal.  GI: Soft. Bowel sounds are normal. There is tenderness.  Musculoskeletal: Normal range of motion.  Neurological: He is alert and oriented to person, place, and time. He has normal reflexes.  Skin: Skin is warm and dry.  Psychiatric: He has a normal mood and affect.    Assessment/Plan:  STEMI inferior wall  In house  postop gallbladder surgery day 4   Hypotension  diabetes  GERD  acute renal insufficiency  weakness  coronary disease  peripheral vascular disease including AAA repair . PLAN  recommend code  "STEMI  transferred to cath lab for emergent intervention  anticoagulation with heparin  aspirin therapy  325 daily  Plavix load 600 mg  then 75 a day  consider echocardiogram  hydrate  Fluid boluses IV  continue diabetes management control  referred to Nephrology for evaluation renal insufficiency  continue Protonix therapy and symptoms Refer to  Medicine for management continue low-dose beta-blockers pre and post   Girard Koontz D. 09/17/2015, 3:55 PM

## 2015-09-18 ENCOUNTER — Inpatient Hospital Stay
Admission: RE | Admit: 2015-09-18 | Discharge: 2015-09-18 | Disposition: A | Discharge status: Home / Self Care | Payer: Commercial Managed Care - HMO | Source: Ambulatory Visit | Attending: Cardiovascular Disease | Admitting: Cardiovascular Disease

## 2015-09-18 ENCOUNTER — Inpatient Hospital Stay: Payer: Commercial Managed Care - HMO

## 2015-09-18 LAB — CBC
HEMATOCRIT: 31 % — AB (ref 40.0–52.0)
HEMOGLOBIN: 10.5 g/dL — AB (ref 13.0–18.0)
MCH: 28.8 pg (ref 26.0–34.0)
MCHC: 33.8 g/dL (ref 32.0–36.0)
MCV: 85.2 fL (ref 80.0–100.0)
Platelets: 156 10*3/uL (ref 150–440)
RBC: 3.64 MIL/uL — ABNORMAL LOW (ref 4.40–5.90)
RDW: 15.3 % — ABNORMAL HIGH (ref 11.5–14.5)
WBC: 7.4 10*3/uL (ref 3.8–10.6)

## 2015-09-18 LAB — BASIC METABOLIC PANEL
ANION GAP: 6 (ref 5–15)
BUN: 36 mg/dL — AB (ref 6–20)
CALCIUM: 8.1 mg/dL — AB (ref 8.9–10.3)
CO2: 20 mmol/L — AB (ref 22–32)
Chloride: 108 mmol/L (ref 101–111)
Creatinine, Ser: 1.89 mg/dL — ABNORMAL HIGH (ref 0.61–1.24)
GFR calc Af Amer: 37 mL/min — ABNORMAL LOW (ref >60)
GFR calc non Af Amer: 32 mL/min — ABNORMAL LOW (ref >60)
GLUCOSE: 85 mg/dL (ref 65–99)
Potassium: 4.3 mmol/L (ref 3.5–5.1)
Sodium: 134 mmol/L — ABNORMAL LOW (ref 135–145)

## 2015-09-18 LAB — GLUCOSE, CAPILLARY
GLUCOSE-CAPILLARY: 87 mg/dL (ref 65–99)
GLUCOSE-CAPILLARY: 59 mg/dL — AB (ref 65–99)
GLUCOSE-CAPILLARY: 62 mg/dL — AB (ref 65–99)
GLUCOSE-CAPILLARY: 150 mg/dL — AB (ref 65–99)
Glucose-Capillary: 72 mg/dL (ref 65–99)
Glucose-Capillary: 97 mg/dL (ref 65–99)
Glucose-Capillary: 164 mg/dL — ABNORMAL HIGH (ref 65–99)
Glucose-Capillary: 120 mg/dL — ABNORMAL HIGH (ref 65–99)

## 2015-09-18 LAB — TROPONIN I
TROPONIN I: 33.33 ng/mL — AB (ref <0.031)
Troponin I: 42.3 ng/mL — ABNORMAL HIGH (ref <0.031)

## 2015-09-18 MED ORDER — DEXTROSE 10 % IV SOLN
INTRAVENOUS | Status: DC
  Administered 2015-09-18 – 2015-09-19 (×2): via INTRAVENOUS

## 2015-09-18 MED ORDER — BISACODYL 10 MG RE SUPP: 10.0000 mg | Freq: Once | RECTAL | Status: DC

## 2015-09-18 MED ORDER — METOPROLOL TARTRATE 25 MG PO TABS
12.5000 mg | ORAL_TABLET | Freq: Two times a day (BID) | ORAL | Status: DC
  Administered 2015-09-18 – 2015-09-19 (×3): 12.5 mg via ORAL
  Administered 2015-09-19: 25 mg via ORAL
  Administered 2015-09-20 – 2015-09-21 (×3): 12.5 mg via ORAL
  Filled 2015-09-18 (×5): qty 1
  Filled 2015-09-18: qty 0.5
  Filled 2015-09-18 (×2): qty 1

## 2015-09-18 MED ORDER — SODIUM CHLORIDE 0.9 % IV SOLN: INTRAVENOUS | Status: DC

## 2015-09-18 MED ORDER — ONDANSETRON HCL 4 MG/2ML IJ SOLN: 4.0000 mg | Freq: Four times a day (QID) | INTRAMUSCULAR | Status: DC | PRN

## 2015-09-18 NOTE — Progress Notes (Signed)
Pt transferred from CCU to 2A RM 248. Pt and wife oriented to unit and room. Tele box verified by myself and Shana R., NT. Pt requested pain med and an attempt at [facility] without suppository. Suppository rescheduled for 2000 in casse pt unsuccessful at [facility]. Will continue to assess.

## 2015-09-18 NOTE — Progress Notes (Signed)
Per Dr Rayann Heman does not believe his BP is related to his cardiac. I was told to call Dr Jamal Collin back for any orders or parameters.

## 2015-09-18 NOTE — Progress Notes (Signed)
Patient ID: Nicholas Parker, male   DOB: March 16, 1934, 82 y.o.   MRN: [id number] Pt is awake, alert. Denies any chest pain. Mild abdominal pain-incisional. No n/v. No bm or flatus yet. BP has been low- mostly in 80s. HR remains unchanged- 80-90. Has had excellent u/o. Labs- bun,creat(1.83) both trending  down. Na is 134-up fro 125. Hgb stable at 10. Abdomen is soft, mildly distended. Incision is clean and intact. Bowel sounds are normal with few tinkles heard. Lungs clear. Overall stable. Low BP- not symptomatic, is having good u/o and creat is trending down. Continue IV at 189m per hor. Diet- full liq. Trial of dulcolax supp. If cardiology feels he is stable can move to floor care

## 2015-09-18 NOTE — Progress Notes (Signed)
[facility]  ROUNDING NOTE   Subjective:  Patient seen at bedside in the critical care unit. Good urine output noted at 2 L. Creatinine down to 1.89. No chest pain this a.m.   Objective:  Vital signs in last 24 hours:  Temp:  [97.5 F (36.4 C)-98.6 F (37 C)] 97.5 F (36.4 C) (01/21 0800) Pulse Rate:  [27-112] 83 (01/21 0500) Resp:  [16-30] 18 (01/21 0500) BP: (77-131)/(35-66) 81/44 mmHg (01/21 0500) SpO2:  [95 %-99 %] 96 % (01/21 0500)  Weight change:  Filed Weights   09/14/15 0827  Weight: 81.647 kg (180 lb)    Intake/Output: I/O last 3 completed shifts: In: 3419.8 [P.O.:1320; I.V.:2099.8] Out: 2075 [Urine:2075]   Intake/Output this shift:  Total I/O In: -  Out: 500 [Urine:500]  Physical Exam: General: NAD, resting comfortably.  Head: Normocephalic, atraumatic. Moist oral mucosal membranes  Eyes: Anicteric, PERRL  Neck: Supple, trachea midline  Lungs:  Clear to auscultation, normal effort  Heart: S1S2 no rubs  Abdomen:  Soft, nontender, BS present, midline incision dressing intact  Extremities: no peripheral edema.  Neurologic: Nonfocal, moving all four extremities  Skin: No lesions  Access:     Basic Metabolic Panel:  Recent Labs Lab 09/14/15 1635 09/15/15 0601 09/16/15 0540 09/17/15 0419 09/17/15 1446 09/18/15 0440  NA  --  135  --  125*  --  134*  K  --  5.4* 4.7 4.2  --  4.3  CL  --  110  --  96*  --  108  CO2  --  15*  --  21*  --  20*  GLUCOSE  --  280*  --  138*  --  85  BUN  --  21*  --  38*  --  36*  CREATININE 1.51* 1.23  --  2.73* 2.28* 1.89*  CALCIUM  --  8.4*  --  8.1*  --  8.1*  MG  --   --   --   --  2.0  --     Liver Function Tests: No results for input(s): AST, ALT, ALKPHOS, BILITOT, PROT, ALBUMIN in the last 168 hours. No results for input(s): LIPASE, AMYLASE in the last 168 hours. No results for input(s): AMMONIA in the last 168 hours.  CBC:  Recent Labs Lab 09/15/15 0601 09/17/15 0419 09/18/15 0440   WBC 13.0* 11.7* 7.4  HGB 12.9* 10.1* 10.5*  HCT 39.4* 31.1* 31.0*  MCV 85.5 83.8 85.2  PLT 187 143* 156    Cardiac Enzymes:  Recent Labs Lab 09/17/15 1446 09/17/15 2233 09/18/15 0440 09/18/15 0942  CKTOTAL 506*  --   --   --   TROPONINI 53.37* 44.19* 42.30* 33.33*    BNP: Invalid input(s): POCBNP  CBG:  Recent Labs Lab 09/18/15 0321 09/18/15 0625 09/18/15 0739 09/18/15 0828 09/18/15 0926  GLUCAP 87 72 59* 62* 97    Microbiology: Results for orders placed or performed during the hospital encounter of 09/06/15  Surgical pcr screen     Status: None   Collection Time: 09/06/15  2:40 PM  Result Value Ref Range Status   MRSA, PCR NEGATIVE NEGATIVE Final   Staphylococcus aureus NEGATIVE NEGATIVE Final    Comment:        The Xpert SA Assay (FDA approved for NASAL specimens in patients over 23 years of age), is one component of a comprehensive surveillance program.  Test performance has been validated by [facility] for patients greater than or equal to 1 year  old. It is not intended to diagnose infection nor to guide or monitor treatment.     Coagulation Studies: No results for input(s): LABPROT, INR in the last 72 hours.  Urinalysis: No results for input(s): COLORURINE, LABSPEC, PHURINE, GLUCOSEU, HGBUR, BILIRUBINUR, KETONESUR, PROTEINUR, UROBILINOGEN, NITRITE, LEUKOCYTESUR in the last 72 hours.  Invalid input(s): APPERANCEUR    Imaging: US Renal  09/18/2015  CLINICAL DATA:  Acute renal failure. EXAM: RENAL / URINARY TRACT ULTRASOUND COMPLETE COMPARISON:  CT of the abdomen and pelvis 04/26/2015 FINDINGS: Right Kidney: Length: 11.1. Echogenicity within normal limits. No mass or hydronephrosis visualized. Left Kidney: Length: 11.7. Echogenicity within normal limits. No mass or hydronephrosis visualized. There is a questionable exophytic left lower pole renal mass measuring 1.9 x 1.4 x 1.4 cm. Bladder: Appears normal for degree of bladder distention.  IMPRESSION: No evidence of hydronephrosis. Questionable exophytic left lower pole renal mass versus complicated cyst measuring 1.9 cm. Attention on follow-up is recommended. Electronically Signed   By: Fidela Salisbury M.D.   On: 09/18/2015 10:27     Medications:   . dextrose 100 mL/hr at 09/18/15 0851   . aspirin  81 mg Oral Daily  . clopidogrel  75 mg Oral Q breakfast  . enoxaparin (LOVENOX) injection  40 mg Subcutaneous Q24H  . feeding supplement (GLUCERNA SHAKE)  237 mL Oral TID BM  . insulin aspart  0-15 Units Subcutaneous TID WC  . insulin aspart  0-5 Units Subcutaneous QHS  . loratadine  10 mg Oral Daily  . metoprolol tartrate  12.5 mg Oral BID  . ondansetron (ZOFRAN) IV  4 mg Intravenous 4 times per day  . pantoprazole  40 mg Oral QAC breakfast  . senna  1 tablet Oral BID  . simvastatin  40 mg Oral q1800  . sodium chloride  3 mL Intravenous Q12H   sodium chloride, acetaminophen **OR** acetaminophen, morphine injection, oxyCODONE-acetaminophen, sodium chloride  Assessment/ Plan:  82 y.o. male with a PMHx of hypertension, diabetes mellitus type 2, hyperlipidemia, coronary disease status post stent placement 2, peripheral neuropathy, anemia, B12 deficiency, GERD, history of basal cell carcinoma, diverticulosis, who was admitted to [facility] on 09/14/2015 for evaluation of tubular adenoma status post right open colectomy on 09/14/2015. Hospital course complicated by post operative hypotension and acute myocardial infarction.   1. Acute renal failure due to ATN: Pt hypotensive post operative, also received contrast dye this admission. -Pt seen at bedside, renal function improved today, pt received some IVF hydration, now on D10, serum Na up to 134 from 125.  Continue D10 for now.  2. Hyponatremia. Likely related to postsurgical losses. Sodium up to 134, now currently on D10, therefore will need to warch Na closely.   3. Anemia unspecified.Hgb stable at 10.5, continue to  monitor.  4.  Left renal cyst/mass:  Noted on renal US, would consider CT scan with IV contrast vs MRI w/o contrast depending upon renal function improvement.     LOS: 4 Axel Meas 1/21/201710:46 AM

## 2015-09-18 NOTE — Progress Notes (Addendum)
Dr Rayann Heman updated with patients troponin value- trending down. Patient is not complaining of any chest pain. He is alert and oriented. Per Dr Jamal Collin at this point increase fluids to 100 ml/hr goal MAP of 55 or greater. MD notified BP 77/45. No systolic parameters given but orders to montir mentation level.

## 2015-09-18 NOTE — Progress Notes (Signed)
[place] [facility] NAME: Nicholas Parker    MR#:  [id number]  DATE OF BIRTH:  Jan 20, 1934  SUBJECTIVE: I feel good  CHIEF COMPLAINT:  No chief complaint on file.  The patient is a 81 year old Caucasian male who underwent a right colectomy with extensive anterior leads. This on January 17, postoperatively, patient had chest pain and was diagnosed with acute MI with maximum troponin level of 53. Patient underwent cardiac catheterization,  thrombectomy with stent placement in RCA. Patient was noted to be in acute renal failure with creatinine level of 2.7 on January 19. At that time he was also hyponatremic. IV fluids were administered due to concerns of dehydration and patient's sodium level as well as creatinine are improving. Patient is eating full liquid diet. He is passing gas but no stool yet Review of Systems  Constitutional: Negative for fever, chills and weight loss.  HENT: Negative for congestion.   Eyes: Negative for blurred vision and double vision.  Respiratory: Negative for cough, sputum production, shortness of breath and wheezing.   Cardiovascular: Negative for chest pain, palpitations, orthopnea, leg swelling and PND.  Gastrointestinal: Negative for nausea, vomiting, abdominal pain, diarrhea, constipation and blood in stool.  Genitourinary: Negative for dysuria, urgency, frequency and hematuria.  Musculoskeletal: Negative for falls.  Neurological: Negative for dizziness, tremors, focal weakness and headaches.  Endo/Heme/Allergies: Does not bruise/bleed easily.  Psychiatric/Behavioral: Negative for depression. The patient does not have insomnia.     VITAL SIGNS: Blood pressure 113/46, pulse 91, temperature 97.7 F (36.5 C), temperature source Oral, resp. rate 24, height '5\' 6"'$  (1.676 m), weight 81.647 kg (180 lb), SpO2 98 %.  PHYSICAL EXAMINATION:   GENERAL:  82 y.o.-year-old patient lying in the bed with no acute distress.   EYES: Pupils equal, round, reactive to light and accommodation. No scleral icterus. Extraocular muscles intact.  HEENT: Head atraumatic, normocephalic. Oropharynx and nasopharynx clear.  NECK:  Supple, no jugular venous distention. No thyroid enlargement, no tenderness.  LUNGS: Normal breath sounds bilaterally, no wheezing, rales,rhonchi or crepitation. No use of accessory muscles of respiration.  CARDIOVASCULAR: S1, S2 normal. No murmurs, rubs, or gallops.  ABDOMEN: Soft, nontender, nondistended. Bowel sounds present. No organomegaly or mass. Well-healing midline abdominal incision site was noted, no drainage, erythema, purulence or bleeding EXTREMITIES: No pedal edema, cyanosis, or clubbing.  NEUROLOGIC: Cranial nerves II through XII are intact. Muscle strength 5/5 in all extremities. Sensation intact. Gait not checked.  PSYCHIATRIC: The patient is alert and oriented x 3.  SKIN: No obvious rash, lesion, or ulcer.   ORDERS/RESULTS REVIEWED:   CBC  Recent Labs Lab 09/15/15 0601 09/17/15 0419 09/18/15 0440  WBC 13.0* 11.7* 7.4  HGB 12.9* 10.1* 10.5*  HCT 39.4* 31.1* 31.0*  PLT 187 143* 156  MCV 85.5 83.8 85.2  MCH 28.1 27.3 28.8  MCHC 32.8 32.5 33.8  RDW 15.5* 15.5* 15.3*   ------------------------------------------------------------------------------------------------------------------  Chemistries   Recent Labs Lab 09/14/15 1635 09/15/15 0601 09/16/15 0540 09/17/15 0419 09/17/15 1446 09/18/15 0440  NA  --  135  --  125*  --  134*  K  --  5.4* 4.7 4.2  --  4.3  CL  --  110  --  96*  --  108  CO2  --  15*  --  21*  --  20*  GLUCOSE  --  280*  --  138*  --  85  BUN  --  21*  --  38*  --  36*  CREATININE 1.51* 1.23  --  2.73* 2.28* 1.89*  CALCIUM  --  8.4*  --  8.1*  --  8.1*  MG  --   --   --   --  2.0  --    ------------------------------------------------------------------------------------------------------------------ estimated creatinine clearance is 31.8  mL/min (by C-G formula based on Cr of 1.89). ------------------------------------------------------------------------------------------------------------------ No results for input(s): TSH, T4TOTAL, T3FREE, THYROIDAB in the last 72 hours.  Invalid input(s): FREET3  Cardiac Enzymes  Recent Labs Lab 09/17/15 2233 09/18/15 0440 09/18/15 0942  TROPONINI 44.19* 42.30* 33.33*   ------------------------------------------------------------------------------------------------------------------ Invalid input(s): POCBNP ---------------------------------------------------------------------------------------------------------------  RADIOLOGY: US Renal  09/18/2015  CLINICAL DATA:  Acute renal failure. EXAM: RENAL / URINARY TRACT ULTRASOUND COMPLETE COMPARISON:  CT of the abdomen and pelvis 04/26/2015 FINDINGS: Right Kidney: Length: 11.1. Echogenicity within normal limits. No mass or hydronephrosis visualized. Left Kidney: Length: 11.7. Echogenicity within normal limits. No mass or hydronephrosis visualized. There is a questionable exophytic left lower pole renal mass measuring 1.9 x 1.4 x 1.4 cm. Bladder: Appears normal for degree of bladder distention. IMPRESSION: No evidence of hydronephrosis. Questionable exophytic left lower pole renal mass versus complicated cyst measuring 1.9 cm. Attention on follow-up is recommended. Electronically Signed   By: Fidela Salisbury M.D.   On: 09/18/2015 10:27    EKG:  Orders placed or performed during the hospital encounter of 09/14/15  . EKG 12-Lead  . EKG 12-Lead  . EKG 12-Lead  . EKG 12-Lead  . EKG 12-Lead immediately post procedure  . EKG 12-Lead  . EKG 12-Lead immediately post procedure  . EKG 12-Lead  . EKG 12-Lead  . EKG 12-Lead    ASSESSMENT AND PLAN:  Active Problems:   Colon polyp   Acute ST elevation myocardial infarction (STEMI) involving right coronary artery ([place])  #1. Acute STEMI involving right coronary artery, status post cardiac  catheterization and stent placement to RCA, continue patient on aspirin, Plavix, Lovenox, metoprolol, Zocor, cardiology follow-up with outpatient #2. Hyponatremia, likely due to dehydration, continue patient on high rate IV fluids, following, sodium level in the morning #3. Acute renal failure due to ATN, improving with IV fluid administration #4: Colon polyp, status post open right colectomy with extensive enterolysis, doing relatively well, being continued on full liquid diet, has no bowel movements yet.  Last bowel movement was on January 16 . Suppository is given by surgery.     Management plans discussed with the patient, family and they are in agreement.   DRUG ALLERGIES:  Allergies  Allergen Reactions  . Lipitor [Atorvastatin] Other (See Comments)    "leg cramps"  . Pravachol [Pravastatin Sodium] Other (See Comments)    "leg cramps"  . Riomet [Metformin Hcl] Diarrhea  . Vytorin [Ezetimibe-Simvastatin] Other (See Comments)    "leg cramps"    CODE STATUS:     Code Status Orders        Start     Ordered   09/17/15 1406  Full code   Continuous     09/17/15 1405    Code Status History    Date Active Date Inactive Code Status Order ID Comments User Context   09/14/2015  4:25 PM 09/17/2015  2:05 PM Full Code [id number]  Leonie Green, MD Inpatient    Advance Directive Documentation        Most Recent Value   Type of Advance Directive  Living will   Pre-existing out of facility DNR order (yellow form or pink MOST form)     "  MOST" Form in Place?        TOTAL TIME TAKING CARE OF THIS PATIENT: 35 minutes.  Discussed with patient's family   Doristine Shehan M.D on 09/18/2015 at 1:36 PM  Between 7am to 6pm - Pager - [phone]  After 6pm go to [email or web address] - password [email or web address]  [facility]  Office  [phone]  CC: Primary care physician; Adin Hector, MD

## 2015-09-19 LAB — BASIC METABOLIC PANEL
ANION GAP: 6 (ref 5–15)
ANION GAP: 6 (ref 5–15)
BUN: 26 mg/dL — ABNORMAL HIGH (ref 6–20)
BUN: 25 mg/dL — ABNORMAL HIGH (ref 6–20)
CHLORIDE: 109 mmol/L (ref 101–111)
CO2: 24 mmol/L (ref 22–32)
CO2: 23 mmol/L (ref 22–32)
CREATININE: 1.22 mg/dL (ref 0.61–1.24)
Calcium: 8.7 mg/dL — ABNORMAL LOW (ref 8.9–10.3)
Calcium: 8.5 mg/dL — ABNORMAL LOW (ref 8.9–10.3)
Chloride: 109 mmol/L (ref 101–111)
Creatinine, Ser: 1.3 mg/dL — ABNORMAL HIGH (ref 0.61–1.24)
GFR calc Af Amer: 59 mL/min — ABNORMAL LOW (ref >60)
GFR calc Af Amer: >60 mL/min (ref >60)
GFR calc non Af Amer: 51 mL/min — ABNORMAL LOW (ref >60)
GFR, EST NON AFRICAN AMERICAN: 55 mL/min — AB (ref >60)
GLUCOSE: 133 mg/dL — AB (ref 65–99)
Glucose, Bld: 142 mg/dL — ABNORMAL HIGH (ref 65–99)
POTASSIUM: 4.4 mmol/L (ref 3.5–5.1)
POTASSIUM: 4.4 mmol/L (ref 3.5–5.1)
Sodium: 139 mmol/L (ref 135–145)
Sodium: 138 mmol/L (ref 135–145)

## 2015-09-19 LAB — GLUCOSE, CAPILLARY
GLUCOSE-CAPILLARY: 126 mg/dL — AB (ref 65–99)
GLUCOSE-CAPILLARY: 124 mg/dL — AB (ref 65–99)
Glucose-Capillary: 137 mg/dL — ABNORMAL HIGH (ref 65–99)
Glucose-Capillary: 116 mg/dL — ABNORMAL HIGH (ref 65–99)

## 2015-09-19 LAB — CBC WITH DIFFERENTIAL/PLATELET
BASOS ABS: 0.1 10*3/uL (ref 0–0.1)
Basophils Relative: 1 %
Eosinophils Absolute: 0.3 10*3/uL (ref 0–0.7)
Eosinophils Relative: 4 %
HEMATOCRIT: 31 % — AB (ref 40.0–52.0)
HEMOGLOBIN: 10.3 g/dL — AB (ref 13.0–18.0)
LYMPHS PCT: 13 %
Lymphs Abs: 0.9 10*3/uL — ABNORMAL LOW (ref 1.0–3.6)
MCH: 28.3 pg (ref 26.0–34.0)
MCHC: 33.2 g/dL (ref 32.0–36.0)
MCV: 85.4 fL (ref 80.0–100.0)
MONO ABS: 0.9 10*3/uL (ref 0.2–1.0)
Monocytes Relative: 12 %
NEUTROS ABS: 5 10*3/uL (ref 1.4–6.5)
NEUTROS PCT: 70 %
Platelets: 185 10*3/uL (ref 150–440)
RBC: 3.64 MIL/uL — AB (ref 4.40–5.90)
RDW: 15.5 % — AB (ref 11.5–14.5)
WBC: 7.3 10*3/uL (ref 3.8–10.6)

## 2015-09-19 NOTE — Progress Notes (Signed)
Patient remains alert and oriented, slept most of the night, NSR w BB  on the monitor, prn pain med administer twice during  the shift, bowel moved once this shift, no acute distress noted.

## 2015-09-19 NOTE — Progress Notes (Signed)
Subjective:   status post STEMI in house PCI and stent to RCA with DES. Significant inferior wall myocardial infarction with hypokinesis inferiorly and now ejection fraction around 45%. Patient reasonably asymptomatic feels reasonably well will increase activity encourage ambulation in the halls  Objective:  Vital Signs in the last 24 hours: Temp:  [97.3 F (36.3 C)-98.5 F (36.9 C)] 98.5 F (36.9 C) (01/22 1121) Pulse Rate:  [74-102] 74 (01/22 1121) Resp:  [16-22] 16 (01/22 1121) BP: (103-144)/(50-58) 103/58 mmHg (01/22 1121) SpO2:  [95 %-100 %] 98 % (01/22 1121)  Intake/Output from previous day: 01/21 0701 - 01/22 0700 In: 547.5 [I.V.:547.5] Out: 2025 [Urine:2025] Intake/Output from this shift:    Physical Exam: General appearance: alert, cooperative and appears stated age Neck: no adenopathy, no carotid bruit, no JVD, supple, symmetrical, trachea midline and thyroid not enlarged, symmetric, no tenderness/mass/nodules Lungs: clear to auscultation bilaterally Heart: regular rate and rhythm, S1, S2 normal, no murmur, click, rub or gallop Abdomen: soft, non-tender; bowel sounds normal; no masses,  no organomegaly Extremities: extremities normal, atraumatic, no cyanosis or edema Pulses: 2+ and symmetric Skin: Skin color, texture, turgor normal. No rashes or lesions Neurologic: Alert and oriented X 3, normal strength and tone. Normal symmetric reflexes. Normal coordination and gait  Lab Results:  Recent Labs  09/18/15 0440 09/19/15 0517  WBC 7.4 7.3  HGB 10.5* 10.3*  PLT 156 185    Recent Labs  09/19/15 0517 09/19/15 0756  NA 139 138  K 4.4 4.4  CL 109 109  CO2 24 23  GLUCOSE 133* 142*  BUN 26* 25*  CREATININE 1.30* 1.22    Recent Labs  09/18/15 0440 09/18/15 0942  TROPONINI 42.30* 33.33*   Hepatic Function Panel No results for input(s): PROT, ALBUMIN, AST, ALT, ALKPHOS, BILITOT, BILIDIR, IBILI in the last 72 hours. No results for input(s): CHOL in the  last 72 hours. No results for input(s): PROTIME in the last 72 hours.  Imaging: Imaging results have been reviewed  Cardiac Studies:  Assessment/Plan:   status post STEMI  PCI and stent to RCA  hypertension  acute on chronic renal insufficiency  coronary artery disease  diabetes type 2 uncomplicated  GERD  hyperlipidemia  postop gallbladder surgery . PLAN  continue aspirin for ASCVD cardiovascular disease  Plavix 75 mg a day for at least 12 months  continue metoprolol hypertension post Myocardial infarction  agree with nephrology input for renal insufficiency  continue simvastatin for hyperlipidemia  consider hydralazine and Imdur or for cardiomyopathy which is mild   would avoid ACE-inhibitor ARB because of renal insufficiency at this point  continue Protonix for reflux symptoms  increase activity by ambulating in the halls  have the patient follow up with Cardiology as an outpatient 1-2 weeks    LOS: 5 days    CALLWOOD,DWAYNE D. 09/19/2015, 7:17 PM

## 2015-09-19 NOTE — Progress Notes (Signed)
[facility] NAME: Nicholas Parker    MR#:  [id number]  DATE OF BIRTH:  [birth year]  SUBJECTIVE: I feel good  CHIEF COMPLAINT:  No chief complaint on file.  The patient is a 82 year old Caucasian male who underwent a right colectomy with extensive anterior leads. This on January 17, postoperatively, patient had chest pain and was diagnosed with acute MI with maximum troponin level of 53. Patient underwent cardiac catheterization,  thrombectomy with stent placement in RCA. Patient was noted to be in acute renal failure with creatinine level of 2.7 on January 19. At that time he was also hyponatremic. IV fluids were administered due to concerns of dehydration and patient's sodium level as well as creatinine are improving. Back on solid diet, he had a good bowel movement yesterday Patient feels good today. Denies significant discomfort.   , Review of Systems  Constitutional: Negative for fever, chills and weight loss.  HENT: Negative for congestion.   Eyes: Negative for blurred vision and double vision.  Respiratory: Negative for cough, sputum production, shortness of breath and wheezing.   Cardiovascular: Negative for chest pain, palpitations, orthopnea, leg swelling and PND.  Gastrointestinal: Negative for nausea, vomiting, abdominal pain, diarrhea, constipation and blood in stool.  Genitourinary: Negative for dysuria, urgency, frequency and hematuria.  Musculoskeletal: Negative for falls.  Neurological: Negative for dizziness, tremors, focal weakness and headaches.  Endo/Heme/Allergies: Does not bruise/bleed easily.  Psychiatric/Behavioral: Negative for depression. The patient does not have insomnia.     VITAL SIGNS: Blood pressure 103/58, pulse 74, temperature 98.5 F (36.9 C), temperature source Oral, resp. rate 16, height '5\' 6"'$  (1.676 m), weight 84.823 kg (187 lb), SpO2 98 %.  PHYSICAL EXAMINATION:   GENERAL:  82 y.o.-year-old  patient lying in the bed with no acute distress.  EYES: Pupils equal, round, reactive to light and accommodation. No scleral icterus. Extraocular muscles intact.  HEENT: Head atraumatic, normocephalic. Oropharynx and nasopharynx clear.  NECK:  Supple, no jugular venous distention. No thyroid enlargement, no tenderness.  LUNGS: Normal breath sounds bilaterally, no wheezing, rales,rhonchi or crepitation. No use of accessory muscles of respiration.  CARDIOVASCULAR: S1, S2 normal. No murmurs, rubs, or gallops.  ABDOMEN: Soft, nontender, nondistended. Bowel sounds present. No organomegaly or mass. Well-healing midline abdominal incision was noted, no drainage, erythema, purulence or bleeding EXTREMITIES: No pedal edema, cyanosis, or clubbing.  NEUROLOGIC: Cranial nerves II through XII are intact. Muscle strength 5/5 in all extremities. Sensation intact. Gait not checked.  PSYCHIATRIC: The patient is alert and oriented x 3.  SKIN: No obvious rash, lesion, or ulcer.   ORDERS/RESULTS REVIEWED:   CBC  Recent Labs Lab 09/15/15 0601 09/17/15 0419 09/18/15 0440 09/19/15 0517  WBC 13.0* 11.7* 7.4 7.3  HGB 12.9* 10.1* 10.5* 10.3*  HCT 39.4* 31.1* 31.0* 31.0*  PLT 187 143* 156 185  MCV 85.5 83.8 85.2 85.4  MCH 28.1 27.3 28.8 28.3  MCHC 32.8 32.5 33.8 33.2  RDW 15.5* 15.5* 15.3* 15.5*  LYMPHSABS  --   --   --  0.9*  MONOABS  --   --   --  0.9  EOSABS  --   --   --  0.3  BASOSABS  --   --   --  0.1   ------------------------------------------------------------------------------------------------------------------  Chemistries   Recent Labs Lab 09/15/15 0601 09/16/15 0540 09/17/15 0419 09/17/15 1446 09/18/15 0440 09/19/15 0517 09/19/15 0756  NA 135  --  125*  --  134*  139 138  K 5.4* 4.7 4.2  --  4.3 4.4 4.4  CL 110  --  96*  --  108 109 109  CO2 15*  --  21*  --  20* 24 23  GLUCOSE 280*  --  138*  --  85 133* 142*  BUN 21*  --  38*  --  36* 26* 25*  CREATININE 1.23  --  2.73*  2.28* 1.89* 1.30* 1.22  CALCIUM 8.4*  --  8.1*  --  8.1* 8.7* 8.5*  MG  --   --   --  2.0  --   --   --    ------------------------------------------------------------------------------------------------------------------ estimated creatinine clearance is 50.1 mL/min (by C-G formula based on Cr of 1.22). ------------------------------------------------------------------------------------------------------------------ No results for input(s): TSH, T4TOTAL, T3FREE, THYROIDAB in the last 72 hours.  Invalid input(s): FREET3  Cardiac Enzymes  Recent Labs Lab 09/17/15 2233 09/18/15 0440 09/18/15 0942  TROPONINI 44.19* 42.30* 33.33*   ------------------------------------------------------------------------------------------------------------------ Invalid input(s): POCBNP ---------------------------------------------------------------------------------------------------------------  RADIOLOGY: US Renal  09/18/2015  CLINICAL DATA:  Acute renal failure. EXAM: RENAL / URINARY TRACT ULTRASOUND COMPLETE COMPARISON:  CT of the abdomen and pelvis 04/26/2015 FINDINGS: Right Kidney: Length: 11.1. Echogenicity within normal limits. No mass or hydronephrosis visualized. Left Kidney: Length: 11.7. Echogenicity within normal limits. No mass or hydronephrosis visualized. There is a questionable exophytic left lower pole renal mass measuring 1.9 x 1.4 x 1.4 cm. Bladder: Appears normal for degree of bladder distention. IMPRESSION: No evidence of hydronephrosis. Questionable exophytic left lower pole renal mass versus complicated cyst measuring 1.9 cm. Attention on follow-up is recommended. Electronically Signed   By: Fidela Salisbury M.D.   On: 09/18/2015 10:27    EKG:  Orders placed or performed during the hospital encounter of 09/14/15  . EKG 12-Lead  . EKG 12-Lead  . EKG 12-Lead  . EKG 12-Lead  . EKG 12-Lead immediately post procedure  . EKG 12-Lead  . EKG 12-Lead immediately post procedure  .  EKG 12-Lead  . EKG 12-Lead  . EKG 12-Lead    ASSESSMENT AND PLAN:  Active Problems:   Colon polyp   Acute ST elevation myocardial infarction (STEMI) involving right coronary artery ([place])  #1. Acute STEMI involving right coronary artery, status post cardiac catheterization and stent placement to RCA, continue patient on aspirin, Plavix, Lovenox, metoprolol, Zocor, cardiology follow-up with outpatient #2. Hyponatremia, likely due to dehydration/intravascular depletion, discontinue IV fluids,  sodium level has normalized  #3. Acute renal failure due to ATN, resolved with  IV fluid administration, appreciate nephrologist input  #4: Colon polyp, status post open right colectomy with extensive enterolysis, doin, good, had a good bowel movement , now on regular diet  #5. Generalized weakness , Patient denies any needs, likely discharge home tomorrow    Management plans discussed with the patient, family and they are in agreement.   DRUG ALLERGIES:  Allergies  Allergen Reactions  . Lipitor [Atorvastatin] Other (See Comments)    "leg cramps"  . Pravachol [Pravastatin Sodium] Other (See Comments)    "leg cramps"  . Riomet [Metformin Hcl] Diarrhea  . Vytorin [Ezetimibe-Simvastatin] Other (See Comments)    "leg cramps"    CODE STATUS:     Code Status Orders        Start     Ordered   09/17/15 1406  Full code   Continuous     09/17/15 1405    Code Status History    Date Active Date Inactive Code Status Order ID  Comments User Context   09/14/2015  4:25 PM 09/17/2015  2:05 PM Full Code [id number]  Leonie Green, MD Inpatient    Advance Directive Documentation        Most Recent Value   Type of Advance Directive  Living will   Pre-existing out of facility DNR order (yellow form or pink MOST form)     "MOST" Form in Place?        TOTAL TIME TAKING CARE OF THIS PATIENT: 30 minutes.    Theodoro Grist M.D on 09/19/2015 at 2:49 PM  Between 7am to 6pm - Pager -  [phone]  After 6pm go to [email or web address] - password [email or web address] [facility]  [facility]  Office  [phone]  CC: Primary care physician; Adin Hector, MD

## 2015-09-19 NOTE — Progress Notes (Signed)
[facility]  ROUNDING NOTE   Subjective:  Patient transitioned to floor care. Feeling well at the moment. Creatinine has come down to 1.22.   Objective:  Vital signs in last 24 hours:  Temp:  [97.3 F (36.3 C)-98.5 F (36.9 C)] 98.5 F (36.9 C) (01/22 1121) Pulse Rate:  [57-102] 74 (01/22 1121) Resp:  [16-22] 16 (01/22 1121) BP: (84-144)/(36-73) 103/58 mmHg (01/22 1121) SpO2:  [95 %-100 %] 98 % (01/22 1121) Weight:  [84.823 kg (187 lb)] 84.823 kg (187 lb) (01/21 1830)  Weight change:  Filed Weights   09/14/15 0827 09/18/15 1830  Weight: 81.647 kg (180 lb) 84.823 kg (187 lb)    Intake/Output: I/O last 3 completed shifts: In: 1303.3 [I.V.:1303.3] Out: 2575 [Urine:2575]   Intake/Output this shift:  Total I/O In: 240 [P.O.:240] Out: 200 [Urine:200]  Physical Exam: General: NAD, resting comfortably.  Head: Normocephalic, atraumatic. Moist oral mucosal membranes  Eyes: Anicteric, PERRL  Neck: Supple, trachea midline  Lungs:  Clear to auscultation, normal effort  Heart: S1S2 no rubs  Abdomen:  Soft, nontender, BS present, midline incision dressing intact  Extremities: no peripheral edema.  Neurologic: Nonfocal, moving all four extremities  Skin: No lesions  Access:     Basic Metabolic Panel:  Recent Labs Lab 09/15/15 0601 09/16/15 0540 09/17/15 0419 09/17/15 1446 09/18/15 0440 09/19/15 0517 09/19/15 0756  NA 135  --  125*  --  134* 139 138  K 5.4* 4.7 4.2  --  4.3 4.4 4.4  CL 110  --  96*  --  108 109 109  CO2 15*  --  21*  --  20* 24 23  GLUCOSE 280*  --  138*  --  85 133* 142*  BUN 21*  --  38*  --  36* 26* 25*  CREATININE 1.23  --  2.73* 2.28* 1.89* 1.30* 1.22  CALCIUM 8.4*  --  8.1*  --  8.1* 8.7* 8.5*  MG  --   --   --  2.0  --   --   --     Liver Function Tests: No results for input(s): AST, ALT, ALKPHOS, BILITOT, PROT, ALBUMIN in the last 168 hours. No results for input(s): LIPASE, AMYLASE in the last 168 hours. No results for  input(s): AMMONIA in the last 168 hours.  CBC:  Recent Labs Lab 09/15/15 0601 09/17/15 0419 09/18/15 0440 09/19/15 0517  WBC 13.0* 11.7* 7.4 7.3  NEUTROABS  --   --   --  5.0  HGB 12.9* 10.1* 10.5* 10.3*  HCT 39.4* 31.1* 31.0* 31.0*  MCV 85.5 83.8 85.2 85.4  PLT 187 143* 156 185    Cardiac Enzymes:  Recent Labs Lab 09/17/15 1446 09/17/15 2233 09/18/15 0440 09/18/15 0942  CKTOTAL 506*  --   --   --   TROPONINI 53.37* 44.19* 42.30* 33.33*    BNP: Invalid input(s): POCBNP  CBG:  Recent Labs Lab 09/18/15 1201 09/18/15 1602 09/18/15 2155 09/19/15 0730 09/19/15 1122  GLUCAP 164* 150* 120* 126* 137*    Microbiology: Results for orders placed or performed during the hospital encounter of 09/06/15  Surgical pcr screen     Status: None   Collection Time: 09/06/15  2:40 PM  Result Value Ref Range Status   MRSA, PCR NEGATIVE NEGATIVE Final   Staphylococcus aureus NEGATIVE NEGATIVE Final    Comment:        The Xpert SA Assay (FDA approved for NASAL specimens in patients over 45 years of age),  is one component of a comprehensive surveillance program.  Test performance has been validated by [facility] for patients greater than or equal to 32 year old. It is not intended to diagnose infection nor to guide or monitor treatment.     Coagulation Studies: No results for input(s): LABPROT, INR in the last 72 hours.  Urinalysis: No results for input(s): COLORURINE, LABSPEC, PHURINE, GLUCOSEU, HGBUR, BILIRUBINUR, KETONESUR, PROTEINUR, UROBILINOGEN, NITRITE, LEUKOCYTESUR in the last 72 hours.  Invalid input(s): APPERANCEUR    Imaging: US Renal  09/18/2015  CLINICAL DATA:  Acute renal failure. EXAM: RENAL / URINARY TRACT ULTRASOUND COMPLETE COMPARISON:  CT of the abdomen and pelvis 04/26/2015 FINDINGS: Right Kidney: Length: 11.1. Echogenicity within normal limits. No mass or hydronephrosis visualized. Left Kidney: Length: 11.7. Echogenicity within normal limits.  No mass or hydronephrosis visualized. There is a questionable exophytic left lower pole renal mass measuring 1.9 x 1.4 x 1.4 cm. Bladder: Appears normal for degree of bladder distention. IMPRESSION: No evidence of hydronephrosis. Questionable exophytic left lower pole renal mass versus complicated cyst measuring 1.9 cm. Attention on follow-up is recommended. Electronically Signed   By: Fidela Salisbury M.D.   On: 09/18/2015 10:27     Medications:     . aspirin  81 mg Oral Daily  . bisacodyl  10 mg Rectal Once  . clopidogrel  75 mg Oral Q breakfast  . enoxaparin (LOVENOX) injection  40 mg Subcutaneous Q24H  . feeding supplement (GLUCERNA SHAKE)  237 mL Oral TID BM  . loratadine  10 mg Oral Daily  . metoprolol tartrate  12.5 mg Oral BID  . pantoprazole  40 mg Oral QAC breakfast  . senna  1 tablet Oral BID  . simvastatin  40 mg Oral q1800  . sodium chloride  3 mL Intravenous Q12H   sodium chloride, acetaminophen **OR** acetaminophen, morphine injection, ondansetron (ZOFRAN) IV, oxyCODONE-acetaminophen, sodium chloride  Assessment/ Plan:  82 y.o. male with a PMHx of hypertension, diabetes mellitus type 2, hyperlipidemia, coronary disease status post stent placement 2, peripheral neuropathy, anemia, B12 deficiency, GERD, history of basal cell carcinoma, diverticulosis, who was admitted to [facility] on 09/14/2015 for evaluation of tubular adenoma status post right open colectomy on 09/14/2015. Hospital course complicated by post operative hypotension and acute myocardial infarction.   1. Acute renal failure due to ATN: Pt hypotensive post operative, also received contrast dye this admission. -Creatinine has improved significantly. Creatinine down to 1.2 at the moment. Continue to avoid any further nephrotoxins.  2. Hyponatremia. Sodium up to 138 today.  Was previously on IV fluid hydration.   3. Anemia unspecified.Hemoglobin currently 10.3 and remains relatively stable.  4.  Left renal  cyst/mass:  Noted on renal US, would consider CT scan with IV contrast vs MRI w/o contrast depending upon renal function improvement.     LOS: 5 Tenya Araque 1/22/20172:27 PM

## 2015-09-19 NOTE — Progress Notes (Signed)
Patient ID: Nicholas Parker, male   DOB: April 02, 1934, 82 y.o.   MRN: [id number] Pt says he is feeling much better. Had a good bm yesterday. BP stable now.  Creat 1.22.  Abdomen is soft, good bowel sounds. Incision clean and intact. Advance to solid food.

## 2015-09-20 LAB — GLUCOSE, CAPILLARY
GLUCOSE-CAPILLARY: 120 mg/dL — AB (ref 65–99)
GLUCOSE-CAPILLARY: 121 mg/dL — AB (ref 65–99)
GLUCOSE-CAPILLARY: 115 mg/dL — AB (ref 65–99)
GLUCOSE-CAPILLARY: 108 mg/dL — AB (ref 65–99)

## 2015-09-20 LAB — RENAL FUNCTION PANEL
ALBUMIN: 3 g/dL — AB (ref 3.5–5.0)
Anion gap: 5 (ref 5–15)
BUN: 20 mg/dL (ref 6–20)
CALCIUM: 8.8 mg/dL — AB (ref 8.9–10.3)
CHLORIDE: 106 mmol/L (ref 101–111)
CO2: 26 mmol/L (ref 22–32)
CREATININE: 1.03 mg/dL (ref 0.61–1.24)
GFR calc Af Amer: >60 mL/min (ref >60)
Glucose, Bld: 139 mg/dL — ABNORMAL HIGH (ref 65–99)
Phosphorus: 3.5 mg/dL (ref 2.5–4.6)
Potassium: 4.5 mmol/L (ref 3.5–5.1)
SODIUM: 137 mmol/L (ref 135–145)

## 2015-09-20 MED ORDER — GLUCERNA SHAKE PO LIQD: 237.0000 mL | Freq: Three times a day (TID) | ORAL | Status: DC

## 2015-09-20 NOTE — Progress Notes (Signed)
He reports he is tolerating his diet satisfactorily.  He is walking in the hallway.  He continues to make good progress.  I discussed his pathology of sessile tubular adenoma.  Plan on discharge tomorrow and follow-up in the office early next week

## 2015-09-20 NOTE — Progress Notes (Signed)
[facility]  ROUNDING NOTE   Subjective:   Wife at bedside. Patient seated in chair. Complains of penile edema Creatinine 1.03  Objective:  Vital signs in last 24 hours:  Temp:  [97.5 F (36.4 C)-98.5 F (36.9 C)] 98.2 F (36.8 C) (01/23 0828) Pulse Rate:  [70-93] 93 (01/23 0828) Resp:  [16-24] 24 (01/23 0828) BP: (103-123)/(58-66) 119/58 mmHg (01/23 0828) SpO2:  [96 %-98 %] 96 % (01/23 0828)  Weight change:  Filed Weights   09/14/15 0827 09/18/15 1830  Weight: 81.647 kg (180 lb) 84.823 kg (187 lb)    Intake/Output: I/O last 3 completed shifts: In: 480 [P.O.:480] Out: 1150 [Urine:1150]   Intake/Output this shift:  Total I/O In: 120 [P.O.:120] Out: 200 [Urine:200]  Physical Exam: General: NAD, resting comfortably.  Head: Normocephalic, atraumatic. Moist oral mucosal membranes  Eyes: Anicteric, PERRL  Neck: Supple, trachea midline  Lungs:  Clear to auscultation, normal effort  Heart: S1S2 no rubs  Abdomen:  Soft, nontender, BS present, midline incision dressing intact  Extremities: no peripheral edema.  Neurologic: Nonfocal, moving all four extremities  Skin: No lesions  Access:     Basic Metabolic Panel:  Recent Labs Lab 09/17/15 0419 09/17/15 1446 09/18/15 0440 09/19/15 0517 09/19/15 0756 09/20/15 0800  NA 125*  --  134* 139 138 137  K 4.2  --  4.3 4.4 4.4 4.5  CL 96*  --  108 109 109 106  CO2 21*  --  20* '24 23 26  '$ GLUCOSE 138*  --  85 133* 142* 139*  BUN 38*  --  36* 26* 25* 20  CREATININE 2.73* 2.28* 1.89* 1.30* 1.22 1.03  CALCIUM 8.1*  --  8.1* 8.7* 8.5* 8.8*  MG  --  2.0  --   --   --   --   PHOS  --   --   --   --   --  3.5    Liver Function Tests:  Recent Labs Lab 09/20/15 0800  ALBUMIN 3.0*   No results for input(s): LIPASE, AMYLASE in the last 168 hours. No results for input(s): AMMONIA in the last 168 hours.  CBC:  Recent Labs Lab 09/15/15 0601 09/17/15 0419 09/18/15 0440 09/19/15 0517  WBC 13.0* 11.7*  7.4 7.3  NEUTROABS  --   --   --  5.0  HGB 12.9* 10.1* 10.5* 10.3*  HCT 39.4* 31.1* 31.0* 31.0*  MCV 85.5 83.8 85.2 85.4  PLT 187 143* 156 185    Cardiac Enzymes:  Recent Labs Lab 09/17/15 1446 09/17/15 2233 09/18/15 0440 09/18/15 0942  CKTOTAL 506*  --   --   --   TROPONINI 53.37* 44.19* 42.30* 33.33*    BNP: Invalid input(s): POCBNP  CBG:  Recent Labs Lab 09/19/15 0730 09/19/15 1122 09/19/15 1625 09/19/15 2105 09/20/15 0722  GLUCAP 126* 137* 116* 124* 120*    Microbiology: Results for orders placed or performed during the hospital encounter of 09/06/15  Surgical pcr screen     Status: None   Collection Time: 09/06/15  2:40 PM  Result Value Ref Range Status   MRSA, PCR NEGATIVE NEGATIVE Final   Staphylococcus aureus NEGATIVE NEGATIVE Final    Comment:        The Xpert SA Assay (FDA approved for NASAL specimens in patients over 68 years of age), is one component of a comprehensive surveillance program.  Test performance has been validated by [facility] for patients greater than or equal to 3 year old. It  is not intended to diagnose infection nor to guide or monitor treatment.     Coagulation Studies: No results for input(s): LABPROT, INR in the last 72 hours.  Urinalysis: No results for input(s): COLORURINE, LABSPEC, PHURINE, GLUCOSEU, HGBUR, BILIRUBINUR, KETONESUR, PROTEINUR, UROBILINOGEN, NITRITE, LEUKOCYTESUR in the last 72 hours.  Invalid input(s): APPERANCEUR    Imaging: No results found.   Medications:     . aspirin  81 mg Oral Daily  . bisacodyl  10 mg Rectal Once  . clopidogrel  75 mg Oral Q breakfast  . enoxaparin (LOVENOX) injection  40 mg Subcutaneous Q24H  . feeding supplement (GLUCERNA SHAKE)  237 mL Oral TID BM  . loratadine  10 mg Oral Daily  . metoprolol tartrate  12.5 mg Oral BID  . pantoprazole  40 mg Oral QAC breakfast  . senna  1 tablet Oral BID  . simvastatin  40 mg Oral q1800  . sodium chloride  3 mL  Intravenous Q12H   sodium chloride, acetaminophen **OR** acetaminophen, morphine injection, ondansetron (ZOFRAN) IV, oxyCODONE-acetaminophen, sodium chloride  Assessment/ Plan:  82 y.o.white male with hypertension, diabetes mellitus type 2, hyperlipidemia, coronary disease status post stent placement 2, peripheral neuropathy, anemia, B12 deficiency, GERD, history of basal cell carcinoma, diverticulosis, who was admitted to [facility] on 09/14/2015 for evaluation of tubular adenoma status post right open colectomy on 09/14/2015. Hospital course complicated by post operative hypotension and acute myocardial infarction.   1. Acute renal failure due to ATN: Pt hypotensive post operative, also received contrast dye this admission. No evidence of chronic kidney disease.  -Creatinine has improved significantly. Creatinine down to baseline Continue to avoid any further nephrotoxins.  2. Hyponatremia. Sodium up to 137 today.  Was previously on IV fluid hydration. Encourage PO intake  3. Anemia with acute renal failure.Hemoglobin currently 10.3 and remains relatively stable. No indication for epo.   4.  Left renal cyst/mass:  Noted on renal US, will consider CT scan with IV contrast vs MRI w/o contrast as outpatient.   Will sign off. Patient will need outpatient follow up with Dr. Holley Raring.    LOS: [place], [place] 1/23/20179:38 AM

## 2015-09-20 NOTE — Progress Notes (Signed)
Initial Nutrition Assessment     INTERVENTION:  Meals and snacks: Cater to pt preferences Nutrition diet education:Pt and wife with questions regarding ht healthy diet.  Materials provided.  Discussed options and guidelines with ht healthy diet.  Encouraged pt to avoid adding additional fiber until several weeks post op secondary to recent bowel surgery.  Pt and wife verbalized understanding and expect fair compliance.    NUTRITION DIAGNOSIS:   Food and nutrition related knowledge deficit related to acute illness as evidenced by  (pt and family with questions regarding heart healthy diet).    GOAL:   Patient will meet greater than or equal to 90% of their needs    MONITOR:    (Energy intake, Digestive system)  REASON FOR ASSESSMENT:   LOS    ASSESSMENT:      Pt s/p open colectomy with extensive enterlysis. Now with post op MI  Past Medical History  Diagnosis Date  . Hypertension   . Diabetes mellitus without complication ([place])   . Hyperlipidemia   . Coronary artery disease   . neuropathy   . Low testosterone   . Neuropathy ([place])   . Anemia   . B12 deficiency   . GERD (gastroesophageal reflux disease)   . Skin cancer     Basal Cell  . Adenocarcinoma ([place])   . AAA (abdominal aortic aneurysm) ([place])   . Diverticulosis     Current Nutrition: tolerating heart healthy diet. Reports meats are tough  Food/Nutrition-Related History: normal intake prior to admission   Scheduled Medications:  . aspirin  81 mg Oral Daily  . bisacodyl  10 mg Rectal Once  . clopidogrel  75 mg Oral Q breakfast  . enoxaparin (LOVENOX) injection  40 mg Subcutaneous Q24H  . feeding supplement (GLUCERNA SHAKE)  237 mL Oral TID BM  . loratadine  10 mg Oral Daily  . metoprolol tartrate  12.5 mg Oral BID  . pantoprazole  40 mg Oral QAC breakfast  . senna  1 tablet Oral BID  . simvastatin  40 mg Oral q1800  . sodium chloride  3 mL Intravenous Q12H    Electrolyte/Renal Profile and  Glucose Profile:   Recent Labs Lab 09/17/15 1446  09/19/15 0517 09/19/15 0756 09/20/15 0800  NA  --   < > 139 138 137  K  --   < > 4.4 4.4 4.5  CL  --   < > 109 109 106  CO2  --   < > '24 23 26  '$ BUN  --   < > 26* 25* 20  CREATININE 2.28*  < > 1.30* 1.22 1.03  CALCIUM  --   < > 8.7* 8.5* 8.8*  MG 2.0  --   --   --   --   PHOS  --   --   --   --  3.5  GLUCOSE  --   < > 133* 142* 139*  < > = values in this interval not displayed. Protein Profile:  Recent Labs Lab 09/20/15 0800  ALBUMIN 3.0*    Gastrointestinal Profile: Last BM: 1/22     Weight Change: stable wt    Diet Order:  Diet Heart Room service appropriate?: Yes; Fluid consistency:: Thin  Skin:   reviewed   Height:   Ht Readings from Last 1 Encounters:  09/14/15 '5\' 6"'$  (1.676 m)    Weight:   Wt Readings from Last 1 Encounters:  09/18/15 187 lb (84.823 kg)    Ideal Body Weight:  BMI:  Body mass index is 30.2 kg/(m^2).    EDUCATION NEEDS:   Education needs addressed  LOW Care Level  Aashka Salomone B. Zenia Resides, [place], [place] (pager) Weekend/On-Call pager [phone])

## 2015-09-20 NOTE — Progress Notes (Signed)
[facility] [place] NAME: Nicholas Parker    MR#:  [id number]  DATE OF BIRTH:  [birth year]  SUBJECTIVE: I feel good  CHIEF COMPLAINT:  No chief complaint on file.  The patient is a 82 year old Caucasian male who underwent a right colectomy with extensive anterior leads. This on January 17, postoperatively, patient had chest pain and was diagnosed with acute MI with maximum troponin level of 53. Patient underwent cardiac catheterization,  thrombectomy with stent placement in RCA. Patient was noted to be in acute renal failure with creatinine level of 2.7 on January 19. At that time he was also hyponatremic. IV fluids were administered due to concerns of dehydration and patient's sodium level as well as creatinine are improving. Back on solid diet, he had 2  bowel movements yesterday and today Patient feels good today. Denies significant discomfort. Now on solid diet  , Review of Systems  Constitutional: Negative for fever, chills and weight loss.  HENT: Negative for congestion.   Eyes: Negative for blurred vision and double vision.  Respiratory: Negative for cough, sputum production, shortness of breath and wheezing.   Cardiovascular: Negative for chest pain, palpitations, orthopnea, leg swelling and PND.  Gastrointestinal: Negative for nausea, vomiting, abdominal pain, diarrhea, constipation and blood in stool.  Genitourinary: Negative for dysuria, urgency, frequency and hematuria.  Musculoskeletal: Negative for falls.  Neurological: Negative for dizziness, tremors, focal weakness and headaches.  Endo/Heme/Allergies: Does not bruise/bleed easily.  Psychiatric/Behavioral: Negative for depression. The patient does not have insomnia.     VITAL SIGNS: Blood pressure 112/63, pulse 88, temperature 98.8 F (37.1 C), temperature source Oral, resp. rate 18, height '5\' 6"'$  (1.676 m), weight 84.823 kg (187 lb), SpO2 98 %.  PHYSICAL EXAMINATION:    GENERAL:  82 y.o.-year-old patient lying in the bed with no acute distress.  EYES: Pupils equal, round, reactive to light and accommodation. No scleral icterus. Extraocular muscles intact.  HEENT: Head atraumatic, normocephalic. Oropharynx and nasopharynx clear.  NECK:  Supple, no jugular venous distention. No thyroid enlargement, no tenderness.  LUNGS: Normal breath sounds bilaterally, no wheezing, rales,rhonchi or crepitation. No use of accessory muscles of respiration.  CARDIOVASCULAR: S1, S2 normal. No murmurs, rubs, or gallops.  ABDOMEN: Soft, nontender, nondistended. Bowel sounds present. No organomegaly or mass. Well-healing midline abdominal incision was noted, no drainage, erythema, purulence or bleeding EXTREMITIES: No pedal edema, cyanosis, or clubbing.  NEUROLOGIC: Cranial nerves II through XII are intact. Muscle strength 5/5 in all extremities. Sensation intact. Gait not checked.  PSYCHIATRIC: The patient is alert and oriented x 3.  SKIN: No obvious rash, lesion, or ulcer.   ORDERS/RESULTS REVIEWED:   CBC  Recent Labs Lab 09/15/15 0601 09/17/15 0419 09/18/15 0440 09/19/15 0517  WBC 13.0* 11.7* 7.4 7.3  HGB 12.9* 10.1* 10.5* 10.3*  HCT 39.4* 31.1* 31.0* 31.0*  PLT 187 143* 156 185  MCV 85.5 83.8 85.2 85.4  MCH 28.1 27.3 28.8 28.3  MCHC 32.8 32.5 33.8 33.2  RDW 15.5* 15.5* 15.3* 15.5*  LYMPHSABS  --   --   --  0.9*  MONOABS  --   --   --  0.9  EOSABS  --   --   --  0.3  BASOSABS  --   --   --  0.1   ------------------------------------------------------------------------------------------------------------------  Chemistries   Recent Labs Lab 09/17/15 0419 09/17/15 1446 09/18/15 0440 09/19/15 0517 09/19/15 0756 09/20/15 0800  NA 125*  --  134* 139  138 137  K 4.2  --  4.3 4.4 4.4 4.5  CL 96*  --  108 109 109 106  CO2 21*  --  20* '24 23 26  '$ GLUCOSE 138*  --  85 133* 142* 139*  BUN 38*  --  36* 26* 25* 20  CREATININE 2.73* 2.28* 1.89* 1.30* 1.22 1.03   CALCIUM 8.1*  --  8.1* 8.7* 8.5* 8.8*  MG  --  2.0  --   --   --   --    ------------------------------------------------------------------------------------------------------------------ estimated creatinine clearance is 59.4 mL/min (by C-G formula based on Cr of 1.03). ------------------------------------------------------------------------------------------------------------------ No results for input(s): TSH, T4TOTAL, T3FREE, THYROIDAB in the last 72 hours.  Invalid input(s): FREET3  Cardiac Enzymes  Recent Labs Lab 09/17/15 2233 09/18/15 0440 09/18/15 0942  TROPONINI 44.19* 42.30* 33.33*   ------------------------------------------------------------------------------------------------------------------ Invalid input(s): POCBNP ---------------------------------------------------------------------------------------------------------------  RADIOLOGY: No results found.  EKG:  Orders placed or performed during the hospital encounter of 09/14/15  . EKG 12-Lead  . EKG 12-Lead  . EKG 12-Lead  . EKG 12-Lead  . EKG 12-Lead immediately post procedure  . EKG 12-Lead  . EKG 12-Lead immediately post procedure  . EKG 12-Lead  . EKG 12-Lead  . EKG 12-Lead    ASSESSMENT AND PLAN:  Active Problems:   Colon polyp   Acute ST elevation myocardial infarction (STEMI) involving right coronary artery ([place])  #1. Acute inferior wall STEMI involving right coronary artery with cardiogenic shock postoperatively, status post cardiac catheterization and stent placement to RCA, continue patient on aspirin, Plavix, Lovenox, metoprolol, Zocor, cardiology follow-up with outpatient. The patient is to ambulate and increase activity as tolerated #2. Hyponatremia, resolved now, likely due to dehydration/intravascular depletion, off IV fluids, now on regular diet  #3. Acute renal failure due to ATN, resolved with  IV fluid administration, appreciate nephrologist input  #4: Colon polyp, status post open  right colectomy with extensive enterolysis, doing good, had few good bowel movements , now on regular diet , surgery is okay for patient to be discharged tomorrow #5. Generalized weakness , Patient denies any needs, discharge home tomorrow, discussed with patient's wife today as well    Management plans discussed with the patient, family and they are in agreement.   DRUG ALLERGIES:  Allergies  Allergen Reactions  . Lipitor [Atorvastatin] Other (See Comments)    "leg cramps"  . Pravachol [Pravastatin Sodium] Other (See Comments)    "leg cramps"  . Riomet [Metformin Hcl] Diarrhea  . Vytorin [Ezetimibe-Simvastatin] Other (See Comments)    "leg cramps"    CODE STATUS:     Code Status Orders        Start     Ordered   09/17/15 1406  Full code   Continuous     09/17/15 1405    Code Status History    Date Active Date Inactive Code Status Order ID Comments User Context   09/14/2015  4:25 PM 09/17/2015  2:05 PM Full Code [id number]  Leonie Green, MD Inpatient    Advance Directive Documentation        Most Recent Value   Type of Advance Directive  Living will   Pre-existing out of facility DNR order (yellow form or pink MOST form)     "MOST" Form in Place?        TOTAL TIME TAKING CARE OF THIS PATIENT: 45 minutes.   Treatment plan as well as discharge planning was discussed with patient as well as his wife, time spent  approximately 15 minutes Ketara Cavness M.D on 09/20/2015 at 4:47 PM  Between 7am to 6pm - Pager - [phone]  After 6pm go to [email or web address] - password [email or web address] [facility]  [facility]  Office  [phone]  CC: Primary care physician; Adin Hector, MD

## 2015-09-20 NOTE — Care Management Note (Signed)
Case Management Note  Patient Details  Name: Nicholas Parker MRN: [id number] Date of Birth: 1933-11-27  Subjective/Objective:      Discussed discharge planning with Mr Brailsford and his wife. Provided them with a list of local home health providers. Explained that Dr Ether Griffins has ordered home health PT, RN, Aid. Left list with Mrs Capell and explained that we had to provide them with a choice of local providers although all the home health agencies perform basically the same services.  They will review the list and advise case management with their choice of providers.             Action/Plan:   Expected Discharge Date:                  Expected Discharge Plan:     In-House Referral:     Discharge planning Services     Post Acute Care Choice:    Choice offered to:     DME Arranged:    DME Agency:     HH Arranged:    [place] Agency:     Status of Service:     Medicare Important Message Given:  Yes Date Medicare IM Given:    Medicare IM give by:    Date Additional Medicare IM Given:    Additional Medicare Important Message give by:     If discussed at [place] of Stay Meetings, dates discussed:    Additional Comments:  Letizia Hook A, RN 09/20/2015, 4:10 PM

## 2015-09-20 NOTE — Care Management Important Message (Signed)
Important Message  Patient Details  Name: Nicholas Parker MRN: [id number] Date of Birth: 1934-08-03   Medicare Important Message Given:  Yes    [name] A, RN 09/20/2015, 8:31 AM

## 2015-09-20 NOTE — Progress Notes (Signed)
He is now 6 days after laparoscopy converted to open right colectomy and extensive enterolysis.  He did have postoperative myocardial infarction with cardiogenic shock and acute renal failure.  He now appears to be improved having no chest pain.  His renal function has improved.  He has been taking some solid food.  He had 2 bowel movements over the weekend.  He reports some minimal swelling of his penis.  On examination he is up in a chair awake alert and oriented and in no acute distress.  His incision is healing satisfactorily.  The abdomen is soft and nontender.  There is very minimal degree of swelling in the penis which does not appear to be infection  Pathology demonstrated benign adenomatous polyp of the right colon.  Will continue with solid food today.  Encourage walking in the hallway.  It appears that from the surgical standpoint he can probably be discharged tomorrow depending on continued satisfactory oral intake walking in the hallway and cardiology recommendations

## 2015-09-21 LAB — GLUCOSE, CAPILLARY
GLUCOSE-CAPILLARY: 121 mg/dL — AB (ref 65–99)
Glucose-Capillary: 88 mg/dL (ref 65–99)

## 2015-09-21 LAB — CBC
HEMATOCRIT: 31.8 % — AB (ref 40.0–52.0)
HEMOGLOBIN: 10.5 g/dL — AB (ref 13.0–18.0)
MCH: 28.2 pg (ref 26.0–34.0)
MCHC: 33.1 g/dL (ref 32.0–36.0)
MCV: 85.1 fL (ref 80.0–100.0)
Platelets: 205 10*3/uL (ref 150–440)
RBC: 3.74 MIL/uL — AB (ref 4.40–5.90)
RDW: 15.1 % — ABNORMAL HIGH (ref 11.5–14.5)
WBC: 7 10*3/uL (ref 3.8–10.6)

## 2015-09-21 MED ORDER — OXYCODONE-ACETAMINOPHEN 5-325 MG PO TABS: 1.0000 | ORAL_TABLET | Freq: Two times a day (BID) | ORAL | Status: DC | PRN

## 2015-09-21 MED ORDER — CLOPIDOGREL BISULFATE 75 MG PO TABS: 75.0000 mg | ORAL_TABLET | Freq: Every day | ORAL | Status: DC

## 2015-09-21 NOTE — Discharge Summary (Signed)
Physician Discharge Summary  Patient ID: Nicholas Parker MRN: [id number] DOB/AGE: 1934/05/19 82 y.o.  Admit date: 09/14/2015 Discharge date: 09/21/2015  Admission Diagnoses: Tubular adenoma of the right colon  Discharge Diagnoses: Tubular adenoma the right colon Myocardial infarction Acute renal failure Hyponatremia   Active Problems:   Colon polyp   Acute ST elevation myocardial infarction (STEMI) involving right coronary artery [facility])   Discharged Condition: stable  Hospital Course: He had a recent colonoscopy with findings of tubular adenoma of the right colon. He had bowel preparation at home and was brought in through the outpatient surgery department and had laparoscopy with extensive lysis of adhesions and was converted to open laparotomy with right colectomy. Postoperatively he was treated with subcutaneous Lovenox. He developed hypotension and oliguria and had EKG findings of myocardial infarction. He also had transient acute renal failure which resolved. He had cardiac catheterization with angioplasty and insertion of a stent in the right coronary artery. He was monitored in the intensive care unit and later on the telemetry unit and made satisfactory progress. He has been taken solid food and has had 2 bowel movements since surgery. He is walking in the hallway. He reports no chest pain. No difficulty breathing.  Significant Diagnostic Studies: radiology:  cardiac catheterization  Discharge Exam: Blood pressure 136/71, pulse 80, temperature 97.8 F (36.6 C), temperature source Oral, resp. rate 20, height '5\' 6"'$  (1.676 m), weight 84.823 kg (187 lb), SpO2 98 %.  he is awake alert and oriented. Vital signs stable. Lung sounds are clear. Heart regular rhythm S1 and S2. Abdomen is soft with no tenderness. His incisions are healing satisfactorily.  Disposition: Home and self-care    Medication List    STOP taking these medications        lisinopril 5 MG tablet  Commonly known  as:  PRINIVIL,ZESTRIL      TAKE these medications        aspirin EC 81 MG tablet  Take 81 mg by mouth daily.     clopidogrel 75 MG tablet  Commonly known as:  PLAVIX  Take 1 tablet (75 mg total) by mouth daily with breakfast.     glimepiride 4 MG tablet  Commonly known as:  AMARYL  Take 4 mg by mouth daily with breakfast.     loratadine 10 MG tablet  Commonly known as:  CLARITIN  Take 10 mg by mouth daily.     metFORMIN 500 MG tablet  Commonly known as:  GLUCOPHAGE  Take 500 mg by mouth 2 (two) times daily with a meal.     metoprolol succinate 25 MG 24 hr tablet  Commonly known as:  TOPROL-XL  Take 25 mg by mouth daily.     oxyCODONE-acetaminophen 5-325 MG tablet  Commonly known as:  PERCOCET/ROXICET  Take by mouth every 4 (four) hours as needed for severe pain.     oxyCODONE-acetaminophen 5-325 MG tablet  Commonly known as:  PERCOCET/ROXICET  Take 1 tablet by mouth 2 (two) times daily as needed for moderate pain.     pantoprazole 40 MG tablet  Commonly known as:  PROTONIX  Take 40 mg by mouth daily.     simvastatin 40 MG tablet  Commonly known as:  ZOCOR  Take 40 mg by mouth daily at 6 PM.     vitamin B-12 250 MCG tablet  Commonly known as:  CYANOCOBALAMIN  Take 500 mcg by mouth daily.           Follow-up Information  Follow up with Rochel Brome, MD In 1 week.   Specialty:  Surgery   Contact information:   [place] [address] [phone]       Signed: Rochel Brome 09/21/2015, 10:17 AM

## 2015-09-21 NOTE — Care Management Note (Signed)
Case Management Note  Patient Details  Name: Nicholas Parker MRN: [id number] Date of Birth: Oct 12, 1933  Subjective/Objective:  Spoke with wife and patient. Discussed home health and benefits. Both refused any home care services. Wife states she is able to monitor the wound and he has had a MI with stents before.                   Action/Plan: No home health- patient refused.  Expected Discharge Date:                  Expected Discharge Plan:  Home/Self Care  In-House Referral:     Discharge planning Services  CM Consult  Post Acute Care Choice:  Home Health Choice offered to:  Patient, Spouse  DME Arranged:    DME Agency:     HH Arranged:  Patient Refused [place] Agency:     Status of Service:  Completed, signed off  Medicare Important Message Given:  Yes Date Medicare IM Given:    Medicare IM give by:    Date Additional Medicare IM Given:    Additional Medicare Important Message give by:     If discussed at [place] of Stay Meetings, dates discussed:    Additional Comments:  Jolly Mango, RN 09/21/2015, 10:55 AM

## 2015-09-21 NOTE — Progress Notes (Signed)
[facility] [place] NAME: Nicholas Parker    MR#:  [id number]  DATE OF BIRTH:  08-11-1934  SUBJECTIVE: I feel good  CHIEF COMPLAINT:  No chief complaint on file.  The patient is a 82 year old Caucasian male who underwent a right colectomy with extensive anterior leads. This on January 17, postoperatively, patient had chest pain and was diagnosed with acute MI with maximum troponin level of 53. Patient underwent cardiac catheterization,  thrombectomy with stent placement in RCA. Patient was noted to be in acute renal failure with creatinine level of 2.7 on January 19. At that time he was also hyponatremic. IV fluids were administered due to concerns of dehydration and patient's sodium level as well as creatinine are improving. Back on solid diet, he had 2  bowel movements yesterday and today Patient feels good today. Denies significant discomfort. Now on solid diet. Wants to go home  , Review of Systems  Constitutional: Negative for fever, chills and weight loss.  HENT: Negative for congestion.   Eyes: Negative for blurred vision and double vision.  Respiratory: Negative for cough, sputum production, shortness of breath and wheezing.   Cardiovascular: Negative for chest pain, palpitations, orthopnea, leg swelling and PND.  Gastrointestinal: Negative for nausea, vomiting, abdominal pain, diarrhea, constipation and blood in stool.  Genitourinary: Negative for dysuria, urgency, frequency and hematuria.  Musculoskeletal: Negative for falls.  Neurological: Negative for dizziness, tremors, focal weakness and headaches.  Endo/Heme/Allergies: Does not bruise/bleed easily.  Psychiatric/Behavioral: Negative for depression. The patient does not have insomnia.     VITAL SIGNS: Blood pressure 111/57, pulse 76, temperature 98.2 F (36.8 C), temperature source Oral, resp. rate 18, height '5\' 6"'$  (1.676 m), weight 84.823 kg (187 lb), SpO2 99 %.  PHYSICAL  EXAMINATION:   GENERAL:  82 y.o.-year-old patient lying in the bed with no acute distress.  EYES: Pupils equal, round, reactive to light and accommodation. No scleral icterus. Extraocular muscles intact.  HEENT: Head atraumatic, normocephalic. Oropharynx and nasopharynx clear.  NECK:  Supple, no jugular venous distention. No thyroid enlargement, no tenderness.  LUNGS: Normal breath sounds bilaterally, no wheezing, rales,rhonchi or crepitation. No use of accessory muscles of respiration.  CARDIOVASCULAR: S1, S2 normal. No murmurs, rubs, or gallops.  ABDOMEN: Soft, nontender, nondistended. Bowel sounds present. No organomegaly or mass. Well-healing midline abdominal incision was noted, no drainage, erythema, purulence or bleeding EXTREMITIES: No pedal edema, cyanosis, or clubbing.  NEUROLOGIC: Cranial nerves II through XII are intact. Muscle strength 5/5 in all extremities. Sensation intact. Gait not checked.  PSYCHIATRIC: The patient is alert and oriented x 3.  SKIN: No obvious rash, lesion, or ulcer.   ORDERS/RESULTS REVIEWED:   CBC  Recent Labs Lab 09/15/15 0601 09/17/15 0419 09/18/15 0440 09/19/15 0517 09/21/15 0511  WBC 13.0* 11.7* 7.4 7.3 7.0  HGB 12.9* 10.1* 10.5* 10.3* 10.5*  HCT 39.4* 31.1* 31.0* 31.0* 31.8*  PLT 187 143* 156 185 205  MCV 85.5 83.8 85.2 85.4 85.1  MCH 28.1 27.3 28.8 28.3 28.2  MCHC 32.8 32.5 33.8 33.2 33.1  RDW 15.5* 15.5* 15.3* 15.5* 15.1*  LYMPHSABS  --   --   --  0.9*  --   MONOABS  --   --   --  0.9  --   EOSABS  --   --   --  0.3  --   BASOSABS  --   --   --  0.1  --    ------------------------------------------------------------------------------------------------------------------  Chemistries   Recent Labs Lab 09/17/15 0419 09/17/15 1446 09/18/15 0440 09/19/15 0517 09/19/15 0756 09/20/15 0800  NA 125*  --  134* 139 138 137  K 4.2  --  4.3 4.4 4.4 4.5  CL 96*  --  108 109 109 106  CO2 21*  --  20* '24 23 26  '$ GLUCOSE 138*  --  85 133*  142* 139*  BUN 38*  --  36* 26* 25* 20  CREATININE 2.73* 2.28* 1.89* 1.30* 1.22 1.03  CALCIUM 8.1*  --  8.1* 8.7* 8.5* 8.8*  MG  --  2.0  --   --   --   --    ------------------------------------------------------------------------------------------------------------------ estimated creatinine clearance is 58.4 mL/min (by C-G formula based on Cr of 1.03). ------------------------------------------------------------------------------------------------------------------ No results for input(s): TSH, T4TOTAL, T3FREE, THYROIDAB in the last 72 hours.  Invalid input(s): FREET3  Cardiac Enzymes  Recent Labs Lab 09/17/15 2233 09/18/15 0440 09/18/15 0942  TROPONINI 44.19* 42.30* 33.33*   ------------------------------------------------------------------------------------------------------------------ Invalid input(s): POCBNP ---------------------------------------------------------------------------------------------------------------  RADIOLOGY: No results found.  EKG:  Orders placed or performed during the hospital encounter of 09/14/15  . EKG 12-Lead  . EKG 12-Lead  . EKG 12-Lead  . EKG 12-Lead  . EKG 12-Lead immediately post procedure  . EKG 12-Lead  . EKG 12-Lead immediately post procedure  . EKG 12-Lead  . EKG 12-Lead  . EKG 12-Lead    ASSESSMENT AND PLAN:  Active Problems:   Colon polyp   Acute ST elevation myocardial infarction (STEMI) involving right coronary artery ([place])  #1. Acute inferior wall STEMI involving right coronary artery with cardiogenic shock postoperatively, status post cardiac catheterization and stent placement to RCA, continue patient on aspirin, Plavix,  metoprolol, Zocor, cardiology follow-up with outpatient. The patient is to ambulate and increase activity as tolerated #2. Hyponatremia, resolved now, likely due to dehydration/intravascular depletion, off IV fluids, now on regular diet  #3. Acute renal failure due to ATN, resolved with  IV fluid  administration, appreciate nephrologist input  #4: Colon polyp, status post open right colectomy with extensive enterolysis, doing good, had few bowel movements already, is tolerating regular diet , surgery is  discharging patient home today  #5. Generalized weakness , Patient is being discharged with home health services   Management plans discussed with the patient, family and they are in agreement.   DRUG ALLERGIES:  Allergies  Allergen Reactions  . Lipitor [Atorvastatin] Other (See Comments)    "leg cramps"  . Pravachol [Pravastatin Sodium] Other (See Comments)    "leg cramps"  . Riomet [Metformin Hcl] Diarrhea  . Vytorin [Ezetimibe-Simvastatin] Other (See Comments)    "leg cramps"    CODE STATUS:     Code Status Orders        Start     Ordered   09/17/15 1406  Full code   Continuous     09/17/15 1405    Code Status History    Date Active Date Inactive Code Status Order ID Comments User Context   09/14/2015  4:25 PM 09/17/2015  2:05 PM Full Code [id number]  Leonie Green, MD Inpatient    Advance Directive Documentation        Most Recent Value   Type of Advance Directive  Living will   Pre-existing out of facility DNR order (yellow form or pink MOST form)     "MOST" Form in Place?        TOTAL TIME TAKING CARE OF THIS PATIENT: 30 minutes.  Theodoro Grist M.D on 09/21/2015 at 5:20 PM  Between 7am to 6pm - Pager - [phone]  After 6pm go to [email or web address] - password [email or web address]  [place] [facility]  Office  [phone]  CC: Primary care physician; Adin Hector, MD

## 2015-09-21 NOTE — Discharge Instructions (Signed)
Take Tylenol or Percocet if needed for pain.  Take Plavix 75 mg daily. Take aspirin 81 mg daily.  Take a low-salt diet.  Avoid straining and heavy lifting.  Make an appointment with Dr. Nehemiah Massed for 1 or 2 weeks.  Make an appointment with Dr. Tamala Julian Monday, January 30.

## 2015-09-21 NOTE — Progress Notes (Signed)
Discharge instructions given. IV's and tele removed. Prescriptions given to patient. Education on surgical incision and post angiogram given. Patient has no further questions. Wife is here to take patient home.

## 2015-09-22 NOTE — Progress Notes (Signed)
Subjective:  Status post STEMI PCI and stent to RCA with DES patient feels better still weak but much improved  Objective:  Vital Signs in the last 24 hours:    Intake/Output from previous day: 01/24 0701 - 01/25 0700 In: 243 [P.O.:240; I.V.:3] Out: 125 [Urine:125] Intake/Output from this shift: Total I/O In: 243 [P.O.:240; I.V.:3] Out: 875 [Urine:875]  Physical Exam: General appearance: cooperative and appears stated age Neck: no adenopathy, no carotid bruit, no JVD, supple, symmetrical, trachea midline and thyroid not enlarged, symmetric, no tenderness/mass/nodules Lungs: clear to auscultation bilaterally Heart: regular rate and rhythm, S1, S2 normal, no murmur, click, rub or gallop Abdomen: Mild tenderness postop from gallbladder surgery Extremities: extremities normal, atraumatic, no cyanosis or edema Pulses: 2+ and symmetric Skin: Skin color, texture, turgor normal. No rashes or lesions Neurologic: Alert and oriented X 3, normal strength and tone. Normal symmetric reflexes. Normal coordination and gait  Lab Results:  Recent Labs  09/21/15 0511  WBC 7.0  HGB 10.5*  PLT 205    Recent Labs  09/20/15 0800  NA 137  K 4.5  CL 106  CO2 26  GLUCOSE 139*  BUN 20  CREATININE 1.03   No results for input(s): TROPONINI in the last 72 hours.  Invalid input(s): CK, MB Hepatic Function Panel  Recent Labs  09/20/15 0800  ALBUMIN 3.0*   No results for input(s): CHOL in the last 72 hours. No results for input(s): PROTIME in the last 72 hours.  Imaging: Imaging results have been reviewed  Cardiac Studies:  Assessment/Plan:  Angina Chest Pain CHF Coronary Artery Disease Coronary Artery Stent Hypotension/Shock Ischemic Heart Disease MI   status post STEMI in house  diabetes type 2 uncomplicated  postop gallbladder surgery  GERD  hyperlipidemia . PLAN  continue Plavix postop  aspirin therapy 325 once a day  continue Protonix therapy for reflux  symptoms  post MI hypertension metoprolol should be continued  diabetes continue glimepiride and metformin therapy  hyperlipidemia continue simvastatin therapy  recommend transfer the patient to telemetry increase ambulation  LOS: 7 days    Nicholas Parker D. 09/22/2015, 4:50 PM

## 2015-09-24 ENCOUNTER — Inpatient Hospital Stay: Payer: Commercial Managed Care - HMO

## 2015-09-24 ENCOUNTER — Encounter: Payer: Self-pay | Admitting: *Deleted

## 2015-09-24 ENCOUNTER — Emergency Department: Payer: Commercial Managed Care - HMO

## 2015-09-24 ENCOUNTER — Inpatient Hospital Stay
Admission: EM | Admit: 2015-09-24 | Discharge: 2015-10-04 | DRG: 862 | Disposition: A | Discharge status: Home / Self Care | Payer: Commercial Managed Care - HMO | Attending: Internal Medicine | Admitting: Internal Medicine

## 2015-09-24 DIAGNOSIS — R109 Unspecified abdominal pain: Secondary | ICD-10-CM

## 2015-09-24 DIAGNOSIS — Z4682 Encounter for fitting and adjustment of non-vascular catheter: Secondary | ICD-10-CM | POA: Diagnosis not present

## 2015-09-24 DIAGNOSIS — Z86010 Personal history of colonic polyps: Secondary | ICD-10-CM | POA: Diagnosis not present

## 2015-09-24 DIAGNOSIS — I5023 Acute on chronic systolic (congestive) heart failure: Secondary | ICD-10-CM | POA: Diagnosis not present

## 2015-09-24 DIAGNOSIS — E785 Hyperlipidemia, unspecified: Secondary | ICD-10-CM | POA: Diagnosis present

## 2015-09-24 DIAGNOSIS — K913 Postprocedural intestinal obstruction: Secondary | ICD-10-CM | POA: Diagnosis present

## 2015-09-24 DIAGNOSIS — T814XXA Infection following a procedure, initial encounter: Secondary | ICD-10-CM | POA: Diagnosis not present

## 2015-09-24 DIAGNOSIS — I472 Ventricular tachycardia: Secondary | ICD-10-CM | POA: Diagnosis not present

## 2015-09-24 DIAGNOSIS — B952 Enterococcus as the cause of diseases classified elsewhere: Secondary | ICD-10-CM | POA: Diagnosis present

## 2015-09-24 DIAGNOSIS — I429 Cardiomyopathy, unspecified: Secondary | ICD-10-CM | POA: Diagnosis not present

## 2015-09-24 DIAGNOSIS — Z87891 Personal history of nicotine dependence: Secondary | ICD-10-CM

## 2015-09-24 DIAGNOSIS — R570 Cardiogenic shock: Secondary | ICD-10-CM | POA: Diagnosis present

## 2015-09-24 DIAGNOSIS — Y838 Other surgical procedures as the cause of abnormal reaction of the patient, or of later complication, without mention of misadventure at the time of the procedure: Secondary | ICD-10-CM | POA: Diagnosis present

## 2015-09-24 DIAGNOSIS — N179 Acute kidney failure, unspecified: Secondary | ICD-10-CM | POA: Diagnosis not present

## 2015-09-24 DIAGNOSIS — I110 Hypertensive heart disease with heart failure: Secondary | ICD-10-CM | POA: Diagnosis present

## 2015-09-24 DIAGNOSIS — L02211 Cutaneous abscess of abdominal wall: Secondary | ICD-10-CM | POA: Diagnosis present

## 2015-09-24 DIAGNOSIS — Z955 Presence of coronary angioplasty implant and graft: Secondary | ICD-10-CM

## 2015-09-24 DIAGNOSIS — B9620 Unspecified Escherichia coli [E. coli] as the cause of diseases classified elsewhere: Secondary | ICD-10-CM | POA: Diagnosis present

## 2015-09-24 DIAGNOSIS — Z7982 Long term (current) use of aspirin: Secondary | ICD-10-CM | POA: Diagnosis not present

## 2015-09-24 DIAGNOSIS — N289 Disorder of kidney and ureter, unspecified: Secondary | ICD-10-CM

## 2015-09-24 DIAGNOSIS — I4891 Unspecified atrial fibrillation: Secondary | ICD-10-CM | POA: Diagnosis not present

## 2015-09-24 DIAGNOSIS — R0602 Shortness of breath: Secondary | ICD-10-CM | POA: Diagnosis not present

## 2015-09-24 DIAGNOSIS — Z9049 Acquired absence of other specified parts of digestive tract: Secondary | ICD-10-CM

## 2015-09-24 DIAGNOSIS — K219 Gastro-esophageal reflux disease without esophagitis: Secondary | ICD-10-CM | POA: Diagnosis present

## 2015-09-24 DIAGNOSIS — I2119 ST elevation (STEMI) myocardial infarction involving other coronary artery of inferior wall: Secondary | ICD-10-CM | POA: Diagnosis not present

## 2015-09-24 DIAGNOSIS — I952 Hypotension due to drugs: Secondary | ICD-10-CM | POA: Diagnosis not present

## 2015-09-24 DIAGNOSIS — L0291 Cutaneous abscess, unspecified: Secondary | ICD-10-CM | POA: Diagnosis not present

## 2015-09-24 DIAGNOSIS — R140 Abdominal distension (gaseous): Secondary | ICD-10-CM | POA: Diagnosis not present

## 2015-09-24 DIAGNOSIS — G8918 Other acute postprocedural pain: Secondary | ICD-10-CM | POA: Diagnosis present

## 2015-09-24 DIAGNOSIS — N19 Unspecified kidney failure: Secondary | ICD-10-CM | POA: Diagnosis present

## 2015-09-24 DIAGNOSIS — E860 Dehydration: Secondary | ICD-10-CM | POA: Diagnosis present

## 2015-09-24 DIAGNOSIS — Z452 Encounter for adjustment and management of vascular access device: Secondary | ICD-10-CM | POA: Diagnosis not present

## 2015-09-24 DIAGNOSIS — R600 Localized edema: Secondary | ICD-10-CM | POA: Diagnosis not present

## 2015-09-24 DIAGNOSIS — B9689 Other specified bacterial agents as the cause of diseases classified elsewhere: Secondary | ICD-10-CM | POA: Diagnosis present

## 2015-09-24 DIAGNOSIS — Z7984 Long term (current) use of oral hypoglycemic drugs: Secondary | ICD-10-CM

## 2015-09-24 DIAGNOSIS — I255 Ischemic cardiomyopathy: Secondary | ICD-10-CM | POA: Diagnosis present

## 2015-09-24 DIAGNOSIS — K5669 Other intestinal obstruction: Secondary | ICD-10-CM | POA: Diagnosis not present

## 2015-09-24 DIAGNOSIS — R609 Edema, unspecified: Secondary | ICD-10-CM

## 2015-09-24 DIAGNOSIS — K651 Peritoneal abscess: Secondary | ICD-10-CM | POA: Diagnosis not present

## 2015-09-24 DIAGNOSIS — K8020 Calculus of gallbladder without cholecystitis without obstruction: Secondary | ICD-10-CM | POA: Diagnosis not present

## 2015-09-24 DIAGNOSIS — I2510 Atherosclerotic heart disease of native coronary artery without angina pectoris: Secondary | ICD-10-CM | POA: Diagnosis not present

## 2015-09-24 DIAGNOSIS — E1140 Type 2 diabetes mellitus with diabetic neuropathy, unspecified: Secondary | ICD-10-CM | POA: Diagnosis present

## 2015-09-24 LAB — CBC WITH DIFFERENTIAL/PLATELET
BASOS ABS: 0.1 10*3/uL (ref 0–0.1)
EOS ABS: 0 10*3/uL (ref 0–0.7)
HCT: 37.1 % — ABNORMAL LOW (ref 40.0–52.0)
Hemoglobin: 12.3 g/dL — ABNORMAL LOW (ref 13.0–18.0)
Lymphocytes Relative: 6 %
Lymphs Abs: 1.1 10*3/uL (ref 1.0–3.6)
MCH: 28 pg (ref 26.0–34.0)
MCHC: 33.3 g/dL (ref 32.0–36.0)
MCV: 84.2 fL (ref 80.0–100.0)
MONO ABS: 0.7 10*3/uL (ref 0.2–1.0)
Monocytes Relative: 4 %
Neutro Abs: 16.3 10*3/uL — ABNORMAL HIGH (ref 1.4–6.5)
Neutrophils Relative %: 89 %
PLATELETS: 489 10*3/uL — AB (ref 150–440)
RBC: 4.41 MIL/uL (ref 4.40–5.90)
RDW: 15.7 % — AB (ref 11.5–14.5)
WBC: 18.3 10*3/uL — AB (ref 3.8–10.6)

## 2015-09-24 LAB — COMPREHENSIVE METABOLIC PANEL
ALT: 20 U/L (ref 17–63)
AST: 36 U/L (ref 15–41)
Albumin: 3.6 g/dL (ref 3.5–5.0)
Alkaline Phosphatase: 55 U/L (ref 38–126)
Anion gap: 16 — ABNORMAL HIGH (ref 5–15)
BUN: 29 mg/dL — AB (ref 6–20)
CHLORIDE: 99 mmol/L — AB (ref 101–111)
CO2: 22 mmol/L (ref 22–32)
CREATININE: 2.15 mg/dL — AB (ref 0.61–1.24)
Calcium: 9 mg/dL (ref 8.9–10.3)
GFR calc Af Amer: 32 mL/min — ABNORMAL LOW (ref >60)
GFR, EST NON AFRICAN AMERICAN: 27 mL/min — AB (ref >60)
Glucose, Bld: 166 mg/dL — ABNORMAL HIGH (ref 65–99)
POTASSIUM: 3.9 mmol/L (ref 3.5–5.1)
SODIUM: 137 mmol/L (ref 135–145)
Total Bilirubin: 1.3 mg/dL — ABNORMAL HIGH (ref 0.3–1.2)
Total Protein: 7.3 g/dL (ref 6.5–8.1)

## 2015-09-24 LAB — LIPASE, BLOOD: LIPASE: 15 U/L (ref 11–51)

## 2015-09-24 LAB — GLUCOSE, CAPILLARY
GLUCOSE-CAPILLARY: 107 mg/dL — AB (ref 65–99)
Glucose-Capillary: 96 mg/dL (ref 65–99)

## 2015-09-24 LAB — TROPONIN I
TROPONIN I: 5.05 ng/mL — AB (ref <0.031)
Troponin I: 5.15 ng/mL — ABNORMAL HIGH (ref <0.031)

## 2015-09-24 MED ORDER — MIDAZOLAM HCL 2 MG/2ML IJ SOLN
INTRAMUSCULAR | Status: AC
  Filled 2015-09-24: qty 2

## 2015-09-24 MED ORDER — ACETAMINOPHEN 325 MG PO TABS
650.0000 mg | ORAL_TABLET | Freq: Four times a day (QID) | ORAL | Status: DC | PRN
  Administered 2015-09-28: 650 mg via ORAL
  Filled 2015-09-24: qty 2

## 2015-09-24 MED ORDER — FENTANYL CITRATE (PF) 100 MCG/2ML IJ SOLN
100.0000 ug | Freq: Once | INTRAMUSCULAR | Status: AC
  Administered 2015-09-24: 100 ug via INTRAVENOUS
  Filled 2015-09-24: qty 2

## 2015-09-24 MED ORDER — CLOPIDOGREL BISULFATE 75 MG PO TABS
75.0000 mg | ORAL_TABLET | Freq: Once | ORAL | Status: AC
Start: 2015-09-24 — End: 2015-09-24
  Administered 2015-09-24: 75 mg via ORAL
  Filled 2015-09-24: qty 1

## 2015-09-24 MED ORDER — ONDANSETRON HCL 4 MG/2ML IJ SOLN: 4.0000 mg | Freq: Four times a day (QID) | INTRAMUSCULAR | Status: DC | PRN

## 2015-09-24 MED ORDER — FENTANYL CITRATE (PF) 100 MCG/2ML IJ SOLN
100.0000 ug | Freq: Once | INTRAMUSCULAR | Status: AC
Start: 2015-09-24 — End: 2015-09-24
  Administered 2015-09-24: 100 ug via INTRAVENOUS

## 2015-09-24 MED ORDER — FENTANYL CITRATE (PF) 100 MCG/2ML IJ SOLN
INTRAMUSCULAR | Status: AC
  Filled 2015-09-24: qty 2

## 2015-09-24 MED ORDER — SODIUM CHLORIDE 0.9% FLUSH
3.0000 mL | Freq: Two times a day (BID) | INTRAVENOUS | Status: DC
  Administered 2015-09-24 – 2015-10-02 (×14): 3 mL via INTRAVENOUS

## 2015-09-24 MED ORDER — ONDANSETRON HCL 4 MG PO TABS: 4.0000 mg | ORAL_TABLET | Freq: Four times a day (QID) | ORAL | Status: DC | PRN

## 2015-09-24 MED ORDER — HEPARIN SODIUM (PORCINE) 1000 UNIT/ML IJ SOLN
INTRAMUSCULAR | Status: AC
Start: 2015-09-24 — End: 2015-09-24
  Filled 2015-09-24: qty 1

## 2015-09-24 MED ORDER — VERAPAMIL HCL 2.5 MG/ML IV SOLN
INTRAVENOUS | Status: AC
Start: 2015-09-24 — End: 2015-09-24
  Filled 2015-09-24: qty 2

## 2015-09-24 MED ORDER — SODIUM CHLORIDE 0.9 % IV SOLN
INTRAVENOUS | Status: AC
  Administered 2015-09-24: 15:00:00 via INTRAVENOUS

## 2015-09-24 MED ORDER — SODIUM CHLORIDE 0.9 % IV BOLUS (SEPSIS)
500.0000 mL | Freq: Once | INTRAVENOUS | Status: AC
  Administered 2015-09-24: 500 mL via INTRAVENOUS

## 2015-09-24 MED ORDER — BIVALIRUDIN 250 MG IV SOLR
INTRAVENOUS | Status: AC
Start: 2015-09-24 — End: 2015-09-24
  Filled 2015-09-24: qty 250

## 2015-09-24 MED ORDER — HEPARIN (PORCINE) IN NACL 2-0.9 UNIT/ML-% IJ SOLN
INTRAMUSCULAR | Status: AC
Start: 2015-09-24 — End: 2015-09-24
  Filled 2015-09-24: qty 1000

## 2015-09-24 MED ORDER — PANTOPRAZOLE SODIUM 40 MG PO TBEC
40.0000 mg | DELAYED_RELEASE_TABLET | Freq: Every day | ORAL | Status: DC
  Administered 2015-09-24 – 2015-10-04 (×11): 40 mg via ORAL
  Filled 2015-09-24 (×11): qty 1

## 2015-09-24 MED ORDER — ACETAMINOPHEN 650 MG RE SUPP: 650.0000 mg | Freq: Four times a day (QID) | RECTAL | Status: DC | PRN

## 2015-09-24 MED ORDER — ONDANSETRON HCL 4 MG/2ML IJ SOLN
4.0000 mg | Freq: Once | INTRAMUSCULAR | Status: AC
  Administered 2015-09-24: 4 mg via INTRAVENOUS
  Filled 2015-09-24: qty 2

## 2015-09-24 MED ORDER — LORATADINE 10 MG PO TABS
10.0000 mg | ORAL_TABLET | Freq: Every day | ORAL | Status: DC
  Administered 2015-09-25 – 2015-10-04 (×10): 10 mg via ORAL
  Filled 2015-09-24 (×11): qty 1

## 2015-09-24 MED ORDER — CYANOCOBALAMIN 500 MCG PO TABS
250.0000 ug | ORAL_TABLET | Freq: Every day | ORAL | Status: DC
  Administered 2015-09-24 – 2015-10-04 (×11): 250 ug via ORAL
  Filled 2015-09-24 (×13): qty 1

## 2015-09-24 MED ORDER — SODIUM CHLORIDE 0.9 % IV BOLUS (SEPSIS)
250.0000 mL | Freq: Once | INTRAVENOUS | Status: AC
  Administered 2015-09-24: 250 mL via INTRAVENOUS

## 2015-09-24 MED ORDER — NITROGLYCERIN 2 % TD OINT: 1.0000 [in_us] | TOPICAL_OINTMENT | Freq: Four times a day (QID) | TRANSDERMAL | Status: DC

## 2015-09-24 MED ORDER — INSULIN ASPART 100 UNIT/ML ~~LOC~~ SOLN: 0.0000 [IU] | Freq: Every day | SUBCUTANEOUS | Status: DC

## 2015-09-24 MED ORDER — INSULIN ASPART 100 UNIT/ML ~~LOC~~ SOLN
0.0000 [IU] | Freq: Three times a day (TID) | SUBCUTANEOUS | Status: DC
  Administered 2015-09-25 – 2015-09-27 (×4): 2 [IU] via SUBCUTANEOUS
  Administered 2015-09-27 (×2): 3 [IU] via SUBCUTANEOUS
  Administered 2015-09-28 (×2): 2 [IU] via SUBCUTANEOUS
  Administered 2015-09-28: 3 [IU] via SUBCUTANEOUS
  Administered 2015-09-29: 2 [IU] via SUBCUTANEOUS
  Administered 2015-09-30: 3 [IU] via SUBCUTANEOUS
  Administered 2015-10-01: 2 [IU] via SUBCUTANEOUS
  Administered 2015-10-02 (×2): 3 [IU] via SUBCUTANEOUS
  Administered 2015-10-03: 2 [IU] via SUBCUTANEOUS
  Filled 2015-09-24: qty 2
  Filled 2015-09-24: qty 1
  Filled 2015-09-24: qty 3
  Filled 2015-09-24 (×2): qty 2
  Filled 2015-09-24: qty 3
  Filled 2015-09-24: qty 2
  Filled 2015-09-24: qty 3
  Filled 2015-09-24: qty 2
  Filled 2015-09-24: qty 3
  Filled 2015-09-24 (×2): qty 2
  Filled 2015-09-24: qty 3
  Filled 2015-09-24: qty 2
  Filled 2015-09-24: qty 3
  Filled 2015-09-24: qty 2

## 2015-09-24 MED ORDER — CLOPIDOGREL BISULFATE 75 MG PO TABS: 75.0000 mg | ORAL_TABLET | Freq: Every day | ORAL | Status: DC

## 2015-09-24 MED ORDER — HYDROMORPHONE HCL 1 MG/ML IJ SOLN
1.0000 mg | Freq: Once | INTRAMUSCULAR | Status: AC
  Administered 2015-09-24: 1 mg via INTRAVENOUS
  Filled 2015-09-24: qty 1

## 2015-09-24 MED ORDER — IOHEXOL 240 MG/ML SOLN
25.0000 mL | INTRAMUSCULAR | Status: AC
  Administered 2015-09-24 (×2): 25 mL via ORAL

## 2015-09-24 MED ORDER — ENOXAPARIN SODIUM 30 MG/0.3ML ~~LOC~~ SOLN
30.0000 mg | SUBCUTANEOUS | Status: DC
Start: 2015-09-24 — End: 2015-09-25
  Administered 2015-09-24: 30 mg via SUBCUTANEOUS
  Filled 2015-09-24: qty 0.3

## 2015-09-24 MED ORDER — OXYCODONE-ACETAMINOPHEN 5-325 MG PO TABS: 1.0000 | ORAL_TABLET | Freq: Two times a day (BID) | ORAL | Status: DC | PRN

## 2015-09-24 MED ORDER — NITROGLYCERIN 5 MG/ML IV SOLN
INTRAVENOUS | Status: AC
  Filled 2015-09-24: qty 10

## 2015-09-24 MED ORDER — LORAZEPAM 2 MG/ML IJ SOLN
1.0000 mg | Freq: Once | INTRAMUSCULAR | Status: AC
  Administered 2015-09-24: 1 mg via INTRAVENOUS
  Filled 2015-09-24: qty 1

## 2015-09-24 MED ORDER — MORPHINE SULFATE (PF) 2 MG/ML IV SOLN
1.0000 mg | INTRAVENOUS | Status: DC | PRN
  Administered 2015-09-27 (×2): 1 mg via INTRAVENOUS
  Filled 2015-09-24 (×2): qty 1

## 2015-09-24 MED ORDER — ALUM & MAG HYDROXIDE-SIMETH 200-200-20 MG/5ML PO SUSP: 30.0000 mL | Freq: Four times a day (QID) | ORAL | Status: DC | PRN

## 2015-09-24 MED ORDER — SODIUM CHLORIDE 0.9 % IV BOLUS (SEPSIS)
500.0000 mL | INTRAVENOUS | Status: DC | PRN
  Administered 2015-09-24: 500 mL via INTRAVENOUS

## 2015-09-24 MED ORDER — CLOPIDOGREL BISULFATE 75 MG PO TABS
75.0000 mg | ORAL_TABLET | Freq: Every day | ORAL | Status: DC
  Administered 2015-09-25 – 2015-10-04 (×10): 75 mg via ORAL
  Filled 2015-09-24 (×10): qty 1

## 2015-09-24 MED ORDER — FENTANYL CITRATE (PF) 100 MCG/2ML IJ SOLN
INTRAMUSCULAR | Status: AC
  Administered 2015-09-24: 100 ug via INTRAVENOUS
  Filled 2015-09-24: qty 2

## 2015-09-24 MED ORDER — ASPIRIN EC 81 MG PO TBEC
81.0000 mg | DELAYED_RELEASE_TABLET | Freq: Every day | ORAL | Status: DC
  Administered 2015-09-24 – 2015-10-04 (×11): 81 mg via ORAL
  Filled 2015-09-24 (×11): qty 1

## 2015-09-24 MED ORDER — NITROGLYCERIN 2 % TD OINT
1.0000 [in_us] | TOPICAL_OINTMENT | Freq: Once | TRANSDERMAL | Status: AC
  Administered 2015-09-24: 1 [in_us] via TOPICAL

## 2015-09-24 MED ORDER — OXYCODONE-ACETAMINOPHEN 5-325 MG PO TABS
1.0000 | ORAL_TABLET | ORAL | Status: DC | PRN
  Administered 2015-09-24 – 2015-10-04 (×32): 1 via ORAL
  Filled 2015-09-24 (×32): qty 1

## 2015-09-24 MED ORDER — ASPIRIN 81 MG PO CHEW
324.0000 mg | CHEWABLE_TABLET | Freq: Once | ORAL | Status: AC
  Administered 2015-09-24: 324 mg via ORAL

## 2015-09-24 MED ORDER — SIMVASTATIN 40 MG PO TABS
40.0000 mg | ORAL_TABLET | Freq: Every day | ORAL | Status: DC
  Administered 2015-09-25 – 2015-10-03 (×9): 40 mg via ORAL
  Filled 2015-09-24 (×9): qty 1

## 2015-09-24 MED ORDER — NOREPINEPHRINE BITARTRATE 1 MG/ML IV SOLN
0.0000 ug/min | INTRAVENOUS | Status: DC
  Administered 2015-09-24: 2 ug/min via INTRAVENOUS
  Filled 2015-09-24: qty 4

## 2015-09-24 NOTE — Progress Notes (Signed)
Pastoral Care and prayer provided for patient and family.

## 2015-09-24 NOTE — Progress Notes (Signed)
Dr. Tamala Julian in room and gave order to place NG tube to medium continuous suction and that patient can have ice chips. Patient on 77mg/min of levophed with stable blood pressure. Wife at bedside.

## 2015-09-24 NOTE — Progress Notes (Signed)
Patient continues to have hypotension despite a total of 1750 of fluids. I have spoken with the ER physician who will place central line and we will start levo fed for map greater than 65

## 2015-09-24 NOTE — ED Notes (Signed)
MD Notified of BP Nicholas Parker

## 2015-09-24 NOTE — Progress Notes (Signed)
eLink Physician-Brief Progress Note Patient Name: Nicholas Parker DOB: 1934-02-08 MRN: [id number]   Date of Service  09/24/2015  HPI/Events of Note  82 yo male with PMH of DM-Type II, recent colon resection and post op inferior STEMI s/p DES to RCA. Presents with Abdominal pain and renal failure. Abdominal CT Scan negative. ARF felt to be d/t dehydration. Current medical regimen includes ASA, Plavix, Cyanocobalamin, Lovenox [place], Novolog SSI, Claritin, Protonix PO,  Zocor. BP = 102/63 and HR = 95. O2 sat on 2.0 L/min [place] = 100% and RR = 28. Management per Hospitalist Service.   eICU Interventions  Continue present management. Currently on low dose Norepinephrine IV infusion at 2 mcg/min. BP soft measure CVP.     Intervention Category Evaluation Type: New Patient Evaluation  Lysle Dingwall 09/24/2015, 9:11 PM

## 2015-09-24 NOTE — ED Notes (Signed)
Mody Notified of B/P

## 2015-09-24 NOTE — H&P (Signed)
History and physical :  History of Present Illness: Nicholas Parker is a 82 y.o. male. He called the office this morning to report that he has been having diffuse abdominal pain since yesterday. He took a Percocet tablet which did not help. He just recently had laparoscopy and open right hemicolectomy and extensive lysis of adhesions for adenomatous polyp of the cecum. He did have a postoperative myocardial infarction and cardiogenic shock and acute renal failure. He also was evaluated with cardiac catheterization and had findings of a stenosis and a thrombus of the right coronary artery. The thrombus was removed and the stenosis was treated with angioplasty and insertion of a drug-eluting stent. He was started on Plavix and aspirin. His renal function improved with IV fluid and creatinine returned to normal. He was discharged from the hospital 3 days ago. He has taken some solid food since discharge. He has moved his bowels since discharge. Last bowel movement was yesterday and was small in size. He also had a large amount of flatus which was passed  yesterday. He reports no bowel movement or flatus today. He reports the diffuse abdominal pain. He reports some mild nausea and it spit up just a small amount. He reports he has been voiding satisfactorily with adequate urine output. He came into the emergency room where he was initially evaluated by the emergency room staff. He has been given IV fluids and being prepared for CT scan of the abdomen and pelvis with oral contrast but no IV contrast. He has been given some Dilaudid which help to ease his pain.  Past Medical History:  . He had recent myocardial infarction as noted above. Past Medical History  Diagnosis Date  . Hypertension   . Diabetes mellitus without complication ([place])   . Hyperlipidemia   . Coronary artery disease   . neuropathy   . Low testosterone   . Neuropathy ([place])   . Anemia   . B12 deficiency   . GERD (gastroesophageal reflux  disease)   . Skin cancer     Basal Cell  . Adenocarcinoma ([place])   . AAA (abdominal aortic aneurysm) ([place])   . Diverticulosis     Problem List: Patient Active Problem List   Diagnosis Date Noted  . Renal failure 09/24/2015  . Acute ST elevation myocardial infarction (STEMI) involving right coronary artery ([place])   . Colon polyp 09/14/2015    Past Surgical History: Past Surgical History  Procedure Laterality Date  . Arthrosopic rotator cuff repair Left   . Robotic colectomy    . Coronary angioplasty    . Vascular surgery    . Colonoscopy with propofol N/A 04/13/2015    Procedure: COLONOSCOPY WITH PROPOFOL;  Surgeon: Lollie Sails, MD;  Location: [facility] ENDOSCOPY;  Service: Endoscopy;  Laterality: N/A;  . Colonoscopy with propofol N/A 04/14/2015    Procedure: COLONOSCOPY WITH PROPOFOL;  Surgeon: Lollie Sails, MD;  Location: [facility] ENDOSCOPY;  Service: Endoscopy;  Laterality: N/A;  . Colonoscopy with propofol N/A 07/20/2015    Procedure: COLONOSCOPY WITH PROPOFOL;  Surgeon: Lollie Sails, MD;  Location: [facility] ENDOSCOPY;  Service: Endoscopy;  Laterality: N/A;  . Colon surgery    . Eye surgery Bilateral     Cataract Extraction with IOL  . Abdominal aortic aneurysm repair  2014    Dr. Delana Meyer, [facility]  . Laparoscopic right colectomy Right 09/14/2015    Procedure: LAPAROSCOPIC RIGHT COLECTOMY  CONVERTED TO OPEN RIGHT COLECTOMY, LYSIS OF ADHESIONS;  Surgeon: Leonie Green,  MD;  Location: [facility] ORS;  Service: General;  Laterality: Right;  . Cardiac catheterization N/A 09/17/2015    Procedure: Left Heart Cath and Coronary Angiography;  Surgeon: Wellington Hampshire, MD;  Location: [place] CV LAB;  Service: Cardiovascular;  Laterality: N/A;  . Cardiac catheterization N/A 09/17/2015    Procedure: Coronary Stent Intervention;  Surgeon: Wellington Hampshire, MD;  Location: [place] CV LAB;  Service: Cardiovascular;  Laterality: N/A;    Allergies: Allergies  Allergen Reactions  .  Lipitor [Atorvastatin] Other (See Comments)    "leg cramps"  . Pravachol [Pravastatin Sodium] Other (See Comments)    "leg cramps"  . Riomet [Metformin Hcl] Diarrhea  . Vytorin [Ezetimibe-Simvastatin] Other (See Comments)    "leg cramps"    Home Medications:Include Plavix 75 mg daily, aspirin 81 mg daily, Amaryl 4 mg daily, metformin 500 mg 2 times per day pantoprazole 40 mg daily simvastatin 40 mg daily, Percocet as needed for pain  He has not taken Plavix since discharge.   Scheduled Inpatient Medications:    . sodium chloride 75 mL/hr at 09/24/15 1520  . norepinephrine (LEVOPHED) Adult infusion 2 mcg/min (09/24/15 1742)  . sodium chloride Stopped (09/24/15 1452)    Family History:Positive for heart disease diabetes negative for colon cancer .  Social History:   reports that he quit smoking about 11 years ago. His smoking use included Cigarettes. He smoked 1.50 packs per day. He has never used smokeless tobacco. He reports that he drinks about 0.6 oz of alcohol per week. He reports that he does not use illicit drugs. The patient denies ETOH, tobacco, or drug use.   Review of Systems: He reports no recent acute illness such as cough cold or sore throat. He does have some difficulty breathing and that it hurts to take a deep breath. He reports no chest pain. He reports limited walking since discharge. He reports no ankle edema. Review of systems otherwise negative.  Physical Examination: BP 75/52 mmHg  Pulse 108  Temp(Src) 97.8 F (36.6 C)  Resp 30  Ht '5\' 7"'$  (1.702 m)  Wt 81.647 kg (180 lb)  BMI 28.19 kg/m2  SpO2 99%  GENERAL:  Awake alert and oriented and in moderate acute distress.  HEENT:  Head is normocephalic.  Pupils are equal reactive to light.  Extraocular movements are intact. Sclera is clear.  Pharynx is clear.  NECK:  Supple with no palpable mass and no adenopathy.  HEART:  Regular rhythm S1-S2, without murmur.  LUNGS:  Clear without rales rhonchi or  wheezes.  ABDOMEN: Markedly distended and tympanitic. There is mild degree of tenderness in all 4 quadrants. His midline incision and port sites appear to be healing satisfactorily.  Rectum: There was just a smear of stool found. Sphincter tone is intact. There is no palpable rectal mass.  Neurologic: Awake alert and oriented and moving all extremities  Extremities: No current deep and edema Data: Lab Results  Component Value Date   WBC 18.3* 09/24/2015   HGB 12.3* 09/24/2015   HCT 37.1* 09/24/2015   MCV 84.2 09/24/2015   PLT 489* 09/24/2015    Recent Labs Lab 09/19/15 0517 09/21/15 0511 09/24/15 0947  HGB 10.3* 10.5* 12.3*   Lab Results  Component Value Date   NA 137 09/24/2015   K 3.9 09/24/2015   CL 99* 09/24/2015   CO2 22 09/24/2015   BUN 29* 09/24/2015   CREATININE 2.15* 09/24/2015   Lab Results  Component Value Date   ALT 20  09/24/2015   AST 36 09/24/2015   ALKPHOS 55 09/24/2015   BILITOT 1.3* 09/24/2015   No results for input(s): APTT, INR, PTT in the last 168 hours.  Clinical data   I reviewed his CT images.  These images demonstrate some dilatation of the stomach.  Ingested oral contrast progressed through the normal-appearing small bowel into the right colon.  The anastomosis appeared to be widely patent.  There was marked gaseous distention throughout the colon extending down into the rectum.  A moderate amount of fecal matter was demonstrated in the distal colon.  There was a tiny air bubbles adjacent to the anastomosis likely related to the recent surgery..  There was no fluid collection   After the CT scan I returned to reevaluate the patient and he reported that he had passed some gas and abdomen was feeling better.  Subsequent examination demonstrated persistent gaseous distention and minimal degree of tenderness.  Assessment/Plan: Mr. Wien is a 82 y.o. male with acute abdominal pain related to gaseous distention of colon, possible ileus.  Recent  myocardial infarction with cardiogenic shock  Acute renal failure  Recommendations:  Admit to internal medicine service to CCU. He has had more than 2 L IV fluid.   Central line and Levophed drip.  NG suction,  NPO  .  Rochel Brome, MD

## 2015-09-24 NOTE — ED Notes (Addendum)
Pt had a colon resection on 09/14/2015, pt reports an increased amount of pain last night , pt reports shortness of breath, pt spoke with Dr.Smith who instructed pt to go to ED, pt reports having a MI post-op, cardiac stent placed 09/16/2105

## 2015-09-24 NOTE — Progress Notes (Signed)
ANTICOAGULATION CONSULT NOTE - Initial Consult  Pharmacy Consult for Lovenox  Indication: VTE prophylaxis  Allergies  Allergen Reactions  . Lipitor [Atorvastatin] Other (See Comments)    "leg cramps"  . Pravachol [Pravastatin Sodium] Other (See Comments)    "leg cramps"  . Riomet [Metformin Hcl] Diarrhea  . Vytorin [Ezetimibe-Simvastatin] Other (See Comments)    "leg cramps"    Patient Measurements: Height: '5\' 7"'$  (170.2 cm) Weight: 180 lb (81.647 kg) IBW/kg (Calculated) : 66.1 Heparin Dosing Weight:   Vital Signs: Temp: 97.8 F (36.6 C) (01/27 0925) BP: 105/56 mmHg (01/27 1755) Pulse Rate: 92 (01/27 1755)  Labs:  Recent Labs  09/24/15 0947  HGB 12.3*  HCT 37.1*  PLT 489*  CREATININE 2.15*  TROPONINI 5.05*    Estimated Creatinine Clearance: 28 mL/min (by C-G formula based on Cr of 2.15).   Medical History: Past Medical History  Diagnosis Date  . Hypertension   . Diabetes mellitus without complication ([place])   . Hyperlipidemia   . Coronary artery disease   . neuropathy   . Low testosterone   . Neuropathy ([place])   . Anemia   . B12 deficiency   . GERD (gastroesophageal reflux disease)   . Skin cancer     Basal Cell  . Adenocarcinoma ([place])   . AAA (abdominal aortic aneurysm) ([place])   . Diverticulosis     Medications:  Prescriptions prior to admission  Medication Sig Dispense Refill Last Dose  . aspirin EC 81 MG tablet Take 81 mg by mouth daily.   09/23/2015 at Unknown time  . clopidogrel (PLAVIX) 75 MG tablet Take 1 tablet (75 mg total) by mouth daily with breakfast. 30 tablet 11 09/21/2015  . glimepiride (AMARYL) 4 MG tablet Take 4 mg by mouth daily with breakfast.   09/23/2015 at Unknown time  . loratadine (CLARITIN) 10 MG tablet Take 10 mg by mouth daily.   09/23/2015 at Unknown time  . metFORMIN (GLUCOPHAGE) 500 MG tablet Take 500 mg by mouth 2 (two) times daily with a meal.    09/23/2015 at Unknown time  . metoprolol succinate (TOPROL-XL) 25 MG 24 hr  tablet Take 25 mg by mouth daily.   09/23/2015 at Unknown time  . oxyCODONE-acetaminophen (PERCOCET/ROXICET) 5-325 MG per tablet Take by mouth every 4 (four) hours as needed for severe pain.   PRN at PRN  . pantoprazole (PROTONIX) 40 MG tablet Take 40 mg by mouth daily.    09/23/2015 at Unknown time  . simvastatin (ZOCOR) 40 MG tablet Take 40 mg by mouth daily at 6 PM.    Past Week at Unknown time  . vitamin B-12 (CYANOCOBALAMIN) 250 MCG tablet Take 500 mcg by mouth daily.   09/23/2015 at Unknown time    Assessment: CrCl = 28 ml/min  Goal of Therapy:  DVT prophylaxis    Plan:  Lovenox 40 mg SQ Q24H originally ordered.  Will adjust dose to lovenox 30 mg SQ Q24H based on CrCl < 30 ml/min.   Nicholas Parker D 09/24/2015,6:50 PM

## 2015-09-24 NOTE — ED Notes (Signed)
Mody notified of B/P

## 2015-09-24 NOTE — ED Provider Notes (Signed)
Time Seen: Approximately 0930  I have reviewed the triage notes  Chief Complaint: Abdominal Pain   History of Present Illness: Nicholas Parker is a 82 y.o. male who had recent colon surgery. The patient initially had a colonoscopy with propofol and was found to require surgery and had extensive abdominal surgery with lysis of numerous adhesions. Initial laparoscopic surgery was converted to open surgery. The patient had an acute ST wave elevation myocardial infarction while here in the hospital which was identified by persistent hypotension the patient had no chest pain or shortness of breath at that time. The patient presents with postoperative abdominal pain and points mainly to the lower middle quadrant source of discomfort and feels some abdominal distention. He states he had the pain earlier this morning and took a Percocet which offered him some relief and now the pain is coming back more intense. His had some nausea with no persistent vomiting. He denies any chest pain, arm or jaw discomfort. He denies any fever at home. He states he had a bowel movement yesterday but he states it was very small and hard in nature.   Past Medical History  Diagnosis Date  . Hypertension   . Diabetes mellitus without complication ([place])   . Hyperlipidemia   . Coronary artery disease   . neuropathy   . Low testosterone   . Neuropathy ([place])   . Anemia   . B12 deficiency   . GERD (gastroesophageal reflux disease)   . Skin cancer     Basal Cell  . Adenocarcinoma ([place])   . AAA (abdominal aortic aneurysm) ([place])   . Diverticulosis     Patient Active Problem List   Diagnosis Date Noted  . Renal failure 09/24/2015  . Acute ST elevation myocardial infarction (STEMI) involving right coronary artery ([place])   . Colon polyp 09/14/2015    Past Surgical History  Procedure Laterality Date  . Arthrosopic rotator cuff repair Left   . Robotic colectomy    . Coronary angioplasty    . Vascular surgery    .  Colonoscopy with propofol N/A 04/13/2015    Procedure: COLONOSCOPY WITH PROPOFOL;  Surgeon: Lollie Sails, MD;  Location: [facility] ENDOSCOPY;  Service: Endoscopy;  Laterality: N/A;  . Colonoscopy with propofol N/A 04/14/2015    Procedure: COLONOSCOPY WITH PROPOFOL;  Surgeon: Lollie Sails, MD;  Location: [facility] ENDOSCOPY;  Service: Endoscopy;  Laterality: N/A;  . Colonoscopy with propofol N/A 07/20/2015    Procedure: COLONOSCOPY WITH PROPOFOL;  Surgeon: Lollie Sails, MD;  Location: [facility] ENDOSCOPY;  Service: Endoscopy;  Laterality: N/A;  . Colon surgery    . Eye surgery Bilateral     Cataract Extraction with IOL  . Abdominal aortic aneurysm repair  2014    Dr. Delana Meyer, [facility]  . Laparoscopic right colectomy Right 09/14/2015    Procedure: LAPAROSCOPIC RIGHT COLECTOMY  CONVERTED TO OPEN RIGHT COLECTOMY, LYSIS OF ADHESIONS;  Surgeon: Leonie Green, MD;  Location: [facility] ORS;  Service: General;  Laterality: Right;  . Cardiac catheterization N/A 09/17/2015    Procedure: Left Heart Cath and Coronary Angiography;  Surgeon: Wellington Hampshire, MD;  Location: [place] CV LAB;  Service: Cardiovascular;  Laterality: N/A;  . Cardiac catheterization N/A 09/17/2015    Procedure: Coronary Stent Intervention;  Surgeon: Wellington Hampshire, MD;  Location: [place] CV LAB;  Service: Cardiovascular;  Laterality: N/A;    Past Surgical History  Procedure Laterality Date  . Arthrosopic rotator cuff repair Left   .  Robotic colectomy    . Coronary angioplasty    . Vascular surgery    . Colonoscopy with propofol N/A 04/13/2015    Procedure: COLONOSCOPY WITH PROPOFOL;  Surgeon: Lollie Sails, MD;  Location: [facility] ENDOSCOPY;  Service: Endoscopy;  Laterality: N/A;  . Colonoscopy with propofol N/A 04/14/2015    Procedure: COLONOSCOPY WITH PROPOFOL;  Surgeon: Lollie Sails, MD;  Location: [facility] ENDOSCOPY;  Service: Endoscopy;  Laterality: N/A;  . Colonoscopy with propofol N/A 07/20/2015    Procedure:  COLONOSCOPY WITH PROPOFOL;  Surgeon: Lollie Sails, MD;  Location: [facility] ENDOSCOPY;  Service: Endoscopy;  Laterality: N/A;  . Colon surgery    . Eye surgery Bilateral     Cataract Extraction with IOL  . Abdominal aortic aneurysm repair  2014    Dr. Delana Meyer, [facility]  . Laparoscopic right colectomy Right 09/14/2015    Procedure: LAPAROSCOPIC RIGHT COLECTOMY  CONVERTED TO OPEN RIGHT COLECTOMY, LYSIS OF ADHESIONS;  Surgeon: Leonie Green, MD;  Location: [facility] ORS;  Service: General;  Laterality: Right;  . Cardiac catheterization N/A 09/17/2015    Procedure: Left Heart Cath and Coronary Angiography;  Surgeon: Wellington Hampshire, MD;  Location: [place] CV LAB;  Service: Cardiovascular;  Laterality: N/A;  . Cardiac catheterization N/A 09/17/2015    Procedure: Coronary Stent Intervention;  Surgeon: Wellington Hampshire, MD;  Location: [place] CV LAB;  Service: Cardiovascular;  Laterality: N/A;    Current Outpatient Rx  Name  Route  Sig  Dispense  Refill  . aspirin EC 81 MG tablet   Oral   Take 81 mg by mouth daily.         . clopidogrel (PLAVIX) 75 MG tablet   Oral   Take 1 tablet (75 mg total) by mouth daily with breakfast.   30 tablet   11   . glimepiride (AMARYL) 4 MG tablet   Oral   Take 4 mg by mouth daily with breakfast.         . loratadine (CLARITIN) 10 MG tablet   Oral   Take 10 mg by mouth daily.         . metFORMIN (GLUCOPHAGE) 500 MG tablet   Oral   Take 500 mg by mouth 2 (two) times daily with a meal.          . metoprolol succinate (TOPROL-XL) 25 MG 24 hr tablet   Oral   Take 25 mg by mouth daily.         Marland Kitchen oxyCODONE-acetaminophen (PERCOCET/ROXICET) 5-325 MG per tablet   Oral   Take by mouth every 4 (four) hours as needed for severe pain.         . pantoprazole (PROTONIX) 40 MG tablet   Oral   Take 40 mg by mouth daily.          . simvastatin (ZOCOR) 40 MG tablet   Oral   Take 40 mg by mouth daily at 6 PM.          . vitamin B-12  (CYANOCOBALAMIN) 250 MCG tablet   Oral   Take 500 mcg by mouth daily.           Allergies:  Lipitor; Pravachol; Riomet; and Vytorin  Family History: No family history on file.  Social History: Social History  Substance Use Topics  . Smoking status: Former Smoker -- 1.50 packs/day    Types: Cigarettes    Quit date: 10/03/2003  . Smokeless tobacco: Never Used  . Alcohol Use: 0.6  oz/week    1 Cans of beer per week     Review of Systems:   10 point review of systems was performed and was otherwise negative:  Constitutional: No fever Eyes: No visual disturbances ENT: No sore throat, ear pain Cardiac: No chest pain Respiratory: No shortness of breath, wheezing, or stridor Abdomen: Pain is mostly lower middle abdominal region, nausea/no vomiting, No diarrhea Endocrine: No weight loss, No night sweats Extremities: No peripheral edema, cyanosis Skin: No rashes, easy bruising Neurologic: No focal weakness, trouble with speech or swollowing Urologic: No dysuria, Hematuria, or urinary frequency   Physical Exam:  ED Triage Vitals  Enc Vitals Group     BP 09/24/15 0925 120/67 mmHg     Pulse Rate 09/24/15 0925 84     Resp 09/24/15 0925 20     Temp 09/24/15 0925 97.8 F (36.6 C)     Temp src --      SpO2 09/24/15 0939 98 %     Weight 09/24/15 0925 180 lb (81.647 kg)     Height 09/24/15 0925 '5\' 7"'$  (1.702 m)     Head Cir --      Peak Flow --      Pain Score 09/24/15 1311 4     Pain Loc --      Pain Edu? --      Excl. in [place]? --     General: Awake , Alert , and Oriented times 3; GCS 15 patient appears to be very anxious and uncomfortable usually diaphoretic Head: Normal cephalic , atraumatic Eyes: Pupils equal , round, reactive to light Nose/Throat: No nasal drainage, patent upper airway without erythema or exudate.  Neck: Supple, Full range of motion, No anterior adenopathy or palpable thyroid masses Lungs: Clear to ascultation without wheezes , rhonchi, or  rales Heart: Tachycardia with him out murmurs, gallops, rubs Abdomen: Diffusely tender with a well aligned well healing midline vertical surgical wound without rebound, guarding , or rigidity; diminished bowel sounds in all 4 quadrants. No organomegaly .        Extremities: 2 plus symmetric pulses. No edema, clubbing or cyanosis Neurologic: normal ambulation, Motor symmetric without deficits, sensory intact Skin: warm, dry, no rashes   Labs:   All laboratory work was reviewed including any pertinent negatives or positives listed below:  Labs Reviewed  CBC WITH DIFFERENTIAL/PLATELET - Abnormal; Notable for the following:    WBC 18.3 (*)    Hemoglobin 12.3 (*)    HCT 37.1 (*)    RDW 15.7 (*)    Platelets 489 (*)    Neutro Abs 16.3 (*)    All other components within normal limits  COMPREHENSIVE METABOLIC PANEL - Abnormal; Notable for the following:    Chloride 99 (*)    Glucose, Bld 166 (*)    BUN 29 (*)    Creatinine, Ser 2.15 (*)    Total Bilirubin 1.3 (*)    GFR calc non Af Amer 27 (*)    GFR calc Af Amer 32 (*)    Anion gap 16 (*)    All other components within normal limits  TROPONIN I - Abnormal; Notable for the following:    Troponin I 5.05 (*)    All other components within normal limits  LIPASE, BLOOD  URINALYSIS COMPLETEWITH MICROSCOPIC ([facility] ONLY)   patient has some renal insufficiency and an elevated white blood cell count. Troponin is elevated though decreasing from his hospital troponins  EKG:  ED ECG REPORT I, Aaron Edelman  Theodosia Paling, the attending physician, personally viewed and interpreted this ECG.  Date: 09/24/2015 EKG Time: 1013 Rate: 111 Rhythm: Atrial fibrillation QRS Axis: normal Intervals: normal ST/T Wave abnormalities: Diffuse ST changes with elevation noted significantly in leads 3 and aVF Conduction Disturbances: none Narrative Interpretation: unremarkable No significant change from EKG performed on 09/18/2015   Radiology:      CT Abdomen  Pelvis Wo Contrast (Edited Result - FINAL) Result time: 09/24/15 13:15:51   Addendum 1 of 1 by Rad Results In Interface (09/24/15 13:15:51)   ADDENDUM REPORT: 09/24/2015 13:15 ADDENDUM: It should be noted the kidneys are well visualized and demonstrate no definitive obstructive change. No renal calculi are seen although heavy renal vascular calcification is noted. Electronically Signed  By: Inez Catalina M.D.  On: 09/24/2015 13:15      Final result by Rad Results In Interface (09/24/15 12:29:15)   Narrative:   CLINICAL DATA: Postop abdominal pain, history of recent colonic surgery  EXAM: CT ABDOMEN AND PELVIS WITHOUT CONTRAST  TECHNIQUE: Multidetector CT imaging of the abdomen and pelvis was performed following the standard protocol without IV contrast.  COMPARISON: 04/26/2015.  FINDINGS: Lung bases are free of acute infiltrate or sizable effusion.  The liver, spleen, adrenal glands and pancreas are all normal in their CT appearance. The gallbladder is well distended and demonstrates multiple dependent gallstones. No definitive pericholecystic fluid is seen. Small amounts of free fluid are noted surrounding the liver and spleen. These changes are likely related to the recent colonic resection.  Postsurgical changes are noted in the right colon consistent with the patient's given clinical history. No obstructive changes are seen. A tiny focus of air is noted adjacent to the suture line again likely related to the recent surgery. The anastomosis appears widely patent. Changes of prior sigmoid colectomy are also noted. No significant obstructive changes are noted.  Aortic stent graft is noted in satisfactory position. Comparison with prior exam shows a stable appearing aneurysm sac. Iliac calcifications are seen without aneurysmal dilatation.  The bladder is well distended. Prostate calcifications are seen. No significant lymphadenopathy is noted. The osseous  structures show degenerative change of the lumbar spine.  IMPRESSION: Small amounts of free fluid and free air likely related to the recent colonic surgery. No significant findings to suggest acute perforation are noted at this time.  Multiple gallstones without definitive complicating factors.   Electronically Signed By: Inez Catalina M.D. On: 09/24/2015 12:29          DG Chest Port 1 View (Final result) Result time: 09/24/15 10:25:33   Final result by Rad Results In Interface (09/24/15 10:25:33)   Narrative:   CLINICAL DATA: Recent colon resection, epigastric pain, shortness of breath, last bowel movement yesterday, hypertension, diabetes mellitus, coronary artery disease post angioplasty, hyperlipidemia, GERD  EXAM: PORTABLE CHEST 1 VIEW  COMPARISON: Portable exam 1000 hours compared to 04/14/2014  FINDINGS: Enlargement of cardiac silhouette.  Atherosclerotic calcification aorta.  Mediastinal contours and pulmonary vascularity normal for technique.  Lungs clear.  No pleural effusion or pneumothorax.  Bones demineralized.  IMPRESSION: Minimal enlargement of cardiac silhouette.  No acute abnormalities.   Electronically Signed By: Lavonia Dana M.D. On: 09/24/2015 10:25        I personally reviewed the radiologic studies     ED Course:  The patient arrives with significant abdominal discomfort and history of recent surgery and recent myocardial infarction. Patient was placed on a continuous cardiac monitor with no signs of other arrhythmias which persistent A. fib  with a occasionally rapid ventricular rate. Pulse ox shows some mild hypoxia and the patient was placed on supplemental oxygen therapy. He currently denies any chest pain or shortness of breath though he states a pleuritic component to his pain is located any abdominal region. His differential included bowel obstruction, bowel perforation, ischemic bowel disease with his history  of atrial fibrillation, etc. Given his initial EKG findings we were not sure whether or not this ST elevation was acute. The patient was seen and evaluated by on-call for the heart catheterization team and the ST-T waves apparently never returned back to normal after his recent myocardial infarction and his troponin was otherwise much more significantly elevated while here in the hospital. Patient was felt not to be having an acute ST wave elevation myocardial infarction at this time and would be observed probably with serial troponins. Patient briefly had some nitroglycerin paste applied. Patient did develop some mild hypotension here was given IV fluid resuscitation. Patient had surgical consultation with his surgeon Dr. Tamala Julian who was seen and evaluated the patient and felt he may have about obstruction wait and see what the abdominal CT showed. Patient does not have any indications of about structures and no signs of a bowel ileus in the large intestine. Patient then pass gas and felt significantly symptomatically improved. He still has some mild persistent hypotension at this time and nitroglycerin paste was removed. He does have some renal insufficiency site on his laboratory work.   Assessment: * Acute postoperative abdominal pain Large bowel ileus Recent myocardial infarction with cardiogenic shock Renal insufficiency   Final Clinical Impression:   Final diagnoses:  Renal insufficiency     Plan:  Inpatient management            Daymon Larsen, MD 09/24/15 1426

## 2015-09-24 NOTE — ED Provider Notes (Signed)
-----------------------------------------   6:10 PM on 09/24/2015 -----------------------------------------  I was requested to place a central line in this patient due to his ongoing hypotension. I evaluated the patient at approximately 3:30. I spoke with Dr. Genia Harold, internal medicine. I also spoke with Dr. Rochel Brome, the patient's general surgeon who recently performed abdominal surgery on him.  With the patient's persistent hypotension, with systolic pressures in the 70s and 80s, I discussed a central line with the patient and his wife. We discussed risks and benefits. The patient consented to the procedure.  Having spoken with Dr. Tamala Julian, he recommended that I place a nasogastric tube down before the central line. This was on done.  Following this, I place the central line without event at approximately 5:35 PM.   CENTRAL LINE Performed by: Ahmed Prima Consent: The procedure was performed in an emergent situation. Required items: required blood products, implants, devices, and special equipment available Patient identity confirmed: arm band and provided demographic data Time out: Immediately prior to procedure a "time out" was called to verify the correct patient, procedure, equipment, support staff and site/side marked as required. Indications: vascular access Anesthesia: local infiltration Local anesthetic: lidocaine 1% with epinephrine Anesthetic total: 2.5 ml Patient sedated: no Preparation: skin prepped with 2% chlorhexidine Skin prep agent dried: skin prep agent completely dried prior to procedure Sterile barriers: all five maximum sterile barriers used - cap, mask, sterile gown, sterile gloves, and large sterile sheet Hand hygiene: hand hygiene performed prior to central venous catheter insertion  Location details: Right internal jugular   Catheter type: triple lumen Catheter size: 8 Fr Pre-procedure: landmarks identified Ultrasound guidance: Yes, with easy  visualization of the internal jugular and the carotid artery  Successful placement: yes Post-procedure: line sutured and dressing applied Assessment: blood return through all parts, free fluid flow, placement verified by x-ray and no pneumothorax on x-ray Patient tolerance: Patient tolerated the procedure well with no immediate complications.   Chest x-ray status post suture line placement: IMPRESSION: Right central line tip in the SVC. No pneumothorax. NG tube enters the stomach.  Mild vascular congestion and bibasilar atelectasis.    Ahmed Prima, MD 09/24/15 [phone]

## 2015-09-24 NOTE — Progress Notes (Signed)
I was called for a possible inferior ST elevation myocardial infarction. The patient is well-known to me from recent admission. He had abdominal surgery done by Dr. Tamala Julian. He had postoperative inferior ST elevation myocardial infarction and required drug-eluting stent placement to the right coronary artery. His EKG never went back to normal after a cardiac infarction and echo showed severe inferior and inferolateral hypokinesis. The patient did not take Plavix after hospital discharge according to the wife as she did not get the medications from pharmacy. Since his discharge, he has been having severe abdominal pain at the surgical site with nausea. The patient is noted to be in atrial fibrillation with rapid ventricular response, he is in moderate distress and his abdomen is very tender.  I do not think this patient is having another inferior ST elevation myocardial infarction but we have to follow this clinically. His EKG is definitely abnormal but it did not go back to normal after his recent stenting. Troponin is 5 which could be downtrend from his recently significantly elevated troponin. My worry is that he has some abdominal surgical complications. I agree with CT scan of the abdomen and surgical consult before making any decision about the need for urgent cardiac catheterization.  The patient's cardiologist is Dr. Nehemiah Massed and I talked with him personally and updated him.

## 2015-09-24 NOTE — H&P (Signed)
[place] [facility] [place] NAME: Nicholas Parker    MR#:  [id number]  DATE OF BIRTH:  24-Sep-1933  DATE OF ADMISSION:  09/24/2015  PRIMARY CARE PHYSICIAN: Tama High III, MD   REQUESTING/REFERRING PHYSICIAN:  09/23/2025  CHIEF COMPLAINT:  Abdominal pain  HISTORY OF PRESENT ILLNESS:  Nicholas Parker  is a 82 y.o. male with a known history of recent colon resection and postoperative inferior ST elevation MI with drug-eluting stent placement to the right coronary artery who presents with above complaint. Patient is reporting increased amount of abdominal pain since last night. In the emergency room CT scan was performed which showed no evidence of bowel obstruction. Patient has been seen and evaluated by both cardiology and surgery. Patient's abdominal pain actually has subsided after he passed gas. Cardiology saw the patient as there was question of possible inferior MI. Dr. Fletcher Anon does not think that there is a new inferior MI. His troponins are actually trending down from last hospitalization.  Patient reports he did not take Plavix for the past 2 days due to abdominal pain.  PAST MEDICAL HISTORY:   Past Medical History  Diagnosis Date  . Hypertension   . Diabetes mellitus without complication ([place])   . Hyperlipidemia   . Coronary artery disease   . neuropathy   . Low testosterone   . Neuropathy ([place])   . Anemia   . B12 deficiency   . GERD (gastroesophageal reflux disease)   . Skin cancer     Basal Cell  . Adenocarcinoma ([place])   . AAA (abdominal aortic aneurysm) ([place])   . Diverticulosis     PAST SURGICAL HISTORY:   Past Surgical History  Procedure Laterality Date  . Arthrosopic rotator cuff repair Left   . Robotic colectomy    . Coronary angioplasty    . Vascular surgery    . Colonoscopy with propofol N/A 04/13/2015    Procedure: COLONOSCOPY WITH PROPOFOL;  Surgeon: Lollie Sails, MD;  Location: [facility] ENDOSCOPY;  Service:  Endoscopy;  Laterality: N/A;  . Colonoscopy with propofol N/A 04/14/2015    Procedure: COLONOSCOPY WITH PROPOFOL;  Surgeon: Lollie Sails, MD;  Location: [facility] ENDOSCOPY;  Service: Endoscopy;  Laterality: N/A;  . Colonoscopy with propofol N/A 07/20/2015    Procedure: COLONOSCOPY WITH PROPOFOL;  Surgeon: Lollie Sails, MD;  Location: [facility] ENDOSCOPY;  Service: Endoscopy;  Laterality: N/A;  . Colon surgery    . Eye surgery Bilateral     Cataract Extraction with IOL  . Abdominal aortic aneurysm repair  2014    Dr. Delana Meyer, [facility]  . Laparoscopic right colectomy Right 09/14/2015    Procedure: LAPAROSCOPIC RIGHT COLECTOMY  CONVERTED TO OPEN RIGHT COLECTOMY, LYSIS OF ADHESIONS;  Surgeon: Leonie Green, MD;  Location: [facility] ORS;  Service: General;  Laterality: Right;  . Cardiac catheterization N/A 09/17/2015    Procedure: Left Heart Cath and Coronary Angiography;  Surgeon: Wellington Hampshire, MD;  Location: [place] CV LAB;  Service: Cardiovascular;  Laterality: N/A;  . Cardiac catheterization N/A 09/17/2015    Procedure: Coronary Stent Intervention;  Surgeon: Wellington Hampshire, MD;  Location: [place] CV LAB;  Service: Cardiovascular;  Laterality: N/A;    SOCIAL HISTORY:   Social History  Substance Use Topics  . Smoking status: Former Smoker -- 1.50 packs/day    Types: Cigarettes    Quit date: 10/03/2003  . Smokeless tobacco: Never Used  . Alcohol Use: 0.6 oz/week  1 Cans of beer per week    FAMILY HISTORY:  Both parents are deceased. Mother with coronary artery disease and diabetes and father with lung cancer  DRUG ALLERGIES:   Allergies  Allergen Reactions  . Lipitor [Atorvastatin] Other (See Comments)    "leg cramps"  . Pravachol [Pravastatin Sodium] Other (See Comments)    "leg cramps"  . Riomet [Metformin Hcl] Diarrhea  . Vytorin [Ezetimibe-Simvastatin] Other (See Comments)    "leg cramps"     REVIEW OF SYSTEMS:  CONSTITUTIONAL: No fever, fatigue or  weakness.  EYES: No blurred or double vision.  EARS, NOSE, AND THROAT: No tinnitus or ear pain.  RESPIRATORY: No cough, shortness of breath, wheezing or hemoptysis.  CARDIOVASCULAR: No chest pain, orthopnea, edema.  GASTROINTESTINAL: No nausea, vomiting, diarrhea ..abdominal pain has now resolved GENITOURINARY: No dysuria, hematuria.  ENDOCRINE: No polyuria, nocturia,  HEMATOLOGY: No anemia, easy bruising or bleeding SKIN: No rash or lesion. MUSCULOSKELETAL: No joint pain or arthritis.   NEUROLOGIC: No tingling, numbness, weakness.  PSYCHIATRY: No anxiety or depression.   MEDICATIONS AT HOME:   Prior to Admission medications   Medication Sig Start Date End Date Taking? Authorizing Provider  aspirin EC 81 MG tablet Take 81 mg by mouth daily.   Yes Historical Provider, MD  clopidogrel (PLAVIX) 75 MG tablet Take 1 tablet (75 mg total) by mouth daily with breakfast. 09/21/15  Yes Leonie Green, MD  glimepiride (AMARYL) 4 MG tablet Take 4 mg by mouth daily with breakfast.   Yes Historical Provider, MD  loratadine (CLARITIN) 10 MG tablet Take 10 mg by mouth daily.   Yes Historical Provider, MD  metFORMIN (GLUCOPHAGE) 500 MG tablet Take 500 mg by mouth 2 (two) times daily with a meal.    Yes Historical Provider, MD  metoprolol succinate (TOPROL-XL) 25 MG 24 hr tablet Take 25 mg by mouth daily.   Yes Historical Provider, MD  oxyCODONE-acetaminophen (PERCOCET/ROXICET) 5-325 MG per tablet Take by mouth every 4 (four) hours as needed for severe pain.   Yes Historical Provider, MD  pantoprazole (PROTONIX) 40 MG tablet Take 40 mg by mouth daily.    Yes Historical Provider, MD  simvastatin (ZOCOR) 40 MG tablet Take 40 mg by mouth daily at 6 PM.    Yes Historical Provider, MD  vitamin B-12 (CYANOCOBALAMIN) 250 MCG tablet Take 500 mcg by mouth daily.   Yes Historical Provider, MD      VITAL SIGNS:  Blood pressure 90/53, pulse 110, temperature 97.8 F (36.6 C), resp. rate 37, height '5\' 7"'$  (1.702  m), weight 81.647 kg (180 lb), SpO2 97 %.  PHYSICAL EXAMINATION:  GENERAL:  82 y.o.-year-old patient lying in the bed with no acute distress.  EYES: Pupils equal, round, reactive to light and accommodation. No scleral icterus. Extraocular muscles intact.  HEENT: Head atraumatic, normocephalic. Oropharynx and nasopharynx clear.  NECK:  Supple, no jugular venous distention. No thyroid enlargement, no tenderness.  LUNGS: Normal breath sounds bilaterally, no wheezing, rales,rhonchi or crepitation. No use of accessory muscles of respiration.  CARDIOVASCULAR: S1, S2 normal. No murmurs, rubs, or gallops.  ABDOMEN: Soft, nontender, slight distention Bowel sounds present. No organomegaly or mass. Surgical staples are clean and intact. EXTREMITIES: No pedal edema, cyanosis, or clubbing.  NEUROLOGIC: Cranial nerves II through XII are grossly intact. No focal deficits. PSYCHIATRIC: The patient is alert and oriented x 3.  SKIN: No obvious rash, lesion, or ulcer.   LABORATORY PANEL:   CBC  Recent Labs Lab 09/24/15  0947  WBC 18.3*  HGB 12.3*  HCT 37.1*  PLT 489*   ------------------------------------------------------------------------------------------------------------------  Chemistries   Recent Labs Lab 09/17/15 1446  09/24/15 0947  NA  --   < > 137  K  --   < > 3.9  CL  --   < > 99*  CO2  --   < > 22  GLUCOSE  --   < > 166*  BUN  --   < > 29*  CREATININE 2.28*  < > 2.15*  CALCIUM  --   < > 9.0  MG 2.0  --   --   AST  --   --  36  ALT  --   --  20  ALKPHOS  --   --  55  BILITOT  --   --  1.3*  < > = values in this interval not displayed. ------------------------------------------------------------------------------------------------------------------  Cardiac Enzymes  Recent Labs Lab 09/18/15 0440 09/18/15 0942 09/24/15 0947  TROPONINI 42.30* 33.33* 5.05*    ------------------------------------------------------------------------------------------------------------------  RADIOLOGY:  Ct Abdomen Pelvis Wo Contrast  09/24/2015  CLINICAL DATA:  Postop abdominal pain, history of recent colonic surgery EXAM: CT ABDOMEN AND PELVIS WITHOUT CONTRAST TECHNIQUE: Multidetector CT imaging of the abdomen and pelvis was performed following the standard protocol without IV contrast. COMPARISON:  04/26/2015. FINDINGS: Lung bases are free of acute infiltrate or sizable effusion. The liver, spleen, adrenal glands and pancreas are all normal in their CT appearance. The gallbladder is well distended and demonstrates multiple dependent gallstones. No definitive pericholecystic fluid is seen. Small amounts of free fluid are noted surrounding the liver and spleen. These changes are likely related to the recent colonic resection. Postsurgical changes are noted in the right colon consistent with the patient's given clinical history. No obstructive changes are seen. A tiny focus of air is noted adjacent to the suture line again likely related to the recent surgery. The anastomosis appears widely patent. Changes of prior sigmoid colectomy are also noted. No significant obstructive changes are noted. Aortic stent graft is noted in satisfactory position. Comparison with prior exam shows a stable appearing aneurysm sac. Iliac calcifications are seen without aneurysmal dilatation. The bladder is well distended. Prostate calcifications are seen. No significant lymphadenopathy is noted. The osseous structures show degenerative change of the lumbar spine. IMPRESSION: Small amounts of free fluid and free air likely related to the recent colonic surgery. No significant findings to suggest acute perforation are noted at this time. Multiple gallstones without definitive complicating factors. Electronically Signed   By: Inez Catalina M.D.   On: 09/24/2015 12:29   Dg Chest Port 1 View  09/24/2015   CLINICAL DATA:  Recent colon resection, epigastric pain, shortness of breath, last bowel movement yesterday, hypertension, diabetes mellitus, coronary artery disease post angioplasty, hyperlipidemia, GERD EXAM: PORTABLE CHEST 1 VIEW COMPARISON:  Portable exam 1000 hours compared to 04/14/2014 FINDINGS: Enlargement of cardiac silhouette. Atherosclerotic calcification aorta. Mediastinal contours and pulmonary vascularity normal for technique. Lungs clear. No pleural effusion or pneumothorax. Bones demineralized. IMPRESSION: Minimal enlargement of cardiac silhouette. No acute abnormalities. Electronically Signed   By: Lavonia Dana M.D.   On: 09/24/2015 10:25    EKG:   EKG shows inferior infarct and a right bundle branch block  IMPRESSION AND PLAN:    82 year old male status post colon resection and postoperative inferior ST elevation MI who presents with abdominal pain which is now subsided but now has acute renal failure.  1. Acute renal failure: Patient's creatinine at  discharge was 1.03. Acute renal failure is most likely due to prerenal azotemia. Nephrology consultation has been placed. I will start IV fluids and monitor creatinine. Patient does have an ejection fraction of 45-50%. Hold nephrotoxic agents.  2. Abdominal pain: She underwent CT scan noncontrast which shows no acute pathology. Patient's abdominal pain had subsided after he passed gas. Dr. Tamala Julian will continue to consult on patient and make recommendations.    3. Recent ST elevation MI: Patient has a drug-eluting stent in RCA. He has been off of Plavix for at least 2 days. Continue aspirin and Plavix. Hold metoprolol due to low blood pressure. Continue statin. Dr. Fletcher Anon will continue to follow. Continue to trend troponins to assure that they are decreasing.  4. Hypotension: This is likely due to nitroglycerin that was given in emergency room due to the EKG and elevated troponin. Nitroglycerin has been now discontinued. He has responded  to gentle boluses. Blood pressure needs to be monitored. Hold metoprolol for now.  5. Type 2 diabetes: Hold by mouth medications. Continue sliding scale insulin and ADA diet.   All the records are reviewed and case discussed with ED provider. Management plans discussed with the patient and familyand he is in agreement.  CODE STATUS: FULL  TOTAL TIME TAKING CARE OF THIS PATIENT: 50 minutes.    Arkin Imran M.D on 09/24/2015 at 1:10 PM  Between 7am to 6pm - Pager - [phone] After 6pm go to [email or web address] - password [email or web address] [facility]  [place] [facility]  Office  [phone]  CC: Primary care physician; Adin Hector, MD

## 2015-09-25 ENCOUNTER — Inpatient Hospital Stay: Payer: Commercial Managed Care - HMO

## 2015-09-25 LAB — CBC
HEMATOCRIT: 30.6 % — AB (ref 40.0–52.0)
Hemoglobin: 10 g/dL — ABNORMAL LOW (ref 13.0–18.0)
MCH: 27.5 pg (ref 26.0–34.0)
MCHC: 32.6 g/dL (ref 32.0–36.0)
MCV: 84.1 fL (ref 80.0–100.0)
PLATELETS: 311 10*3/uL (ref 150–440)
RBC: 3.64 MIL/uL — AB (ref 4.40–5.90)
RDW: 16.1 % — AB (ref 11.5–14.5)
WBC: 15.4 10*3/uL — AB (ref 3.8–10.6)

## 2015-09-25 LAB — BASIC METABOLIC PANEL
Anion gap: 10 (ref 5–15)
BUN: 34 mg/dL — ABNORMAL HIGH (ref 6–20)
CHLORIDE: 105 mmol/L (ref 101–111)
CO2: 21 mmol/L — ABNORMAL LOW (ref 22–32)
CREATININE: 1.69 mg/dL — AB (ref 0.61–1.24)
Calcium: 7.9 mg/dL — ABNORMAL LOW (ref 8.9–10.3)
GFR, EST AFRICAN AMERICAN: 42 mL/min — AB (ref >60)
GFR, EST NON AFRICAN AMERICAN: 37 mL/min — AB (ref >60)
Glucose, Bld: 124 mg/dL — ABNORMAL HIGH (ref 65–99)
POTASSIUM: 4.4 mmol/L (ref 3.5–5.1)
SODIUM: 136 mmol/L (ref 135–145)

## 2015-09-25 LAB — URINALYSIS COMPLETE WITH MICROSCOPIC (ARMC ONLY)
BACTERIA UA: NONE SEEN
BILIRUBIN URINE: NEGATIVE
GLUCOSE, UA: NEGATIVE mg/dL
Leukocytes, UA: NEGATIVE
NITRITE: NEGATIVE
PH: 5 (ref 5.0–8.0)
Protein, ur: 30 mg/dL — AB
Specific Gravity, Urine: 1.019 (ref 1.005–1.030)

## 2015-09-25 LAB — TROPONIN I
TROPONIN I: 6.77 ng/mL — AB (ref <0.031)
Troponin I: 6.59 ng/mL — ABNORMAL HIGH (ref <0.031)

## 2015-09-25 LAB — GLUCOSE, CAPILLARY
GLUCOSE-CAPILLARY: 91 mg/dL (ref 65–99)
GLUCOSE-CAPILLARY: 101 mg/dL — AB (ref 65–99)
Glucose-Capillary: 107 mg/dL — ABNORMAL HIGH (ref 65–99)
Glucose-Capillary: 121 mg/dL — ABNORMAL HIGH (ref 65–99)

## 2015-09-25 MED ORDER — ENOXAPARIN SODIUM 40 MG/0.4ML ~~LOC~~ SOLN: 40.0000 mg | SUBCUTANEOUS | Status: DC

## 2015-09-25 MED ORDER — PHENOL 1.4 % MT LIQD
1.0000 | OROMUCOSAL | Status: DC | PRN
  Filled 2015-09-25 (×2): qty 177

## 2015-09-25 NOTE — Progress Notes (Signed)
Initial Nutrition Assessment    INTERVENTION:   Meals and Snacks: await diet progression  NUTRITION DIAGNOSIS:   Inadequate oral intake related to acute illness as evidenced by NPO status.  GOAL:   Patient will meet greater than or equal to 90% of their needs  MONITOR:    (Energy Intake, Anthropometrics, Digestive System, Electrolyte/Renal Profile)  REASON FOR ASSESSMENT:   Diagnosis    ASSESSMENT:    Pt admitted with abdominal pain, ARF with possible dehydration, hypotension currently on levophed; pt with recent colon resection and postop inferior STEMI with stent to RCA. Unable to speak with pt on rounding today  Past Medical History  Diagnosis Date  . Hypertension   . Diabetes mellitus without complication ([place])   . Hyperlipidemia   . Coronary artery disease   . neuropathy   . Low testosterone   . Neuropathy ([place])   . Anemia   . B12 deficiency   . GERD (gastroesophageal reflux disease)   . Skin cancer     Basal Cell  . Adenocarcinoma ([place])   . AAA (abdominal aortic aneurysm) ([place])   . Diverticulosis    Past Surgical History  Procedure Laterality Date  . Arthrosopic rotator cuff repair Left   . Robotic colectomy    . Coronary angioplasty    . Vascular surgery    . Colonoscopy with propofol N/A 04/13/2015    Procedure: COLONOSCOPY WITH PROPOFOL;  Surgeon: Lollie Sails, MD;  Location: [facility] ENDOSCOPY;  Service: Endoscopy;  Laterality: N/A;  . Colonoscopy with propofol N/A 04/14/2015    Procedure: COLONOSCOPY WITH PROPOFOL;  Surgeon: Lollie Sails, MD;  Location: [facility] ENDOSCOPY;  Service: Endoscopy;  Laterality: N/A;  . Colonoscopy with propofol N/A 07/20/2015    Procedure: COLONOSCOPY WITH PROPOFOL;  Surgeon: Lollie Sails, MD;  Location: [facility] ENDOSCOPY;  Service: Endoscopy;  Laterality: N/A;  . Colon surgery    . Eye surgery Bilateral     Cataract Extraction with IOL  . Abdominal aortic aneurysm repair  2014    Dr. Delana Meyer, [facility]  .  Laparoscopic right colectomy Right 09/14/2015    Procedure: LAPAROSCOPIC RIGHT COLECTOMY  CONVERTED TO OPEN RIGHT COLECTOMY, LYSIS OF ADHESIONS;  Surgeon: Leonie Green, MD;  Location: [facility] ORS;  Service: General;  Laterality: Right;  . Cardiac catheterization N/A 09/17/2015    Procedure: Left Heart Cath and Coronary Angiography;  Surgeon: Wellington Hampshire, MD;  Location: [place] CV LAB;  Service: Cardiovascular;  Laterality: N/A;  . Cardiac catheterization N/A 09/17/2015    Procedure: Coronary Stent Intervention;  Surgeon: Wellington Hampshire, MD;  Location: [place] CV LAB;  Service: Cardiovascular;  Laterality: N/A;     Diet Order:  Diet NPO time specified  Digestive System: NG with minimal output and plan for removal, abdomen distended but improved, +flatus, no BM, abdominal pain improving, colonic distention present per MD note  Electrolyte and Renal Profile:  Recent Labs Lab 09/20/15 0800 09/24/15 0947 09/25/15 0224  BUN 20 29* 34*  CREATININE 1.03 2.15* 1.69*  NA 137 137 136  K 4.5 3.9 4.4  PHOS 3.5  --   --    Glucose Profile:  Recent Labs  09/24/15 1836 09/24/15 2219 09/25/15 0748  GLUCAP 96 107* 107*   Protein Profile:  Recent Labs Lab 09/20/15 0800 09/24/15 0947  ALBUMIN 3.0* 3.6   Nutritional Anemia Profile:  CBC Latest Ref Rng 09/25/2015 09/24/2015 09/21/2015  WBC 3.8 - 10.6 K/uL 15.4(H) 18.3(H) 7.0  Hemoglobin 13.0 -  18.0 g/dL 10.0(L) 12.3(L) 10.5(L)  Hematocrit 40.0 - 52.0 % 30.6(L) 37.1(L) 31.8(L)  Platelets 150 - 440 K/uL 311 489(H) 205    Meds: NS at 30 ml/hr, ss novolog, levophed  Height:   Ht Readings from Last 1 Encounters:  09/24/15 '5\' 6"'$  (1.676 m)    Weight: weight appears relatively stable after reviewing wt encounters  Wt Readings from Last 1 Encounters:  09/24/15 179 lb 0.2 oz (81.2 kg)   Wt Readings from Last 10 Encounters:  09/24/15 179 lb 0.2 oz (81.2 kg)  09/18/15 187 lb (84.823 kg)  09/06/15 180 lb (81.647 kg)   07/20/15 180 lb (81.647 kg)  04/14/15 178 lb (80.74 kg)  04/13/15 178 lb (80.74 kg)    BMI:  Body mass index is 28.91 kg/(m^2).  LOW Care Level  Kerman Passey MS, [place], LDN [phone] Pager  [phone] Weekend/On-Call Pager

## 2015-09-25 NOTE — Progress Notes (Addendum)
AVSS. On pressor support overnight. Reports passing large amount of flatus and urine. Abdominal pain much less severe than 1/26-1/27. Mainly, right side. NG: Scant drainage since placement, will remove. Lungs: Decreased BS at bases. ABD: Modest distension, mild RLQ tenderness. No referred pain. Wound: Clean. Labs: HGB back to post op/ stent placement. Creat trending down. Plan: Check KUB, Source for pain is unclear based on review of CT.  Plain films reviewed. Colonic distension noted. No gastric distension. Residual contrast from yesterday's CT does not demonstrate any dilated small bowel.

## 2015-09-25 NOTE — Progress Notes (Addendum)
Spoke with Dr. Oletta Darter, pt BP was a little soft with a MAP of 64. Dr. Oletta Darter ordered to monitor CVP. I called back the results to Dr. Jimmy Footman, CVP 14-15. She ordered to decrease fluid infusion rate, and give a one time Plavix dose, since the pt had not taken this medication in 2 days (per pt and wife). Plavix will be scheduled daily starting in the morning. Told Dr. Jimmy Footman about increased Troponin level, no further orders given. Cardiology note suggests elevated level is residual from recent STEMI. Will continue to monitor.

## 2015-09-25 NOTE — Progress Notes (Signed)
[facility]                                                                                                                                                                                            Patient Demographics   Nicholas Parker, is a 82 y.o. male, DOB - Nov 06, 1933, MEQ:[id number]  Admit date - 09/24/2015   Admitting Physician Bettey Costa, MD  Outpatient Primary MD for the patient is Bolivar, MD   LOS - 1  Subjective: Patient recently underwent colon resection and had a postop inferior ST EMI. We'll underwent a stent to the RCA. He was discharged to home and presented back with severe abdominal pain. Patient had a CT scan which showed no significant abnormalities. He was noted to have ileus. Overnight patient had placed on Levophed. His abdominal pain is improved denies any chest pain. Laser abdominal distention    Review of Systems:   CONSTITUTIONAL: No documented fever. No fatigue, weakness. No weight gain, no weight loss.  EYES: No blurry or double vision.  ENT: No tinnitus. No postnasal drip. No redness of the oropharynx.  RESPIRATORY: No cough, no wheeze, no hemoptysis. No dyspnea.  CARDIOVASCULAR: No chest pain. No orthopnea. No palpitations. No syncope.  GASTROINTESTINAL: No nausea, no vomiting or diarrhea. Positive abdominal pain. No melena or hematochezia.  GENITOURINARY: No dysuria or hematuria.  ENDOCRINE: No polyuria or nocturia. No heat or cold intolerance.  HEMATOLOGY: No anemia. No bruising. No bleeding.  INTEGUMENTARY: No rashes. No lesions.  MUSCULOSKELETAL: No arthritis. No swelling. No gout.  NEUROLOGIC: No numbness, tingling, or ataxia. No seizure-type activity.  PSYCHIATRIC: No anxiety. No insomnia. No ADD.    Vitals:   Filed Vitals:   09/25/15 0700 09/25/15 0800 09/25/15 0900 09/25/15 1000  BP: 107/58 114/67 93/65 121/54  Pulse: 88 95 94 96  Temp:  98 F (36.7 C)    TempSrc:  Oral    Resp: '17  25 21 26  '$ Height:      Weight:      SpO2: 96% 94% 96% 97%    Wt Readings from Last 3 Encounters:  09/24/15 81.2 kg (179 lb 0.2 oz)  09/18/15 84.823 kg (187 lb)  09/06/15 81.647 kg (180 lb)     Intake/Output Summary (Last 24 hours) at 09/25/15 1049 Last data filed at 09/25/15 0800  Gross per 24 hour  Intake   3072 ml  Output    600 ml  Net   2472 ml    Physical Exam:   GENERAL: Pleasant-appearing in no apparent distress.  HEAD, EYES, EARS, NOSE  AND THROAT: Atraumatic, normocephalic. Extraocular muscles are intact. Pupils equal and reactive to light. Sclerae anicteric. No conjunctival injection. No oro-pharyngeal erythema.  NECK: Supple. There is no jugular venous distention. No bruits, no lymphadenopathy, no thyromegaly.  HEART: Regular rate and rhythm,. No murmurs, no rubs, no clicks.  LUNGS: Clear to auscultation bilaterally. No rales or rhonchi. No wheezes.  ABDOMEN: Distended, diminished bowel sounds there is no rebound tenderness or guarding EXTREMITIES: No evidence of any cyanosis, clubbing, or peripheral edema.  +2 pedal and radial pulses bilaterally.  NEUROLOGIC: The patient is alert, awake, and oriented x3 with no focal motor or sensory deficits appreciated bilaterally.  SKIN: Moist and warm with no rashes appreciated.  Psych: Not anxious, depressed LN: No inguinal LN enlargement    Antibiotics   Anti-infectives    None      Medications   Scheduled Meds: . aspirin EC  81 mg Oral Daily  . clopidogrel  75 mg Oral Q breakfast  . cyanocobalamin  250 mcg Oral Daily  . enoxaparin (LOVENOX) injection  40 mg Subcutaneous Q24H  . insulin aspart  0-15 Units Subcutaneous TID WC  . insulin aspart  0-5 Units Subcutaneous QHS  . loratadine  10 mg Oral Daily  . pantoprazole  40 mg Oral Daily  . simvastatin  40 mg Oral q1800  . sodium chloride flush  3 mL Intravenous Q12H   Continuous Infusions: . sodium chloride 30 mL/hr at 09/24/15 2221  . norepinephrine  (LEVOPHED) Adult infusion 2 mcg/min (09/25/15 1000)   PRN Meds:.acetaminophen **OR** acetaminophen, alum & mag hydroxide-simeth, morphine injection, ondansetron **OR** ondansetron (ZOFRAN) IV, oxyCODONE-acetaminophen, phenol, sodium chloride   Data Review:   Micro Results No results found for this or any previous visit (from the past 240 hour(s)).  Radiology Reports Ct Abdomen Pelvis Wo Contrast  09/24/2015  ADDENDUM REPORT: 09/24/2015 13:15 ADDENDUM: It should be noted the kidneys are well visualized and demonstrate no definitive obstructive change. No renal calculi are seen although heavy renal vascular calcification is noted. Electronically Signed   By: Inez Catalina M.D.   On: 09/24/2015 13:15  09/24/2015  CLINICAL DATA:  Postop abdominal pain, history of recent colonic surgery EXAM: CT ABDOMEN AND PELVIS WITHOUT CONTRAST TECHNIQUE: Multidetector CT imaging of the abdomen and pelvis was performed following the standard protocol without IV contrast. COMPARISON:  04/26/2015. FINDINGS: Lung bases are free of acute infiltrate or sizable effusion. The liver, spleen, adrenal glands and pancreas are all normal in their CT appearance. The gallbladder is well distended and demonstrates multiple dependent gallstones. No definitive pericholecystic fluid is seen. Small amounts of free fluid are noted surrounding the liver and spleen. These changes are likely related to the recent colonic resection. Postsurgical changes are noted in the right colon consistent with the patient's given clinical history. No obstructive changes are seen. A tiny focus of air is noted adjacent to the suture line again likely related to the recent surgery. The anastomosis appears widely patent. Changes of prior sigmoid colectomy are also noted. No significant obstructive changes are noted. Aortic stent graft is noted in satisfactory position. Comparison with prior exam shows a stable appearing aneurysm sac. Iliac calcifications are seen  without aneurysmal dilatation. The bladder is well distended. Prostate calcifications are seen. No significant lymphadenopathy is noted. The osseous structures show degenerative change of the lumbar spine. IMPRESSION: Small amounts of free fluid and free air likely related to the recent colonic surgery. No significant findings to suggest acute perforation are noted at  this time. Multiple gallstones without definitive complicating factors. Electronically Signed: By: Inez Catalina M.D. On: 09/24/2015 12:29   US Renal  09/18/2015  CLINICAL DATA:  Acute renal failure. EXAM: RENAL / URINARY TRACT ULTRASOUND COMPLETE COMPARISON:  CT of the abdomen and pelvis 04/26/2015 FINDINGS: Right Kidney: Length: 11.1. Echogenicity within normal limits. No mass or hydronephrosis visualized. Left Kidney: Length: 11.7. Echogenicity within normal limits. No mass or hydronephrosis visualized. There is a questionable exophytic left lower pole renal mass measuring 1.9 x 1.4 x 1.4 cm. Bladder: Appears normal for degree of bladder distention. IMPRESSION: No evidence of hydronephrosis. Questionable exophytic left lower pole renal mass versus complicated cyst measuring 1.9 cm. Attention on follow-up is recommended. Electronically Signed   By: Fidela Salisbury M.D.   On: 09/18/2015 10:27   Dg Chest Portable 1 View  09/24/2015  CLINICAL DATA:  Central line placement.  NG tube placement EXAM: PORTABLE CHEST 1 VIEW COMPARISON:  09/24/2015 FINDINGS: Right central line tip is in the SVC. No pneumothorax. NG tube enters the stomach. Heart is upper limits normal in size. Bibasilar opacities, likely atelectasis. Mild vascular congestion. No effusions or acute bony abnormality. IMPRESSION: Right central line tip in the SVC. No pneumothorax. NG tube enters the stomach. Mild vascular congestion and bibasilar atelectasis. Electronically Signed   By: Rolm Baptise M.D.   On: 09/24/2015 18:01   Dg Chest Port 1 View  09/24/2015  CLINICAL DATA:   Recent colon resection, epigastric pain, shortness of breath, last bowel movement yesterday, hypertension, diabetes mellitus, coronary artery disease post angioplasty, hyperlipidemia, GERD EXAM: PORTABLE CHEST 1 VIEW COMPARISON:  Portable exam 1000 hours compared to 04/14/2014 FINDINGS: Enlargement of cardiac silhouette. Atherosclerotic calcification aorta. Mediastinal contours and pulmonary vascularity normal for technique. Lungs clear. No pleural effusion or pneumothorax. Bones demineralized. IMPRESSION: Minimal enlargement of cardiac silhouette. No acute abnormalities. Electronically Signed   By: Lavonia Dana M.D.   On: 09/24/2015 10:25     CBC  Recent Labs Lab 09/19/15 0517 09/21/15 0511 09/24/15 0947 09/25/15 0224  WBC 7.3 7.0 18.3* 15.4*  HGB 10.3* 10.5* 12.3* 10.0*  HCT 31.0* 31.8* 37.1* 30.6*  PLT 185 205 489* 311  MCV 85.4 85.1 84.2 84.1  MCH 28.3 28.2 28.0 27.5  MCHC 33.2 33.1 33.3 32.6  RDW 15.5* 15.1* 15.7* 16.1*  LYMPHSABS 0.9*  --  1.1  --   MONOABS 0.9  --  0.7  --   EOSABS 0.3  --  0.0  --   BASOSABS 0.1  --  0.1  --     Chemistries   Recent Labs Lab 09/19/15 0517 09/19/15 0756 09/20/15 0800 09/24/15 0947 09/25/15 0224  NA 139 138 137 137 136  K 4.4 4.4 4.5 3.9 4.4  CL 109 109 106 99* 105  CO2 '24 23 26 22 '$ 21*  GLUCOSE 133* 142* 139* 166* 124*  BUN 26* 25* 20 29* 34*  CREATININE 1.30* 1.22 1.03 2.15* 1.69*  CALCIUM 8.7* 8.5* 8.8* 9.0 7.9*  AST  --   --   --  36  --   ALT  --   --   --  20  --   ALKPHOS  --   --   --  55  --   BILITOT  --   --   --  1.3*  --    ------------------------------------------------------------------------------------------------------------------ estimated creatinine clearance is 34.9 mL/min (by C-G formula based on Cr of 1.69). ------------------------------------------------------------------------------------------------------------------ No results for input(s): HGBA1C in the last 72  hours. ------------------------------------------------------------------------------------------------------------------ No results for input(s): CHOL, HDL, LDLCALC, TRIG, CHOLHDL, LDLDIRECT in the last 72 hours. ------------------------------------------------------------------------------------------------------------------ No results for input(s): TSH, T4TOTAL, T3FREE, THYROIDAB in the last 72 hours.  Invalid input(s): FREET3 ------------------------------------------------------------------------------------------------------------------ No results for input(s): VITAMINB12, FOLATE, FERRITIN, TIBC, IRON, RETICCTPCT in the last 72 hours.  Coagulation profile No results for input(s): INR, PROTIME in the last 168 hours.  No results for input(s): DDIMER in the last 72 hours.  Cardiac Enzymes  Recent Labs Lab 09/24/15 2013 09/25/15 0224 09/25/15 0828  TROPONINI 5.15* 6.59* 6.77*   ------------------------------------------------------------------------------------------------------------------ Invalid input(s): POCBNP    Assessment & Plan   82 year old male status post colon resection and postoperative inferior ST elevation MI who presents with abdominal pain which is now subsided but now has acute renal failure.  1. Acute renal failure: Possibly due to dehydration improved with IV fluids.  2. Abdominal pain: Status post recent colonic resection, surgery is following the patient KUB is ordered we will let them address this issue since his patient is recently status post surgery.  3. Recent ST elevation MI: Patient has a drug-eluting stent in RCA. He has been off of Plavix for at least 2 days. Continue aspirin and Plavix. Seen by Dr. Fletcher Anon, troponin trending down continue to monitor  4. Hypotension:  Could be related to #2, dehydration- continue aggressive IV fluids, Levophed  5. Type 2 diabetes: Hold by mouth medications. Continue sliding scale insulin and ADA diet.       Code Status Orders        Start     Ordered   09/24/15 1846  Full code   Continuous     09/24/15 1845    Code Status History    Date Active Date Inactive Code Status Order ID Comments User Context   09/17/2015  2:05 PM 09/21/2015  3:59 PM Full Code [id number]  Wellington Hampshire, MD Inpatient   09/14/2015  4:25 PM 09/17/2015  2:05 PM Full Code [id number]  Leonie Green, MD Inpatient    Advance Directive Documentation        Most Recent Value   Type of Advance Directive  Living will   Pre-existing out of facility DNR order (yellow form or pink MOST form)     "MOST" Form in Place?             Consults surgery cardiology DVT Prophylaxis  Lovenox   Lab Results  Component Value Date   PLT 311 09/25/2015     Time Spent in minutes   35 minutes of critical care time he still on pressors high risk of cardiopulmonary arrest Dustin Flock M.D on 09/25/2015 at 10:49 AM  Between 7am to 6pm - Pager - [phone]  After 6pm go to [email or web address] - password [email or web address] [facility]   Office  [phone]

## 2015-09-25 NOTE — Plan of Care (Signed)
Problem: Phase I Progression Outcomes Goal: Dyspnea controlled at rest Outcome: Completed/Met Date Met:  09/25/15 Pts breathing is regular, chest expansion is symmetrical, not experiencing any SOB. Pt sating 95 or greater on room air.  Goal: Pain controlled with appropriate interventions Outcome: Progressing Pt pain has been controlled with medication. Pain goal remains at 2. Goal: Hemodynamically stable Outcome: Completed/Met Date Met:  09/25/15 Pt has been off pressors since noon today. MAP has been greater than 65. Pt not experiencing any dizziness or lightheadedness. CVP has been ranging between 10-14.

## 2015-09-25 NOTE — Progress Notes (Signed)
ANTICOAGULATION CONSULT NOTE - Follow Up  Pharmacy Consult for Lovenox  Indication: VTE prophylaxis  Allergies  Allergen Reactions  . Lipitor [Atorvastatin] Other (See Comments)    "leg cramps"  . Pravachol [Pravastatin Sodium] Other (See Comments)    "leg cramps"  . Riomet [Metformin Hcl] Diarrhea  . Vytorin [Ezetimibe-Simvastatin] Other (See Comments)    "leg cramps"    Patient Measurements: Height: '5\' 6"'$  (167.6 cm) Weight: 179 lb 0.2 oz (81.2 kg) IBW/kg (Calculated) : 63.8  Vital Signs: Temp: 98 F (36.7 C) (01/28 0800) Temp Source: Oral (01/28 0800) BP: 121/54 mmHg (01/28 1000) Pulse Rate: 96 (01/28 1000)  Labs:  Recent Labs  09/24/15 0947 09/24/15 2013 09/25/15 0224 09/25/15 0828  HGB 12.3*  --  10.0*  --   HCT 37.1*  --  30.6*  --   PLT 489*  --  311  --   CREATININE 2.15*  --  1.69*  --   TROPONINI 5.05* 5.15* 6.59* 6.77*    Estimated Creatinine Clearance: 34.9 mL/min (by C-G formula based on Cr of 1.69).   Medical History: Past Medical History  Diagnosis Date  . Hypertension   . Diabetes mellitus without complication ([place])   . Hyperlipidemia   . Coronary artery disease   . neuropathy   . Low testosterone   . Neuropathy ([place])   . Anemia   . B12 deficiency   . GERD (gastroesophageal reflux disease)   . Skin cancer     Basal Cell  . Adenocarcinoma ([place])   . AAA (abdominal aortic aneurysm) ([place])   . Diverticulosis     Medications:  Prescriptions prior to admission  Medication Sig Dispense Refill Last Dose  . aspirin EC 81 MG tablet Take 81 mg by mouth daily.   09/23/2015 at Unknown time  . clopidogrel (PLAVIX) 75 MG tablet Take 1 tablet (75 mg total) by mouth daily with breakfast. 30 tablet 11 09/21/2015  . glimepiride (AMARYL) 4 MG tablet Take 4 mg by mouth daily with breakfast.   09/23/2015 at Unknown time  . loratadine (CLARITIN) 10 MG tablet Take 10 mg by mouth daily.   09/23/2015 at Unknown time  . metFORMIN (GLUCOPHAGE) 500 MG tablet Take  500 mg by mouth 2 (two) times daily with a meal.    09/23/2015 at Unknown time  . metoprolol succinate (TOPROL-XL) 25 MG 24 hr tablet Take 25 mg by mouth daily.   09/23/2015 at Unknown time  . oxyCODONE-acetaminophen (PERCOCET/ROXICET) 5-325 MG per tablet Take by mouth every 4 (four) hours as needed for severe pain.   PRN at PRN  . pantoprazole (PROTONIX) 40 MG tablet Take 40 mg by mouth daily.    09/23/2015 at Unknown time  . simvastatin (ZOCOR) 40 MG tablet Take 40 mg by mouth daily at 6 PM.    Past Week at Unknown time  . vitamin B-12 (CYANOCOBALAMIN) 250 MCG tablet Take 500 mcg by mouth daily.   09/23/2015 at Unknown time    Assessment: CrCl = 28 ml/min  Goal of Therapy:  DVT prophylaxis    Plan:  Lovenox 40 mg SQ Q24H originally ordered, then adjusted to lovenox '30mg'$  SQ q24hr for CrCl <30 ml/min.  CrCl now 34.9 ml/min. Orders readjusted to lovenox '40mg'$  SQ q24hr.  Pharmacy will continue to monitor renal function.  Roe Coombs, PharmD Pharmacy Resident 09/25/2015

## 2015-09-26 LAB — BASIC METABOLIC PANEL
Anion gap: 9 (ref 5–15)
BUN: 33 mg/dL — ABNORMAL HIGH (ref 6–20)
CHLORIDE: 106 mmol/L (ref 101–111)
CO2: 21 mmol/L — ABNORMAL LOW (ref 22–32)
Calcium: 8.2 mg/dL — ABNORMAL LOW (ref 8.9–10.3)
Creatinine, Ser: 1.3 mg/dL — ABNORMAL HIGH (ref 0.61–1.24)
GFR calc non Af Amer: 50 mL/min — ABNORMAL LOW (ref >60)
GFR, EST AFRICAN AMERICAN: 58 mL/min — AB (ref >60)
Glucose, Bld: 116 mg/dL — ABNORMAL HIGH (ref 65–99)
POTASSIUM: 4.7 mmol/L (ref 3.5–5.1)
SODIUM: 136 mmol/L (ref 135–145)

## 2015-09-26 LAB — CBC
HCT: 31.2 % — ABNORMAL LOW (ref 40.0–52.0)
Hemoglobin: 10.2 g/dL — ABNORMAL LOW (ref 13.0–18.0)
MCH: 27.5 pg (ref 26.0–34.0)
MCHC: 32.6 g/dL (ref 32.0–36.0)
MCV: 84.4 fL (ref 80.0–100.0)
Platelets: 283 10*3/uL (ref 150–440)
RBC: 3.69 MIL/uL — ABNORMAL LOW (ref 4.40–5.90)
RDW: 15.9 % — ABNORMAL HIGH (ref 11.5–14.5)
WBC: 17.2 10*3/uL — AB (ref 3.8–10.6)

## 2015-09-26 LAB — GLUCOSE, CAPILLARY
GLUCOSE-CAPILLARY: 106 mg/dL — AB (ref 65–99)
GLUCOSE-CAPILLARY: 127 mg/dL — AB (ref 65–99)
GLUCOSE-CAPILLARY: 125 mg/dL — AB (ref 65–99)
GLUCOSE-CAPILLARY: 137 mg/dL — AB (ref 65–99)

## 2015-09-26 MED ORDER — SODIUM CHLORIDE 0.9 % IV SOLN
1.0000 g | Freq: Two times a day (BID) | INTRAVENOUS | Status: DC
  Administered 2015-09-26 – 2015-09-27 (×3): 1 g via INTRAVENOUS
  Filled 2015-09-26 (×5): qty 1

## 2015-09-26 MED ORDER — SODIUM CHLORIDE 0.9 % IV SOLN: 1.0000 g | Freq: Three times a day (TID) | INTRAVENOUS | Status: DC

## 2015-09-26 MED ORDER — BISACODYL 10 MG RE SUPP
10.0000 mg | Freq: Once | RECTAL | Status: AC
  Administered 2015-09-26: 10 mg via RECTAL
  Filled 2015-09-26: qty 1

## 2015-09-26 MED ORDER — METOCLOPRAMIDE HCL 5 MG PO TABS
5.0000 mg | ORAL_TABLET | Freq: Three times a day (TID) | ORAL | Status: DC
  Administered 2015-09-26 – 2015-10-02 (×24): 5 mg via ORAL
  Filled 2015-09-26 (×24): qty 1

## 2015-09-26 NOTE — Progress Notes (Signed)
RN came in and checked on patient. Patient resting comfortably in chair, no apparent distress. RN repositioned O2 sat off hand (patient had been moving hand throughout morning and that was interferring with O2 sat collection). Patient reports no pain at this time ("When I'm sitting still I'm good") and denied any respiratory distress. Patient refused oxygen placement to help with tachypnea ("I'm fine right now and I don't need that").

## 2015-09-26 NOTE — Progress Notes (Signed)
ANTIBIOTIC CONSULT NOTE - INITIAL  Pharmacy Consult for meropenem Indication: intra abdominal infection  Allergies  Allergen Reactions  . Lipitor [Atorvastatin] Other (See Comments)    "leg cramps"  . Pravachol [Pravastatin Sodium] Other (See Comments)    "leg cramps"  . Riomet [Metformin Hcl] Diarrhea  . Vytorin [Ezetimibe-Simvastatin] Other (See Comments)    "leg cramps"    Patient Measurements: Height: '5\' 6"'$  (167.6 cm) Weight: 179 lb 0.2 oz (81.2 kg) IBW/kg (Calculated) : 63.8 Adjusted Body Weight:   Vital Signs: Temp: 98.3 F (36.8 C) (01/29 0823) Temp Source: Oral (01/29 0823) BP: 106/60 mmHg (01/29 0900) Pulse Rate: 94 (01/29 0900) Intake/Output from previous day: 01/28 0701 - 01/29 0700 In: 313.3 [P.O.:30; I.V.:283.3] Out: 975 [Urine:975] Intake/Output from this shift:    Labs:  Recent Labs  09/24/15 0947 09/25/15 0224 09/26/15 0432  WBC 18.3* 15.4* 17.2*  HGB 12.3* 10.0* 10.2*  PLT 489* 311 283  CREATININE 2.15* 1.69* 1.30*   Estimated Creatinine Clearance: 45.4 mL/min (by C-G formula based on Cr of 1.3). No results for input(s): VANCOTROUGH, VANCOPEAK, VANCORANDOM, GENTTROUGH, GENTPEAK, GENTRANDOM, TOBRATROUGH, TOBRAPEAK, TOBRARND, AMIKACINPEAK, AMIKACINTROU, AMIKACIN in the last 72 hours.   Microbiology: Recent Results (from the past 720 hour(s))  Surgical pcr screen     Status: None   Collection Time: 09/06/15  2:40 PM  Result Value Ref Range Status   MRSA, PCR NEGATIVE NEGATIVE Final   Staphylococcus aureus NEGATIVE NEGATIVE Final    Comment:        The Xpert SA Assay (FDA approved for NASAL specimens in patients over 67 years of age), is one component of a comprehensive surveillance program.  Test performance has been validated by [facility] for patients greater than or equal to 80 year old. It is not intended to diagnose infection nor to guide or monitor treatment.     Medical History: Past Medical History  Diagnosis Date  .  Hypertension   . Diabetes mellitus without complication ([place])   . Hyperlipidemia   . Coronary artery disease   . neuropathy   . Low testosterone   . Neuropathy ([place])   . Anemia   . B12 deficiency   . GERD (gastroesophageal reflux disease)   . Skin cancer     Basal Cell  . Adenocarcinoma ([place])   . AAA (abdominal aortic aneurysm) ([place])   . Diverticulosis     Medications:  Scheduled:  . aspirin EC  81 mg Oral Daily  . clopidogrel  75 mg Oral Q breakfast  . cyanocobalamin  250 mcg Oral Daily  . insulin aspart  0-15 Units Subcutaneous TID WC  . insulin aspart  0-5 Units Subcutaneous QHS  . loratadine  10 mg Oral Daily  . meropenem (MERREM) IV  1 g Intravenous Q12H  . metoCLOPramide  5 mg Oral TID AC & HS  . pantoprazole  40 mg Oral Daily  . simvastatin  40 mg Oral q1800  . sodium chloride flush  3 mL Intravenous Q12H   Assessment: Pt is a 82 year old male who presents with abdominal pain s/p colon resection. IM ordered meropenem for possible infection  Goal of Therapy:    Plan:  meropenem 1g q 12hr due to reduced renal function  Nicholas Parker 09/26/2015,11:29 AM

## 2015-09-26 NOTE — Progress Notes (Signed)
[place] at [facility]                                                                                                                                                                                            Patient Demographics   Nicholas Parker, is a 82 y.o. male, DOB - 02/09/1934, PPI:[id number]  Admit date - 09/24/2015   Admitting Physician Bettey Costa, MD  Outpatient Primary MD for the patient is [place], MD   LOS - 2  Subjective: She continues to complain of abdominal pain. His Levophed has been titrated  Review of Systems:   CONSTITUTIONAL: No documented fever. No fatigue, weakness. No weight gain, no weight loss.  EYES: No blurry or double vision.  ENT: No tinnitus. No postnasal drip. No redness of the oropharynx.  RESPIRATORY: No cough, no wheeze, no hemoptysis. No dyspnea.  CARDIOVASCULAR: No chest pain. No orthopnea. No palpitations. No syncope.  GASTROINTESTINAL: No nausea, no vomiting or diarrhea. Positive abdominal pain. No melena or hematochezia.  GENITOURINARY: No dysuria or hematuria.  ENDOCRINE: No polyuria or nocturia. No heat or cold intolerance.  HEMATOLOGY: No anemia. No bruising. No bleeding.  INTEGUMENTARY: No rashes. No lesions.  MUSCULOSKELETAL: No arthritis. No swelling. No gout.  NEUROLOGIC: No numbness, tingling, or ataxia. No seizure-type activity.  PSYCHIATRIC: No anxiety. No insomnia. No ADD.    Vitals:   Filed Vitals:   09/26/15 0800 09/26/15 0823 09/26/15 0830 09/26/15 0900  BP: 96/55  125/65 106/60  Pulse: 95 98 99 94  Temp:  98.3 F (36.8 C)    TempSrc:  Oral    Resp: 26 31 35 21  Height:      Weight:      SpO2: 100% 100% 100% 100%    Wt Readings from Last 3 Encounters:  09/24/15 81.2 kg (179 lb 0.2 oz)  09/18/15 84.823 kg (187 lb)  09/06/15 81.647 kg (180 lb)     Intake/Output Summary (Last 24 hours) at 09/26/15 1124 Last data filed at 09/26/15 0240  Gross per 24 hour  Intake   83.8  ml  Output    575 ml  Net -491.2 ml    Physical Exam:   GENERAL: Pleasant-appearing in no apparent distress.  HEAD, EYES, EARS, NOSE AND THROAT: Atraumatic, normocephalic. Extraocular muscles are intact. Pupils equal and reactive to light. Sclerae anicteric. No conjunctival injection. No oro-pharyngeal erythema.  NECK: Supple. There is no jugular venous distention. No bruits, no lymphadenopathy, no thyromegaly.  HEART: Regular rate and rhythm,. No murmurs, no rubs, no clicks.  LUNGS: Clear to auscultation bilaterally. No rales  or rhonchi. No wheezes.  ABDOMEN: Distended, diminished bowel sounds there is no rebound tenderness or guarding EXTREMITIES: No evidence of any cyanosis, clubbing, or peripheral edema.  +2 pedal and radial pulses bilaterally.  NEUROLOGIC: The patient is alert, awake, and oriented x3 with no focal motor or sensory deficits appreciated bilaterally.  SKIN: Moist and warm with no rashes appreciated.  Psych: Not anxious, depressed LN: No inguinal LN enlargement    Antibiotics   Anti-infectives    Start     Dose/Rate Route Frequency Ordered Stop   09/26/15 1400  meropenem (MERREM) 1 g in sodium chloride 0.9 % 100 mL IVPB     1 g 200 mL/hr over 30 Minutes Intravenous 3 times per day 09/26/15 1124        Medications   Scheduled Meds: . aspirin EC  81 mg Oral Daily  . clopidogrel  75 mg Oral Q breakfast  . cyanocobalamin  250 mcg Oral Daily  . insulin aspart  0-15 Units Subcutaneous TID WC  . insulin aspart  0-5 Units Subcutaneous QHS  . loratadine  10 mg Oral Daily  . meropenem (MERREM) IV  1 g Intravenous 3 times per day  . metoCLOPramide  5 mg Oral TID AC & HS  . pantoprazole  40 mg Oral Daily  . simvastatin  40 mg Oral q1800  . sodium chloride flush  3 mL Intravenous Q12H   Continuous Infusions: . norepinephrine (LEVOPHED) Adult infusion Stopped (09/25/15 1200)   PRN Meds:.acetaminophen **OR** acetaminophen, alum & mag hydroxide-simeth, morphine  injection, ondansetron **OR** ondansetron (ZOFRAN) IV, oxyCODONE-acetaminophen, phenol, sodium chloride   Data Review:   Micro Results No results found for this or any previous visit (from the past 240 hour(s)).  Radiology Reports Ct Abdomen Pelvis Wo Contrast  09/24/2015  ADDENDUM REPORT: 09/24/2015 13:15 ADDENDUM: It should be noted the kidneys are well visualized and demonstrate no definitive obstructive change. No renal calculi are seen although heavy renal vascular calcification is noted. Electronically Signed   By: Inez Catalina M.D.   On: 09/24/2015 13:15  09/24/2015  CLINICAL DATA:  Postop abdominal pain, history of recent colonic surgery EXAM: CT ABDOMEN AND PELVIS WITHOUT CONTRAST TECHNIQUE: Multidetector CT imaging of the abdomen and pelvis was performed following the standard protocol without IV contrast. COMPARISON:  04/26/2015. FINDINGS: Lung bases are free of acute infiltrate or sizable effusion. The liver, spleen, adrenal glands and pancreas are all normal in their CT appearance. The gallbladder is well distended and demonstrates multiple dependent gallstones. No definitive pericholecystic fluid is seen. Small amounts of free fluid are noted surrounding the liver and spleen. These changes are likely related to the recent colonic resection. Postsurgical changes are noted in the right colon consistent with the patient's given clinical history. No obstructive changes are seen. A tiny focus of air is noted adjacent to the suture line again likely related to the recent surgery. The anastomosis appears widely patent. Changes of prior sigmoid colectomy are also noted. No significant obstructive changes are noted. Aortic stent graft is noted in satisfactory position. Comparison with prior exam shows a stable appearing aneurysm sac. Iliac calcifications are seen without aneurysmal dilatation. The bladder is well distended. Prostate calcifications are seen. No significant lymphadenopathy is noted.  The osseous structures show degenerative change of the lumbar spine. IMPRESSION: Small amounts of free fluid and free air likely related to the recent colonic surgery. No significant findings to suggest acute perforation are noted at this time. Multiple gallstones without definitive complicating  factors. Electronically Signed: By: Inez Catalina M.D. On: 09/24/2015 12:29   Dg Abd 1 View  09/25/2015  CLINICAL DATA:  82 year old male with abdominal distension and pain. Recent colonic resection. EXAM: ABDOMEN - 1 VIEW COMPARISON:  09/24/2015 CT FINDINGS: Gaseous distension of the colon is noted. There is some oral contrast from recent CT present within the proximal colon. No dilated small bowel loops are noted. Surgical staples and clips are identified as well as an aortic stent graft. IMPRESSION: Gaseous colonic distention likely representing ileus. No dilated small bowel loops identified. Electronically Signed   By: Margarette Canada M.D.   On: 09/25/2015 10:53   US Renal  09/18/2015  CLINICAL DATA:  Acute renal failure. EXAM: RENAL / URINARY TRACT ULTRASOUND COMPLETE COMPARISON:  CT of the abdomen and pelvis 04/26/2015 FINDINGS: Right Kidney: Length: 11.1. Echogenicity within normal limits. No mass or hydronephrosis visualized. Left Kidney: Length: 11.7. Echogenicity within normal limits. No mass or hydronephrosis visualized. There is a questionable exophytic left lower pole renal mass measuring 1.9 x 1.4 x 1.4 cm. Bladder: Appears normal for degree of bladder distention. IMPRESSION: No evidence of hydronephrosis. Questionable exophytic left lower pole renal mass versus complicated cyst measuring 1.9 cm. Attention on follow-up is recommended. Electronically Signed   By: Fidela Salisbury M.D.   On: 09/18/2015 10:27   Dg Chest Portable 1 View  09/24/2015  CLINICAL DATA:  Central line placement.  NG tube placement EXAM: PORTABLE CHEST 1 VIEW COMPARISON:  09/24/2015 FINDINGS: Right central line tip is in the SVC. No  pneumothorax. NG tube enters the stomach. Heart is upper limits normal in size. Bibasilar opacities, likely atelectasis. Mild vascular congestion. No effusions or acute bony abnormality. IMPRESSION: Right central line tip in the SVC. No pneumothorax. NG tube enters the stomach. Mild vascular congestion and bibasilar atelectasis. Electronically Signed   By: Rolm Baptise M.D.   On: 09/24/2015 18:01   Dg Chest Port 1 View  09/24/2015  CLINICAL DATA:  Recent colon resection, epigastric pain, shortness of breath, last bowel movement yesterday, hypertension, diabetes mellitus, coronary artery disease post angioplasty, hyperlipidemia, GERD EXAM: PORTABLE CHEST 1 VIEW COMPARISON:  Portable exam 1000 hours compared to 04/14/2014 FINDINGS: Enlargement of cardiac silhouette. Atherosclerotic calcification aorta. Mediastinal contours and pulmonary vascularity normal for technique. Lungs clear. No pleural effusion or pneumothorax. Bones demineralized. IMPRESSION: Minimal enlargement of cardiac silhouette. No acute abnormalities. Electronically Signed   By: Lavonia Dana M.D.   On: 09/24/2015 10:25     CBC  Recent Labs Lab 09/21/15 0511 09/24/15 0947 09/25/15 0224 09/26/15 0432  WBC 7.0 18.3* 15.4* 17.2*  HGB 10.5* 12.3* 10.0* 10.2*  HCT 31.8* 37.1* 30.6* 31.2*  PLT 205 489* 311 283  MCV 85.1 84.2 84.1 84.4  MCH 28.2 28.0 27.5 27.5  MCHC 33.1 33.3 32.6 32.6  RDW 15.1* 15.7* 16.1* 15.9*  LYMPHSABS  --  1.1  --   --   MONOABS  --  0.7  --   --   EOSABS  --  0.0  --   --   BASOSABS  --  0.1  --   --     Chemistries   Recent Labs Lab 09/20/15 0800 09/24/15 0947 09/25/15 0224 09/26/15 0432  NA 137 137 136 136  K 4.5 3.9 4.4 4.7  CL 106 99* 105 106  CO2 26 22 21* 21*  GLUCOSE 139* 166* 124* 116*  BUN 20 29* 34* 33*  CREATININE 1.03 2.15* 1.69* 1.30*  CALCIUM 8.8*  9.0 7.9* 8.2*  AST  --  36  --   --   ALT  --  20  --   --   ALKPHOS  --  55  --   --   BILITOT  --  1.3*  --   --     ------------------------------------------------------------------------------------------------------------------ estimated creatinine clearance is 45.4 mL/min (by C-G formula based on Cr of 1.3). ------------------------------------------------------------------------------------------------------------------ No results for input(s): HGBA1C in the last 72 hours. ------------------------------------------------------------------------------------------------------------------ No results for input(s): CHOL, HDL, LDLCALC, TRIG, CHOLHDL, LDLDIRECT in the last 72 hours. ------------------------------------------------------------------------------------------------------------------ No results for input(s): TSH, T4TOTAL, T3FREE, THYROIDAB in the last 72 hours.  Invalid input(s): FREET3 ------------------------------------------------------------------------------------------------------------------ No results for input(s): VITAMINB12, FOLATE, FERRITIN, TIBC, IRON, RETICCTPCT in the last 72 hours.  Coagulation profile No results for input(s): INR, PROTIME in the last 168 hours.  No results for input(s): DDIMER in the last 72 hours.  Cardiac Enzymes  Recent Labs Lab 09/24/15 2013 09/25/15 0224 09/25/15 0828  TROPONINI 5.15* 6.59* 6.77*   ------------------------------------------------------------------------------------------------------------------ Invalid input(s): POCBNP    Assessment & Plan   82 year old male status post colon resection and postoperative inferior ST elevation MI who presents with abdominal pain which is now subsided but now has acute renal failure.  1. Acute renal failure: Possibly due to dehydration continues to improve further   2. Abdominal pain: Status post recent colonic resection, surgery is following , abdominal pain persists WBC is elevated, I will start patient on empiric meropenem obtain blood cultures  3. Recent ST elevation MI: Patient has a  drug-eluting stent in RCA. He has been off of Plavix for at least 2 days. Continue aspirin and Plavix. Seen by Dr. Fletcher Anon, troponin trending down continue to monitor  4. Hypotension:  Could be related to #2, dehydration- now improved  5. Type 2 diabetes: Hold by mouth medications. Continue sliding scale insulin and ADA diet.      Code Status Orders        Start     Ordered   09/24/15 1846  Full code   Continuous     09/24/15 1845    Code Status History    Date Active Date Inactive Code Status Order ID Comments User Context   09/17/2015  2:05 PM 09/21/2015  3:59 PM Full Code [id number]  Wellington Hampshire, MD Inpatient   09/14/2015  4:25 PM 09/17/2015  2:05 PM Full Code [id number]  Leonie Green, MD Inpatient    Advance Directive Documentation        Most Recent Value   Type of Advance Directive  Living will   Pre-existing out of facility DNR order (yellow form or pink MOST form)     "MOST" Form in Place?             Consults surgery cardiology DVT Prophylaxis  Lovenox   Lab Results  Component Value Date   PLT 283 09/26/2015     Time Spent in minutes   32 minutes spent  Dustin Flock M.D on 09/26/2015 at 11:24 AM  Between 7am to 6pm - Pager - [phone]  After 6pm go to [email or web address] - password [email or web address] [place] [place] [facility]   Office  [phone]

## 2015-09-26 NOTE — Progress Notes (Signed)
RN spoke with Dr. Posey Pronto on the phone about patient. MD gave order for patient to transfer to any medical surgical floor with telemetry and for RN not to remove the patient's central line at this time. MD stated central line could be removed tomorrow.

## 2015-09-26 NOTE — Progress Notes (Signed)
Afebrile. Off pressor support. No nausea.  Passing some flatus. Feels abdomen is back to normal girth. Lungs: Clear. Cardio: RR. ABD: Modest distension, tympanitic. (Plain films yesterday showed majority of gas in transverse colon. Moderate RLQ tenderness to palpation. No referred pain. BS+, less pronounced than yesterday. Wound: Clean and dry.  Extrem: Calves: Soft. Labs: WBC up, HGB stable. Creatinine down. Blood sugars great. Doing well. Source of WBC still unclear. RLQ tenderness can be related to colectomy 12 days ago, but is a little more pronounced than usual.  Normal glucose would speak against occult infection in a patient with known history of diabetes. May have blood at site of colectomy with high dose Plavix post stent with resulting inflammation. Will start diet, add Reglan for GI motility and have patient receive one Dulcolax for remaining flatus.

## 2015-09-26 NOTE — Progress Notes (Signed)
Report called to [place] on [place]. Patient to be transferred to room 221. Patient and family (wife at bedside) aware of transfer.

## 2015-09-27 ENCOUNTER — Inpatient Hospital Stay (HOSPITAL_COMMUNITY)
Admit: 2015-09-27 | Discharge: 2015-09-27 | Disposition: A | Discharge status: Home / Self Care | Payer: Commercial Managed Care - HMO | Attending: Internal Medicine | Admitting: Internal Medicine

## 2015-09-27 ENCOUNTER — Inpatient Hospital Stay: Payer: Commercial Managed Care - HMO

## 2015-09-27 DIAGNOSIS — I472 Ventricular tachycardia: Secondary | ICD-10-CM

## 2015-09-27 LAB — CBC
HEMATOCRIT: 29.1 % — AB (ref 40.0–52.0)
HEMOGLOBIN: 9.5 g/dL — AB (ref 13.0–18.0)
MCH: 27.5 pg (ref 26.0–34.0)
MCHC: 32.8 g/dL (ref 32.0–36.0)
MCV: 83.8 fL (ref 80.0–100.0)
Platelets: 272 10*3/uL (ref 150–440)
RBC: 3.47 MIL/uL — ABNORMAL LOW (ref 4.40–5.90)
RDW: 15.6 % — AB (ref 11.5–14.5)
WBC: 13.9 10*3/uL — ABNORMAL HIGH (ref 3.8–10.6)

## 2015-09-27 LAB — TROPONIN I: Troponin I: 5.86 ng/mL — ABNORMAL HIGH (ref <0.031)

## 2015-09-27 LAB — BASIC METABOLIC PANEL
ANION GAP: 9 (ref 5–15)
BUN: 31 mg/dL — ABNORMAL HIGH (ref 6–20)
CALCIUM: 7.8 mg/dL — AB (ref 8.9–10.3)
CHLORIDE: 105 mmol/L (ref 101–111)
CO2: 24 mmol/L (ref 22–32)
CREATININE: 1.11 mg/dL (ref 0.61–1.24)
GFR calc Af Amer: >60 mL/min (ref >60)
GFR calc non Af Amer: >60 mL/min (ref >60)
GLUCOSE: 217 mg/dL — AB (ref 65–99)
Potassium: 3.9 mmol/L (ref 3.5–5.1)
Sodium: 138 mmol/L (ref 135–145)

## 2015-09-27 LAB — TSH: TSH: 3.246 u[IU]/mL (ref 0.350–4.500)

## 2015-09-27 LAB — GLUCOSE, CAPILLARY
GLUCOSE-CAPILLARY: 140 mg/dL — AB (ref 65–99)
GLUCOSE-CAPILLARY: 151 mg/dL — AB (ref 65–99)
GLUCOSE-CAPILLARY: 127 mg/dL — AB (ref 65–99)
Glucose-Capillary: 135 mg/dL — ABNORMAL HIGH (ref 65–99)
Glucose-Capillary: 154 mg/dL — ABNORMAL HIGH (ref 65–99)

## 2015-09-27 LAB — MRSA PCR SCREENING: MRSA by PCR: NEGATIVE

## 2015-09-27 LAB — MAGNESIUM: Magnesium: 2.4 mg/dL (ref 1.7–2.4)

## 2015-09-27 MED ORDER — AMIODARONE LOAD VIA INFUSION
150.0000 mg | Freq: Once | INTRAVENOUS | Status: AC
  Administered 2015-09-27: 150 mg via INTRAVENOUS
  Filled 2015-09-27 (×3): qty 83.34

## 2015-09-27 MED ORDER — LIDOCAINE IN D5W 4-5 MG/ML-% IV SOLN
2.0000 mg/min | INTRAVENOUS | Status: DC
  Administered 2015-09-27 (×2): 2 mg/min via INTRAVENOUS
  Filled 2015-09-27 (×2): qty 500

## 2015-09-27 MED ORDER — MAGNESIUM SULFATE IN D5W 10-5 MG/ML-% IV SOLN
1.0000 g | Freq: Once | INTRAVENOUS | Status: DC
  Filled 2015-09-27: qty 100

## 2015-09-27 MED ORDER — LIDOCAINE HCL (CARDIAC) 20 MG/ML IV SOLN
100.0000 mg | Freq: Once | INTRAVENOUS | Status: AC
  Administered 2015-09-27: 100 mg via INTRAVENOUS

## 2015-09-27 MED ORDER — SIMETHICONE 80 MG PO CHEW
80.0000 mg | CHEWABLE_TABLET | Freq: Four times a day (QID) | ORAL | Status: DC
Start: 2015-09-27 — End: 2015-10-02
  Administered 2015-09-27 – 2015-10-02 (×20): 80 mg via ORAL
  Filled 2015-09-27 (×21): qty 1

## 2015-09-27 MED ORDER — FLEET ENEMA 7-19 GM/118ML RE ENEM
1.0000 | ENEMA | Freq: Once | RECTAL | Status: AC
  Administered 2015-09-27: 1 via RECTAL

## 2015-09-27 MED ORDER — SODIUM CHLORIDE 0.9 % IV SOLN
INTRAVENOUS | Status: AC
  Administered 2015-09-27 (×2): via INTRAVENOUS

## 2015-09-27 MED ORDER — SODIUM CHLORIDE 0.9 % IV SOLN
1.0000 g | Freq: Three times a day (TID) | INTRAVENOUS | Status: DC
  Filled 2015-09-27: qty 1

## 2015-09-27 MED ORDER — SODIUM BICARBONATE 8.4 % IV SOLN
50.0000 meq | Freq: Once | INTRAVENOUS | Status: AC
  Administered 2015-09-27: 50 meq via INTRAVENOUS

## 2015-09-27 MED ORDER — SENNOSIDES-DOCUSATE SODIUM 8.6-50 MG PO TABS
1.0000 | ORAL_TABLET | Freq: Two times a day (BID) | ORAL | Status: DC
  Administered 2015-09-27 – 2015-10-03 (×12): 1 via ORAL
  Filled 2015-09-27 (×12): qty 1

## 2015-09-27 MED ORDER — DEXTROSE 5 % IV SOLN
2.0000 g | INTRAVENOUS | Status: DC
  Administered 2015-09-27 – 2015-09-28 (×2): 2 g via INTRAVENOUS
  Filled 2015-09-27 (×4): qty 2

## 2015-09-27 MED ORDER — MAGNESIUM SULFATE 50 % IJ SOLN
1.0000 g | Freq: Once | INTRAMUSCULAR | Status: AC
  Administered 2015-09-27: 1 g via INTRAVENOUS

## 2015-09-27 MED FILL — Medication: Qty: 1 | Status: AC

## 2015-09-27 NOTE — Progress Notes (Signed)
Called dr. Louellen Molder regarding pt and lidocaine gtt, he referred me to [facility] cardiology. He stated Dr Bea Laura was aware of pt, he reported info to him directly.  S/w Dr. Clayborn Bigness, he was not aware of consult for pt. Explained that dr Louellen Molder sw dr, Nehemiah Massed, he said to leave pt on lidocaine gtt through night and he will see pt in the am.

## 2015-09-27 NOTE — Care Management (Signed)
Patient admitted with abdominal pain from home.  Recent post operative MI after abdominal surgery.  Discharge home 09/21/2015.  It is documented wife declined home health.    Patient transferred to ic[facility] from [facility] for [place] 09/27/2015

## 2015-09-27 NOTE — Progress Notes (Signed)
He has tolerated his full liquids fairly well today.  He reports no nausea.  He reports no bowel movement or flatus today.  He has had some intermittent pain in the right lower abdomen.  On examination there is abdominal tympany and distention and moderate right lower quadrant tenderness.  His incision is healing satisfactorily.  I reviewed x-ray images which were done today which demonstrate marked gaseous distention of the colon consistent with ileus  Impression colonic ileus  Plan is to give Senokot 2 times per day and order one fleets enema.  Resume walking once cardiac condition permits

## 2015-09-27 NOTE — Progress Notes (Signed)
[place] at [facility]                                                                                                                                                                                            Patient Demographics   Nicholas Parker, is a 82 y.o. male, DOB - 1934-01-23, CHE:[id number]  Admit date - 09/24/2015   Admitting Physician Bettey Costa, MD  Outpatient Primary MD for the patient is [place] III, MD   LOS - 3  Subjective: Nicholas Parker overnight developed again abdominal pain also ventricular tachycardia had to go back to the ICU. Started lidocaine drip. Reports abdominal pain improved   Review of Systems:   CONSTITUTIONAL: No documented fever. No fatigue, weakness. No weight gain, no weight loss.  EYES: No blurry or double vision.  ENT: No tinnitus. No postnasal drip. No redness of the oropharynx.  RESPIRATORY: No cough, no wheeze, no hemoptysis. No dyspnea.  CARDIOVASCULAR: No chest pain. No orthopnea. No palpitations. No syncope.  GASTROINTESTINAL: No nausea, no vomiting or diarrhea. Positive abdominal pain. No melena or hematochezia.  GENITOURINARY: No dysuria or hematuria.  ENDOCRINE: No polyuria or nocturia. No heat or cold intolerance.  HEMATOLOGY: No anemia. No bruising. No bleeding.  INTEGUMENTARY: No rashes. No lesions.  MUSCULOSKELETAL: No arthritis. No swelling. No gout.  NEUROLOGIC: No numbness, tingling, or ataxia. No seizure-type activity.  PSYCHIATRIC: No anxiety. No insomnia. No ADD.    Vitals:   Filed Vitals:   09/27/15 0700 09/27/15 0745 09/27/15 0800 09/27/15 0900  BP: 110/69  120/69 111/62  Pulse: 96  97 95  Temp:  98.3 F (36.8 C)    TempSrc:  Axillary    Resp: '19  23 25  '$ Height:      Weight:      SpO2: 99%  98% 99%    Wt Readings from Last 3 Encounters:  09/24/15 81.2 kg (179 lb 0.2 oz)  09/18/15 84.823 kg (187 lb)  09/06/15 81.647 kg (180 lb)     Intake/Output Summary (Last 24 hours) at  09/27/15 1020 Last data filed at 09/27/15 0800  Gross per 24 hour  Intake 854.17 ml  Output    275 ml  Net 579.17 ml    Physical Exam:   GENERAL: Pleasant-appearing in no apparent distress.  HEAD, EYES, EARS, NOSE AND THROAT: Atraumatic, normocephalic. Extraocular muscles are intact. Pupils equal and reactive to light. Sclerae anicteric. No conjunctival injection. No oro-pharyngeal erythema.  NECK: Supple. There is no jugular venous distention. No bruits, no lymphadenopathy, no thyromegaly.  HEART: Regular rate and rhythm,. No murmurs, no rubs,  no clicks.  LUNGS: Clear to auscultation bilaterally. No rales or rhonchi. No wheezes.  ABDOMEN: Distended, diminished bowel sounds there is no rebound tenderness or guarding EXTREMITIES: No evidence of any cyanosis, clubbing, or peripheral edema.  +2 pedal and radial pulses bilaterally.  NEUROLOGIC: The patient is alert, awake, and oriented x3 with no focal motor or sensory deficits appreciated bilaterally.  SKIN: Moist and warm with no rashes appreciated.  Psych: Not anxious, depressed LN: No inguinal LN enlargement    Antibiotics   Anti-infectives    Start     Dose/Rate Route Frequency Ordered Stop   09/26/15 1400  meropenem (MERREM) 1 g in sodium chloride 0.9 % 100 mL IVPB  Status:  Discontinued     1 g 200 mL/hr over 30 Minutes Intravenous 3 times per day 09/26/15 1124 09/26/15 1126   09/26/15 1130  meropenem (MERREM) 1 g in sodium chloride 0.9 % 100 mL IVPB     1 g 200 mL/hr over 30 Minutes Intravenous Every 12 hours 09/26/15 1126        Medications   Scheduled Meds: . aspirin EC  81 mg Oral Daily  . clopidogrel  75 mg Oral Q breakfast  . cyanocobalamin  250 mcg Oral Daily  . insulin aspart  0-15 Units Subcutaneous TID WC  . insulin aspart  0-5 Units Subcutaneous QHS  . loratadine  10 mg Oral Daily  . meropenem (MERREM) IV  1 g Intravenous Q12H  . metoCLOPramide  5 mg Oral TID AC & HS  . pantoprazole  40 mg Oral Daily  .  simvastatin  40 mg Oral q1800  . sodium chloride flush  3 mL Intravenous Q12H   Continuous Infusions: . sodium chloride 125 mL/hr at 09/27/15 0540  . lidocaine 2 mg/min (09/27/15 0435)   PRN Meds:.acetaminophen **OR** acetaminophen, alum & mag hydroxide-simeth, morphine injection, ondansetron **OR** ondansetron (ZOFRAN) IV, oxyCODONE-acetaminophen, phenol, sodium chloride   Data Review:   Micro Results No results found for this or any previous visit (from the past 240 hour(s)).  Radiology Reports Ct Abdomen Pelvis Wo Contrast  09/24/2015  ADDENDUM REPORT: 09/24/2015 13:15 ADDENDUM: It should be noted the kidneys are well visualized and demonstrate no definitive obstructive change. No renal calculi are seen although heavy renal vascular calcification is noted. Electronically Signed   By: Inez Catalina M.D.   On: 09/24/2015 13:15  09/24/2015  CLINICAL DATA:  Postop abdominal pain, history of recent colonic surgery EXAM: CT ABDOMEN AND PELVIS WITHOUT CONTRAST TECHNIQUE: Multidetector CT imaging of the abdomen and pelvis was performed following the standard protocol without IV contrast. COMPARISON:  04/26/2015. FINDINGS: Lung bases are free of acute infiltrate or sizable effusion. The liver, spleen, adrenal glands and pancreas are all normal in their CT appearance. The gallbladder is well distended and demonstrates multiple dependent gallstones. No definitive pericholecystic fluid is seen. Small amounts of free fluid are noted surrounding the liver and spleen. These changes are likely related to the recent colonic resection. Postsurgical changes are noted in the right colon consistent with the patient's given clinical history. No obstructive changes are seen. A tiny focus of air is noted adjacent to the suture line again likely related to the recent surgery. The anastomosis appears widely patent. Changes of prior sigmoid colectomy are also noted. No significant obstructive changes are noted. Aortic  stent graft is noted in satisfactory position. Comparison with prior exam shows a stable appearing aneurysm sac. Iliac calcifications are seen without aneurysmal dilatation. The bladder is  well distended. Prostate calcifications are seen. No significant lymphadenopathy is noted. The osseous structures show degenerative change of the lumbar spine. IMPRESSION: Small amounts of free fluid and free air likely related to the recent colonic surgery. No significant findings to suggest acute perforation are noted at this time. Multiple gallstones without definitive complicating factors. Electronically Signed: By: Inez Catalina M.D. On: 09/24/2015 12:29   Dg Abd 1 View  09/25/2015  CLINICAL DATA:  82 year old male with abdominal distension and pain. Recent colonic resection. EXAM: ABDOMEN - 1 VIEW COMPARISON:  09/24/2015 CT FINDINGS: Gaseous distension of the colon is noted. There is some oral contrast from recent CT present within the proximal colon. No dilated small bowel loops are noted. Surgical staples and clips are identified as well as an aortic stent graft. IMPRESSION: Gaseous colonic distention likely representing ileus. No dilated small bowel loops identified. Electronically Signed   By: Margarette Canada M.D.   On: 09/25/2015 10:53   US Renal  09/18/2015  CLINICAL DATA:  Acute renal failure. EXAM: RENAL / URINARY TRACT ULTRASOUND COMPLETE COMPARISON:  CT of the abdomen and pelvis 04/26/2015 FINDINGS: Right Kidney: Length: 11.1. Echogenicity within normal limits. No mass or hydronephrosis visualized. Left Kidney: Length: 11.7. Echogenicity within normal limits. No mass or hydronephrosis visualized. There is a questionable exophytic left lower pole renal mass measuring 1.9 x 1.4 x 1.4 cm. Bladder: Appears normal for degree of bladder distention. IMPRESSION: No evidence of hydronephrosis. Questionable exophytic left lower pole renal mass versus complicated cyst measuring 1.9 cm. Attention on follow-up is recommended.  Electronically Signed   By: Fidela Salisbury M.D.   On: 09/18/2015 10:27   Dg Chest Portable 1 View  09/24/2015  CLINICAL DATA:  Central line placement.  NG tube placement EXAM: PORTABLE CHEST 1 VIEW COMPARISON:  09/24/2015 FINDINGS: Right central line tip is in the SVC. No pneumothorax. NG tube enters the stomach. Heart is upper limits normal in size. Bibasilar opacities, likely atelectasis. Mild vascular congestion. No effusions or acute bony abnormality. IMPRESSION: Right central line tip in the SVC. No pneumothorax. NG tube enters the stomach. Mild vascular congestion and bibasilar atelectasis. Electronically Signed   By: Rolm Baptise M.D.   On: 09/24/2015 18:01   Dg Chest Port 1 View  09/24/2015  CLINICAL DATA:  Recent colon resection, epigastric pain, shortness of breath, last bowel movement yesterday, hypertension, diabetes mellitus, coronary artery disease post angioplasty, hyperlipidemia, GERD EXAM: PORTABLE CHEST 1 VIEW COMPARISON:  Portable exam 1000 hours compared to 04/14/2014 FINDINGS: Enlargement of cardiac silhouette. Atherosclerotic calcification aorta. Mediastinal contours and pulmonary vascularity normal for technique. Lungs clear. No pleural effusion or pneumothorax. Bones demineralized. IMPRESSION: Minimal enlargement of cardiac silhouette. No acute abnormalities. Electronically Signed   By: Lavonia Dana M.D.   On: 09/24/2015 10:25   Dg Abd Acute W/chest  09/27/2015  CLINICAL DATA:  Abdominal pain.  Recent partial colectomy EXAM: DG ABDOMEN ACUTE W/ 1V CHEST COMPARISON:  Two days ago FINDINGS: Normal heart size. Negative aortic and hilar contours. Right IJ central line, tip at the SVC level. COPD changes. There is no edema, consolidation, effusion, or pneumothorax. Unchanged gaseous distention of colon, greatest in the left abdomen. Gas and stool reaches the rectum. No pneumoperitoneum. No evidence of pneumatosis. Aortoiliac stenting. IMPRESSION: Unchanged gaseous distention of the  postoperative colon, likely adynamic ileus. Electronically Signed   By: Monte Fantasia M.D.   On: 09/27/2015 05:56     CBC  Recent Labs Lab 09/21/15 0511 09/24/15 0539  09/25/15 0224 09/26/15 0432 09/27/15 0450  WBC 7.0 18.3* 15.4* 17.2* 13.9*  HGB 10.5* 12.3* 10.0* 10.2* 9.5*  HCT 31.8* 37.1* 30.6* 31.2* 29.1*  PLT 205 489* 311 283 272  MCV 85.1 84.2 84.1 84.4 83.8  MCH 28.2 28.0 27.5 27.5 27.5  MCHC 33.1 33.3 32.6 32.6 32.8  RDW 15.1* 15.7* 16.1* 15.9* 15.6*  LYMPHSABS  --  1.1  --   --   --   MONOABS  --  0.7  --   --   --   EOSABS  --  0.0  --   --   --   BASOSABS  --  0.1  --   --   --     Chemistries   Recent Labs Lab 09/24/15 0947 09/25/15 0224 09/26/15 0432 09/27/15 0450  NA 137 136 136 138  K 3.9 4.4 4.7 3.9  CL 99* 105 106 105  CO2 22 21* 21* 24  GLUCOSE 166* 124* 116* 217*  BUN 29* 34* 33* 31*  CREATININE 2.15* 1.69* 1.30* 1.11  CALCIUM 9.0 7.9* 8.2* 7.8*  AST 36  --   --   --   ALT 20  --   --   --   ALKPHOS 55  --   --   --   BILITOT 1.3*  --   --   --    ------------------------------------------------------------------------------------------------------------------ estimated creatinine clearance is 53.2 mL/min (by C-G formula based on Cr of 1.11). ------------------------------------------------------------------------------------------------------------------ No results for input(s): HGBA1C in the last 72 hours. ------------------------------------------------------------------------------------------------------------------ No results for input(s): CHOL, HDL, LDLCALC, TRIG, CHOLHDL, LDLDIRECT in the last 72 hours. ------------------------------------------------------------------------------------------------------------------ No results for input(s): TSH, T4TOTAL, T3FREE, THYROIDAB in the last 72 hours.  Invalid input(s):  FREET3 ------------------------------------------------------------------------------------------------------------------ No results for input(s): VITAMINB12, FOLATE, FERRITIN, TIBC, IRON, RETICCTPCT in the last 72 hours.  Coagulation profile No results for input(s): INR, PROTIME in the last 168 hours.  No results for input(s): DDIMER in the last 72 hours.  Cardiac Enzymes  Recent Labs Lab 09/25/15 0224 09/25/15 0828 09/27/15 0450  TROPONINI 6.59* 6.77* 5.86*   ------------------------------------------------------------------------------------------------------------------ Invalid input(s): POCBNP    Assessment & Plan   82 year old male status post colon resection and postoperative inferior ST elevation MI who presents with abdominal pain which is now subsided but now has acute renal failure.  1. ventricular tachycardia: Repeat echocardiogram to see if there is any significant changes in his systolic function, magnesium has been sent I will send a TSH, cardiology to follow up on the patient  2. Acute renal failure: Possibly due to dehydration  Resolved with IV hydration   3. Abdominal pain: Status post recent colonic resection, surgery is following , abdominal pain persists noted to have ileus on the x-ray. Already on Maalox, I will place schedule simethicone see if that would help  5. Recent ST elevation MI: Patient has a drug-eluting stent in RCA. He has been off of Plavix for at least 2 days. Continue aspirin and Plavix. Seen by Dr. Fletcher Anon, troponin trending down continue to monitor  6. Hypotension: Now resolved  6. Type 2 diabetes: Hold by mouth medications. Continue sliding scale insulin and ADA diet.      Code Status Orders        Start     Ordered   09/24/15 1846  Full code   Continuous     09/24/15 1845    Code Status History    Date Active Date Inactive Code Status Order ID Comments User Context  09/17/2015  2:05 PM 09/21/2015  3:59 PM Full Code [id number]   Wellington Hampshire, MD Inpatient   09/14/2015  4:25 PM 09/17/2015  2:05 PM Full Code [id number]  Leonie Green, MD Inpatient    Advance Directive Documentation        Most Recent Value   Type of Advance Directive  Living will   Pre-existing out of facility DNR order (yellow form or pink MOST form)     "MOST" Form in Place?             Consults surgery cardiology DVT Prophylaxis  Lovenox   Lab Results  Component Value Date   PLT 272 09/27/2015     Time Spent in minutes    35 minutes of critical care time spent at high risk of cardiopulmonary arrest specially with his arrhythmia  Dustin Flock M.D on 09/27/2015 at 10:20 AM  Between 7am to 6pm - Pager - [phone]  After 6pm go to [email or web address] - password [email or web address] [facility]   Office  [phone]

## 2015-09-27 NOTE — Progress Notes (Addendum)
0400 pt moved to ICU 3 from 211 2C. Report given in transit from [place]. HR was 177 and patient was in VT when the telemetry clerk called to the floor. Pt had chest pain initially that had subsided after morphine on the floor, then magnesium, bicarb, lidocaine and amiodarone bolus given in unit. HR down to 90s to low 100s, still irregular.

## 2015-09-27 NOTE — Progress Notes (Signed)
CRITICAL VALUE ALERT  Critical value received:  Troponin 5.86  Date of notification:  09/27/2015  Time of notification:  0600  Critical value read back: yes  Nurse who received alert:  Bridgette Habermann RN  Time of first page:  0602  Responding MD:  Dr. Marcille Blanco  Time MD responded:  0602  Improved since last reading of 6.77

## 2015-09-27 NOTE — Progress Notes (Addendum)
eLink Physician-Brief Progress Note Patient Name: Nicholas Parker DOB: 04-18-1934 MRN: [id number]   Date of Service  09/27/2015  HPI/Events of Note  82 yo male with PMH of DM-Type II, recent colon resection and post op inferior STEMI s/p DES to RCA. Presents with Abdominal pain and renal failure. Abdominal CT Scan negative. ARF felt to be d/t dehydration. Patient started on Meropenem d/t ongoing abdominal pain and elevated WBC. Reported to have episode of VT this morning >> moved to ICU. Lidocaine IV X 1, Lidocaine IV infusion and Amiodarone IV infusion has been ordered. Current medical regimen includes ASA, Plavix, Reglan, Cyanocobalamin, Novolog SSI, Claritin, Protonix PO, Zocor. BP = 109/67 and HR = 80. O2 sat on 2.0 L/min [place] = 99% and RR = 21. Management per Hospitalist Service.   eICU Interventions  Continue present management.      Intervention Category Evaluation Type: New Patient Evaluation  Lysle Dingwall 09/27/2015, 5:06 AM

## 2015-09-27 NOTE — Progress Notes (Signed)
*  PRELIMINARY RESULTS* Echocardiogram 2D Echocardiogram has been performed.  Nicholas Parker 09/27/2015, 1:49 PM

## 2015-09-27 NOTE — Progress Notes (Addendum)
[facility] NOTE  Pharmacy Consult for Ceftriaxone Dosing (Day 1 - Day 2 of antibiotics) and electrolyte management     Allergies  Allergen Reactions  . Lipitor [Atorvastatin] Other (See Comments)    "leg cramps"  . Pravachol [Pravastatin Sodium] Other (See Comments)    "leg cramps"  . Riomet [Metformin Hcl] Diarrhea  . Vytorin [Ezetimibe-Simvastatin] Other (See Comments)    "leg cramps"    Patient Measurements: Height: '5\' 6"'$  (167.6 cm) Weight: 179 lb 0.2 oz (81.2 kg) IBW/kg (Calculated) : 63.8   Vital Signs: Temp: 98.3 F (36.8 C) (01/30 0745) Temp Source: Axillary (01/30 0745) BP: 100/59 mmHg (01/30 1200) Pulse Rate: 88 (01/30 1200) Intake/Output from previous day: 01/29 0701 - 01/30 0700 In: 699.2 [P.O.:360; I.V.:239.2; IV Piggyback:100] Out: 250 [Urine:250] Intake/Output from this shift: Total I/O In: 1118 [P.O.:240; I.V.:778; IV Piggyback:100] Out: 175 [Urine:175] Vent settings for last 24 hours:    Labs:  Recent Labs  09/25/15 0224 09/26/15 0432 09/27/15 0450  WBC 15.4* 17.2* 13.9*  HGB 10.0* 10.2* 9.5*  HCT 30.6* 31.2* 29.1*  PLT 311 283 272  CREATININE 1.69* 1.30* 1.11  MG  --   --  2.4   Estimated Creatinine Clearance: 53.2 mL/min (by C-G formula based on Cr of 1.11).   Recent Labs  09/27/15 0358 09/27/15 0733 09/27/15 1151  GLUCAP 135* 154* 140*    Microbiology: Recent Results (from the past 720 hour(s))  Surgical pcr screen     Status: None   Collection Time: 09/06/15  2:40 PM  Result Value Ref Range Status   MRSA, PCR NEGATIVE NEGATIVE Final   Staphylococcus aureus NEGATIVE NEGATIVE Final    Comment:        The Xpert SA Assay (FDA approved for NASAL specimens in patients over 81 years of age), is one component of a comprehensive surveillance program.  Test performance has been validated by [facility] for patients greater than or equal to 9 year old. It is not intended to diagnose infection nor  to guide or monitor treatment.   CULTURE, BLOOD (ROUTINE X 2) w Reflex to PCR ID Panel     Status: None (Preliminary result)   Collection Time: 09/26/15 11:34 AM  Result Value Ref Range Status   Specimen Description BLOOD LEFT HAND  Final   Special Requests   Final    BOTTLES DRAWN AEROBIC AND ANAEROBIC  AER 1CC ANA 3CC   Culture NO GROWTH < 24 HOURS  Final   Report Status PENDING  Incomplete  CULTURE, BLOOD (ROUTINE X 2) w Reflex to PCR ID Panel     Status: None (Preliminary result)   Collection Time: 09/26/15 11:35 AM  Result Value Ref Range Status   Specimen Description BLOOD LEFT ARM  Final   Special Requests BOTTLES DRAWN AEROBIC AND ANAEROBIC  10CC  Final   Culture NO GROWTH < 24 HOURS  Final   Report Status PENDING  Incomplete  MRSA PCR Screening     Status: None   Collection Time: 09/27/15  9:15 AM  Result Value Ref Range Status   MRSA by PCR NEGATIVE NEGATIVE Final    Comment:        The GeneXpert MRSA Assay (FDA approved for NASAL specimens only), is one component of a comprehensive MRSA colonization surveillance program. It is not intended to diagnose MRSA infection nor to guide or monitor treatment for MRSA infections.     Medications:  Scheduled:  . aspirin EC  81  mg Oral Daily  . clopidogrel  75 mg Oral Q breakfast  . cyanocobalamin  250 mcg Oral Daily  . insulin aspart  0-15 Units Subcutaneous TID WC  . insulin aspart  0-5 Units Subcutaneous QHS  . loratadine  10 mg Oral Daily  . meropenem (MERREM) IV  1 g Intravenous 3 times per day  . metoCLOPramide  5 mg Oral TID AC & HS  . pantoprazole  40 mg Oral Daily  . simethicone  80 mg Oral QID  . simvastatin  40 mg Oral q1800  . sodium chloride flush  3 mL Intravenous Q12H   Infusions:  . sodium chloride 125 mL/hr at 09/27/15 0540  . lidocaine 2 mg/min (09/27/15 0435)    Assessment: Pharmacy consulted for electrolyte management for 82 yo male admitted status post colon resection and postoperative  inferior ST elevation MI who presents with abdominal pain. Patient is currently on day 2 of meropenem 1g IV Q12hr.    Plan:  1. Ceftriaxone (Day 1 - Day 2 ABX): Will initiate patient on ceftriaxone 2g IV Q24hr.   2. Electrolytes are within normal limits. Will recheck electrolytes with am labs.    Pharmacy will continue to monitor and adjust per consult.    Simpson,Michael L 09/27/2015,12:40 PM

## 2015-09-27 NOTE — Progress Notes (Signed)
CCU called about patient's heart rhythm and rate of Vtach, heart rate in the 180's. Patient laying in bed awake, alert and oriented. Diaphoretic with some abdominal pain. No complaints of chest pain . Morphine was given for abdominal pain. Supervisor came to floor , Hiral RN came from ccu with monitor to check rhythm. Patient continued to run Vtach, heart rate elevated. Decision was made to transfer patient to CCU.  Nicholas Parker

## 2015-09-27 NOTE — Progress Notes (Signed)
Over the weekend he was started on meropenem and weaned off of his Levophed drip and moved out of the intensive care unit yesterday.  He was able to drink some clear liquids and ambulate in the hallway.  During the night he developed ventricular tachycardia and was moved back to the intensive care unit and started on a lidocaine drip.  He reports no abdominal pain this morning.  He has been passing some gas.  He had a small loose bowel movement yesterday.  He reports he tolerated the clear liquids well.  He reports no hunger this morning.  He reports no nausea or vomiting.  On examination he is awake alert and oriented.  Blood pressure 120/69, pulse rate 97, oxygen saturation 98%.  LUNGS: No respiratory distress.  Clear without wheezes rales or rhonchi.  HEART: Regular rhythm, normal S1-S2, without murmur.  ABDOMEN: Appears less distended.  His incision is healing satisfactorily.  There is minimal degree of tenderness.  White blood count 13,900, hemoglobin 9.5, creatinine 1.11, glucose 217  Impression: Postoperative ileus, myocardial infarction, ventricular tachycardia, improvement in cardiogenic shock, resolution of acute renal failure  Plan is to start a full liquid carbohydrate control diet.  Work towards additional ambulation in the hallway.

## 2015-09-28 LAB — CBC WITH DIFFERENTIAL/PLATELET
Basophils Absolute: 0 10*3/uL (ref 0–0.1)
Basophils Relative: 0 %
EOS ABS: 0.1 10*3/uL (ref 0–0.7)
EOS PCT: 1 %
HCT: 29.6 % — ABNORMAL LOW (ref 40.0–52.0)
HEMOGLOBIN: 9.6 g/dL — AB (ref 13.0–18.0)
LYMPHS ABS: 0.4 10*3/uL — AB (ref 1.0–3.6)
Lymphocytes Relative: 3 %
MCH: 27.5 pg (ref 26.0–34.0)
MCHC: 32.3 g/dL (ref 32.0–36.0)
MCV: 85 fL (ref 80.0–100.0)
MONOS PCT: 5 %
Monocytes Absolute: 0.6 10*3/uL (ref 0.2–1.0)
Neutro Abs: 10.7 10*3/uL — ABNORMAL HIGH (ref 1.4–6.5)
Neutrophils Relative %: 91 %
Platelets: 298 10*3/uL (ref 150–440)
RBC: 3.48 MIL/uL — AB (ref 4.40–5.90)
RDW: 15.8 % — ABNORMAL HIGH (ref 11.5–14.5)
WBC: 11.8 10*3/uL — ABNORMAL HIGH (ref 3.8–10.6)

## 2015-09-28 LAB — BASIC METABOLIC PANEL
Anion gap: 6 (ref 5–15)
BUN: 22 mg/dL — AB (ref 6–20)
CALCIUM: 7.5 mg/dL — AB (ref 8.9–10.3)
CHLORIDE: 103 mmol/L (ref 101–111)
CO2: 24 mmol/L (ref 22–32)
CREATININE: 0.87 mg/dL (ref 0.61–1.24)
GFR calc Af Amer: >60 mL/min (ref >60)
GFR calc non Af Amer: >60 mL/min (ref >60)
GLUCOSE: 148 mg/dL — AB (ref 65–99)
Potassium: 4.3 mmol/L (ref 3.5–5.1)
Sodium: 133 mmol/L — ABNORMAL LOW (ref 135–145)

## 2015-09-28 LAB — GLUCOSE, CAPILLARY
GLUCOSE-CAPILLARY: 129 mg/dL — AB (ref 65–99)
GLUCOSE-CAPILLARY: 152 mg/dL — AB (ref 65–99)
Glucose-Capillary: 135 mg/dL — ABNORMAL HIGH (ref 65–99)
Glucose-Capillary: 176 mg/dL — ABNORMAL HIGH (ref 65–99)

## 2015-09-28 MED ORDER — AMIODARONE HCL 200 MG PO TABS
400.0000 mg | ORAL_TABLET | Freq: Two times a day (BID) | ORAL | Status: DC
  Administered 2015-09-28 – 2015-10-01 (×7): 400 mg via ORAL
  Filled 2015-09-28 (×7): qty 2

## 2015-09-28 MED ORDER — GLUCERNA PO LIQD
237.0000 mL | Freq: Three times a day (TID) | ORAL | Status: DC
  Administered 2015-09-29 – 2015-10-03 (×8): 237 mL via ORAL

## 2015-09-28 MED ORDER — ENOXAPARIN SODIUM 40 MG/0.4ML ~~LOC~~ SOLN
40.0000 mg | SUBCUTANEOUS | Status: DC
  Administered 2015-09-28 – 2015-10-03 (×6): 40 mg via SUBCUTANEOUS
  Filled 2015-09-28 (×6): qty 0.4

## 2015-09-28 MED ORDER — SODIUM CHLORIDE 0.9 % IV SOLN
INTRAVENOUS | Status: DC
  Administered 2015-09-28 (×2): via INTRAVENOUS

## 2015-09-28 NOTE — Consult Note (Signed)
Reason for Consult: sustained ventricular tachycardia Referring Physician:  Dr. [name],  Dr. Benjie Karvonen hospitalist  Nicholas Parker is an 82 y.o. male.  HPI:  ADL white male known coronary disease diabetes hypertension recent STEMI PCI and stent postop from of gallbladder surgery. Patient has had complications hypotension acute renal insufficiency.   Hypertension postop. Patient has significant acute on chronic renal sufficiency complains of weakness and fatigue and persistent recurrent abdominal discomfort. The patient had an episode of sustained ventricular tachycardia transferred to intensive care unit placed on amiodarone and subsequently lidocaine drip IV. Patient is had no further significant ectopy denies any chest pain still complains of weakness and fatigue.  Past Medical History  Diagnosis Date  . Hypertension   . Diabetes mellitus without complication ([place])   . Hyperlipidemia   . Coronary artery disease   . neuropathy   . Low testosterone   . Neuropathy ([place])   . Anemia   . B12 deficiency   . GERD (gastroesophageal reflux disease)   . Skin cancer     Basal Cell  . Adenocarcinoma ([place])   . AAA (abdominal aortic aneurysm) ([place])   . Diverticulosis     Past Surgical History  Procedure Laterality Date  . Arthrosopic rotator cuff repair Left   . Robotic colectomy    . Coronary angioplasty    . Vascular surgery    . Colonoscopy with propofol N/A 04/13/2015    Procedure: COLONOSCOPY WITH PROPOFOL;  Surgeon: Lollie Sails, MD;  Location: [facility] ENDOSCOPY;  Service: Endoscopy;  Laterality: N/A;  . Colonoscopy with propofol N/A 04/14/2015    Procedure: COLONOSCOPY WITH PROPOFOL;  Surgeon: Lollie Sails, MD;  Location: [facility] ENDOSCOPY;  Service: Endoscopy;  Laterality: N/A;  . Colonoscopy with propofol N/A 07/20/2015    Procedure: COLONOSCOPY WITH PROPOFOL;  Surgeon: Lollie Sails, MD;  Location: [facility] ENDOSCOPY;  Service: Endoscopy;  Laterality: N/A;  . Colon surgery    . Eye  surgery Bilateral     Cataract Extraction with IOL  . Abdominal aortic aneurysm repair  2014    Dr. Delana Meyer, [facility]  . Laparoscopic right colectomy Right 09/14/2015    Procedure: LAPAROSCOPIC RIGHT COLECTOMY  CONVERTED TO OPEN RIGHT COLECTOMY, LYSIS OF ADHESIONS;  Surgeon: Leonie Green, MD;  Location: [facility] ORS;  Service: General;  Laterality: Right;  . Cardiac catheterization N/A 09/17/2015    Procedure: Left Heart Cath and Coronary Angiography;  Surgeon: Wellington Hampshire, MD;  Location: [place] CV LAB;  Service: Cardiovascular;  Laterality: N/A;  . Cardiac catheterization N/A 09/17/2015    Procedure: Coronary Stent Intervention;  Surgeon: Wellington Hampshire, MD;  Location: [place] CV LAB;  Service: Cardiovascular;  Laterality: N/A;    No family history on file.  Social History:  reports that he quit smoking about 11 years ago. His smoking use included Cigarettes. He smoked 1.50 packs per day. He has never used smokeless tobacco. He reports that he drinks about 0.6 oz of alcohol per week. He reports that he does not use illicit drugs.  Allergies:  Allergies  Allergen Reactions  . Lipitor [Atorvastatin] Other (See Comments)    "leg cramps"  . Pravachol [Pravastatin Sodium] Other (See Comments)    "leg cramps"  . Riomet [Metformin Hcl] Diarrhea  . Vytorin [Ezetimibe-Simvastatin] Other (See Comments)    "leg cramps"    Medications: I have reviewed the patient's current medications.  Results for orders placed or performed during the hospital encounter of 09/24/15 (from the  past 48 hour(s))  Glucose, capillary     Status: Abnormal   Collection Time: 09/26/15  4:53 PM  Result Value Ref Range   Glucose-Capillary 125 (H) 65 - 99 mg/dL  Glucose, capillary     Status: Abnormal   Collection Time: 09/26/15  9:17 PM  Result Value Ref Range   Glucose-Capillary 137 (H) 65 - 99 mg/dL   Comment 1 Notify RN   Glucose, capillary     Status: Abnormal   Collection Time: 09/27/15  3:58  AM  Result Value Ref Range   Glucose-Capillary 135 (H) 65 - 99 mg/dL  CBC     Status: Abnormal   Collection Time: 09/27/15  4:50 AM  Result Value Ref Range   WBC 13.9 (H) 3.8 - 10.6 K/uL   RBC 3.47 (L) 4.40 - 5.90 MIL/uL   Hemoglobin 9.5 (L) 13.0 - 18.0 g/dL   HCT 29.1 (L) 40.0 - 52.0 %   MCV 83.8 80.0 - 100.0 fL   MCH 27.5 26.0 - 34.0 pg   MCHC 32.8 32.0 - 36.0 g/dL   RDW 15.6 (H) 11.5 - 14.5 %   Platelets 272 150 - 440 K/uL  Basic metabolic panel     Status: Abnormal   Collection Time: 09/27/15  4:50 AM  Result Value Ref Range   Sodium 138 135 - 145 mmol/L   Potassium 3.9 3.5 - 5.1 mmol/L   Chloride 105 101 - 111 mmol/L   CO2 24 22 - 32 mmol/L   Glucose, Bld 217 (H) 65 - 99 mg/dL   BUN 31 (H) 6 - 20 mg/dL   Creatinine, Ser 1.11 0.61 - 1.24 mg/dL   Calcium 7.8 (L) 8.9 - 10.3 mg/dL   GFR calc non Af Amer >60 >60 mL/min   GFR calc Af Amer >60 >60 mL/min    Comment: (NOTE) The eGFR has been calculated using the CKD EPI equation. This calculation has not been validated in all clinical situations. eGFR's persistently <60 mL/min signify possible Chronic Kidney Disease.    Anion gap 9 5 - 15  Troponin I     Status: Abnormal   Collection Time: 09/27/15  4:50 AM  Result Value Ref Range   Troponin I 5.86 (H) <0.031 ng/mL    Comment: READ BACK AND VERIFIED WITH FELICIA PREUDHOMME AT 6122 09/27/15.PMH        POSSIBLE MYOCARDIAL ISCHEMIA. SERIAL TESTING RECOMMENDED.   Magnesium     Status: None   Collection Time: 09/27/15  4:50 AM  Result Value Ref Range   Magnesium 2.4 1.7 - 2.4 mg/dL  TSH     Status: None   Collection Time: 09/27/15  4:50 AM  Result Value Ref Range   TSH 3.246 0.350 - 4.500 uIU/mL  Glucose, capillary     Status: Abnormal   Collection Time: 09/27/15  7:33 AM  Result Value Ref Range   Glucose-Capillary 154 (H) 65 - 99 mg/dL  MRSA PCR Screening     Status: None   Collection Time: 09/27/15  9:15 AM  Result Value Ref Range   MRSA by PCR NEGATIVE NEGATIVE     Comment:        The GeneXpert MRSA Assay (FDA approved for NASAL specimens only), is one component of a comprehensive MRSA colonization surveillance program. It is not intended to diagnose MRSA infection nor to guide or monitor treatment for MRSA infections.   Glucose, capillary     Status: Abnormal   Collection Time: 09/27/15 11:51 AM  Result Value Ref Range   Glucose-Capillary 140 (H) 65 - 99 mg/dL  Glucose, capillary     Status: Abnormal   Collection Time: 09/27/15  4:35 PM  Result Value Ref Range   Glucose-Capillary 151 (H) 65 - 99 mg/dL  Glucose, capillary     Status: Abnormal   Collection Time: 09/27/15  9:20 PM  Result Value Ref Range   Glucose-Capillary 127 (H) 65 - 99 mg/dL  Basic metabolic panel     Status: Abnormal   Collection Time: 09/28/15  4:34 AM  Result Value Ref Range   Sodium 133 (L) 135 - 145 mmol/L   Potassium 4.3 3.5 - 5.1 mmol/L   Chloride 103 101 - 111 mmol/L   CO2 24 22 - 32 mmol/L   Glucose, Bld 148 (H) 65 - 99 mg/dL   BUN 22 (H) 6 - 20 mg/dL   Creatinine, Ser 0.87 0.61 - 1.24 mg/dL   Calcium 7.5 (L) 8.9 - 10.3 mg/dL   GFR calc non Af Amer >60 >60 mL/min   GFR calc Af Amer >60 >60 mL/min    Comment: (NOTE) The eGFR has been calculated using the CKD EPI equation. This calculation has not been validated in all clinical situations. eGFR's persistently <60 mL/min signify possible Chronic Kidney Disease.    Anion gap 6 5 - 15  CBC with Differential/Platelet     Status: Abnormal   Collection Time: 09/28/15  4:34 AM  Result Value Ref Range   WBC 11.8 (H) 3.8 - 10.6 K/uL   RBC 3.48 (L) 4.40 - 5.90 MIL/uL   Hemoglobin 9.6 (L) 13.0 - 18.0 g/dL   HCT 29.6 (L) 40.0 - 52.0 %   MCV 85.0 80.0 - 100.0 fL   MCH 27.5 26.0 - 34.0 pg   MCHC 32.3 32.0 - 36.0 g/dL   RDW 15.8 (H) 11.5 - 14.5 %   Platelets 298 150 - 440 K/uL   Neutrophils Relative % 91 %   Neutro Abs 10.7 (H) 1.4 - 6.5 K/uL   Lymphocytes Relative 3 %   Lymphs Abs 0.4 (L) 1.0 - 3.6 K/uL    Monocytes Relative 5 %   Monocytes Absolute 0.6 0.2 - 1.0 K/uL   Eosinophils Relative 1 %   Eosinophils Absolute 0.1 0 - 0.7 K/uL   Basophils Relative 0 %   Basophils Absolute 0.0 0 - 0.1 K/uL  Glucose, capillary     Status: Abnormal   Collection Time: 09/28/15  7:14 AM  Result Value Ref Range   Glucose-Capillary 135 (H) 65 - 99 mg/dL  Glucose, capillary     Status: Abnormal   Collection Time: 09/28/15 11:23 AM  Result Value Ref Range   Glucose-Capillary 176 (H) 65 - 99 mg/dL    Dg Abd Acute W/chest  09/27/2015  CLINICAL DATA:  Abdominal pain.  Recent partial colectomy EXAM: DG ABDOMEN ACUTE W/ 1V CHEST COMPARISON:  Two days ago FINDINGS: Normal heart size. Negative aortic and hilar contours. Right IJ central line, tip at the SVC level. COPD changes. There is no edema, consolidation, effusion, or pneumothorax. Unchanged gaseous distention of colon, greatest in the left abdomen. Gas and stool reaches the rectum. No pneumoperitoneum. No evidence of pneumatosis. Aortoiliac stenting. IMPRESSION: Unchanged gaseous distention of the postoperative colon, likely adynamic ileus. Electronically Signed   By: Monte Fantasia M.D.   On: 09/27/2015 05:56    Review of Systems  Constitutional: Positive for malaise/fatigue.  HENT: Positive for congestion.   Eyes: Negative.  Respiratory: Positive for shortness of breath.   Cardiovascular: Positive for palpitations, orthopnea and leg swelling.  Gastrointestinal: Negative.   Genitourinary: Negative.   Musculoskeletal: Negative.   Skin: Negative.   Neurological: Positive for weakness.  Endo/Heme/Allergies: Negative.   Psychiatric/Behavioral: Negative.    Blood pressure 117/63, pulse 90, temperature 98.2 F (36.8 C), temperature source Oral, resp. rate 29, height 5' 6"  (1.676 m), weight 81.2 kg (179 lb 0.2 oz), SpO2 100 %. Physical Exam  Nursing note and vitals reviewed. Constitutional: He appears well-developed and well-nourished.  HENT:   Head: Normocephalic and atraumatic.  Right Ear: External ear normal.  Eyes: Conjunctivae and EOM are normal. Pupils are equal, round, and reactive to light.  Neck: Normal range of motion. Neck supple.    Assessment/Plan: Ventricular tachycardia sustained  ischemic cardiomyopathy  hypotension  congestive heart failure  status post STEMI PCI and stent RCA  postop gallbladder surgery  abdominal pain  GERD  diabetes type 2 uncomplicated  hyperlipidemia  acute on chronic renal insufficiency . PLAN  will discontinue lidocaine IV  switch to amiodarone loading dose p.o. 400 twice a day  would recommend LifeVest short-term  patient will require AICD defibrillator  continue Plavix and aspirin post PCI stent  continue simvastatin therapy for lipid management  agree with Protonix for reflux  continue DVT prophylaxis with Lovenox  insulin therapy for diabetes management  beta-blockers when blood pressure is able to tolerate  continue inpatient therapy and convalescence from abdominal pain  CALLWOOD,DWAYNE D. 09/28/2015, 4:08 PM

## 2015-09-28 NOTE — Progress Notes (Signed)
[facility] for Ceftriaxone Dosing (Day 2 - Day 3 of antibiotics) and electrolyte management     Allergies  Allergen Reactions  . Lipitor [Atorvastatin] Other (See Comments)    "leg cramps"  . Pravachol [Pravastatin Sodium] Other (See Comments)    "leg cramps"  . Riomet [Metformin Hcl] Diarrhea  . Vytorin [Ezetimibe-Simvastatin] Other (See Comments)    "leg cramps"    Patient Measurements: Height: '5\' 6"'$  (167.6 cm) Weight: 179 lb 0.2 oz (81.2 kg) IBW/kg (Calculated) : 63.8   Vital Signs: Temp: 98.2 F (36.8 C) (01/31 0800) Temp Source: Oral (01/31 0800) BP: 125/65 mmHg (01/31 1200) Pulse Rate: 86 (01/31 1100) Intake/Output from previous day: 01/30 0701 - 01/31 0700 In: 3235 [P.O.:240; I.V.:933; IV Piggyback:150] Out: 625 [Urine:625] Intake/Output from this shift: Total I/O In: 228 [I.V.:228] Out: 100 [Urine:100] Vent settings for last 24 hours:    Labs:  Recent Labs  09/26/15 0432 09/27/15 0450 09/28/15 0434  WBC 17.2* 13.9* 11.8*  HGB 10.2* 9.5* 9.6*  HCT 31.2* 29.1* 29.6*  PLT 283 272 298  CREATININE 1.30* 1.11 0.87  MG  --  2.4  --    Estimated Creatinine Clearance: 67.8 mL/min (by C-G formula based on Cr of 0.87).   Recent Labs  09/27/15 2120 09/28/15 0714 09/28/15 1123  GLUCAP 127* 135* 176*    Microbiology: Recent Results (from the past 720 hour(s))  Surgical pcr screen     Status: None   Collection Time: 09/06/15  2:40 PM  Result Value Ref Range Status   MRSA, PCR NEGATIVE NEGATIVE Final   Staphylococcus aureus NEGATIVE NEGATIVE Final    Comment:        The Xpert SA Assay (FDA approved for NASAL specimens in patients over 3 years of age), is one component of a comprehensive surveillance program.  Test performance has been validated by [facility] for patients greater than or equal to 42 year old. It is not intended to diagnose infection nor to guide or monitor treatment.   CULTURE,  BLOOD (ROUTINE X 2) w Reflex to PCR ID Panel     Status: None (Preliminary result)   Collection Time: 09/26/15 11:34 AM  Result Value Ref Range Status   Specimen Description BLOOD LEFT HAND  Final   Special Requests   Final    BOTTLES DRAWN AEROBIC AND ANAEROBIC  AER 1CC ANA 3CC   Culture NO GROWTH 2 DAYS  Final   Report Status PENDING  Incomplete  CULTURE, BLOOD (ROUTINE X 2) w Reflex to PCR ID Panel     Status: None (Preliminary result)   Collection Time: 09/26/15 11:35 AM  Result Value Ref Range Status   Specimen Description BLOOD LEFT ARM  Final   Special Requests BOTTLES DRAWN AEROBIC AND ANAEROBIC  10CC  Final   Culture NO GROWTH 2 DAYS  Final   Report Status PENDING  Incomplete  MRSA PCR Screening     Status: None   Collection Time: 09/27/15  9:15 AM  Result Value Ref Range Status   MRSA by PCR NEGATIVE NEGATIVE Final    Comment:        The GeneXpert MRSA Assay (FDA approved for NASAL specimens only), is one component of a comprehensive MRSA colonization surveillance program. It is not intended to diagnose MRSA infection nor to guide or monitor treatment for MRSA infections.     Medications:  Scheduled:  . aspirin EC  81 mg Oral Daily  .  cefTRIAXone (ROCEPHIN)  IV  2 g Intravenous Q24H  . clopidogrel  75 mg Oral Q breakfast  . cyanocobalamin  250 mcg Oral Daily  . enoxaparin (LOVENOX) injection  40 mg Subcutaneous Q24H  . insulin aspart  0-15 Units Subcutaneous TID WC  . insulin aspart  0-5 Units Subcutaneous QHS  . loratadine  10 mg Oral Daily  . metoCLOPramide  5 mg Oral TID AC & HS  . pantoprazole  40 mg Oral Daily  . senna-docusate  1 tablet Oral BID  . simethicone  80 mg Oral QID  . simvastatin  40 mg Oral q1800  . sodium chloride flush  3 mL Intravenous Q12H   Infusions:  . sodium chloride 75 mL/hr at 09/28/15 0900  . lidocaine 2 mg/min (09/27/15 2253)    Assessment: Pharmacy consulted for electrolyte management for 82 yo male admitted status post  colon resection and postoperative inferior ST elevation MI who presents with abdominal pain. Patient is currently on day 2 of meropenem 1g IV Q12hr.    Plan:  1. Will continue patient on ceftriaxone 2g IV Q24hr.   2. Electrolytes are within normal limits. Will recheck electrolytes with am labs.    Pharmacy will continue to monitor and adjust per consult.    Ulice Dash D 09/28/2015,12:20 PM

## 2015-09-28 NOTE — Progress Notes (Signed)
He reports he had a large bowel movement and passed a large amount of gas today. He has been tolerating his full liquid diet but did not take separate tray because it was 1 hour late. He has not yet walked in the hallway.  On examination he is awake alert and oriented. Abdomen is minimally distended. There are some mild tenderness in the right lower quadrant no guarding.  Impression: Improvement in ileus  Plan is to offer Glucerna as nutritional supplement, encourage walking in the hallway.

## 2015-09-28 NOTE — Progress Notes (Signed)
He had a large bowel movement and passing gas with his fleets enema last night. He tolerated his full liquid diet fairly well yesterday. He reports minimal abdominal discomfort this morning.  He is awake alert and oriented. Abdomen appears to be mildly distended and it is soft with less tenderness in the right lower quadrant than yesterday. There were some significant bowel sounds detected during palpation of the abdomen suggesting persistent excess colonic gas.  White blood count is 11,800 trending down  Impression colonic ileus  Plan is to give another fleets enema. Continue with full liquid diet. Resume walking in the hallway once cardiac condition permits

## 2015-09-28 NOTE — Progress Notes (Signed)
Patient admitted to floor. Wife at bedside. No c/o pain. Telemetry verified. Tolerating 02 at 2l. Lungs diminished. Generalized edema arms, abdomen to legs bilaterally.

## 2015-09-28 NOTE — Progress Notes (Signed)
[place] at [facility]                                                                                                                                                                                            Patient Demographics   Nicholas Parker, is a 82 y.o. male, DOB - 1934-03-05, ZOX:[id number]  Admit date - 09/24/2015   Admitting Physician Bettey Costa, MD  Outpatient Primary MD for the patient is Nicholas III, MD   LOS - 4  Subjective: No further ventricular tachycardia overnight. Abdominal pain improved after Fleet enema past LAD a gas.  Review of Systems:   CONSTITUTIONAL: No documented fever. No fatigue, weakness. No weight gain, no weight loss.  EYES: No blurry or double vision.  ENT: No tinnitus. No postnasal drip. No redness of the oropharynx.  RESPIRATORY: No cough, no wheeze, no hemoptysis. No dyspnea.  CARDIOVASCULAR: No chest pain. No orthopnea. No palpitations. No syncope.  GASTROINTESTINAL: No nausea, no vomiting or diarrhea. Improved abdominal pain. No melena or hematochezia.  GENITOURINARY: No dysuria or hematuria.  ENDOCRINE: No polyuria or nocturia. No heat or cold intolerance.  HEMATOLOGY: No anemia. No bruising. No bleeding.  INTEGUMENTARY: No rashes. No lesions.  MUSCULOSKELETAL: No arthritis. No swelling. No gout.  NEUROLOGIC: No numbness, tingling, or ataxia. No seizure-type activity.  PSYCHIATRIC: No anxiety. No insomnia. No ADD.    Vitals:   Filed Vitals:   09/28/15 0800 09/28/15 0900 09/28/15 1000 09/28/15 1100  BP: 122/72 104/59 111/61 117/66  Pulse: 88 94 87 86  Temp: 98.2 F (36.8 C)     TempSrc: Oral     Resp: 31 35 22 21  Height:      Weight:      SpO2: 98% 100% 97% 98%    Wt Readings from Last 3 Encounters:  09/24/15 81.2 kg (179 lb 0.2 oz)  09/18/15 84.823 kg (187 lb)  09/06/15 81.647 kg (180 lb)     Intake/Output Summary (Last 24 hours) at 09/28/15 1214 Last data filed at 09/28/15 1100   Gross per 24 hour  Intake    433 ml  Output    550 ml  Net   -117 ml    Physical Exam:   GENERAL: Pleasant-appearing in no apparent distress.  HEAD, EYES, EARS, NOSE AND THROAT: Atraumatic, normocephalic. Extraocular muscles are intact. Pupils equal and reactive to light. Sclerae anicteric. No conjunctival injection. No oro-pharyngeal erythema.  NECK: Supple. There is no jugular venous distention. No bruits, no lymphadenopathy, no thyromegaly.  HEART: Regular rate and rhythm,. No murmurs, no rubs, no clicks.  LUNGS:  Clear to auscultation bilaterally. No rales or rhonchi. No wheezes.  ABDOMEN: Distended, diminished bowel sounds there is no rebound tenderness or guarding EXTREMITIES: No evidence of any cyanosis, clubbing, or positive peripheral edema.  +2 pedal and radial pulses bilaterally.  NEUROLOGIC: The patient is alert, awake, and oriented x3 with no focal motor or sensory deficits appreciated bilaterally.  SKIN: Moist and warm with no rashes appreciated.  Psych: Not anxious, depressed LN: No inguinal LN enlargement    Antibiotics   Anti-infectives    Start     Dose/Rate Route Frequency Ordered Stop   09/27/15 2200  meropenem (MERREM) 1 g in sodium chloride 0.9 % 100 mL IVPB  Status:  Discontinued     1 g 200 mL/hr over 30 Minutes Intravenous 3 times per day 09/27/15 1239 09/27/15 1412   09/27/15 1800  cefTRIAXone (ROCEPHIN) 2 g in dextrose 5 % 50 mL IVPB     2 g 100 mL/hr over 30 Minutes Intravenous Every 24 hours 09/27/15 1449     09/26/15 1400  meropenem (MERREM) 1 g in sodium chloride 0.9 % 100 mL IVPB  Status:  Discontinued     1 g 200 mL/hr over 30 Minutes Intravenous 3 times per day 09/26/15 1124 09/26/15 1126   09/26/15 1130  meropenem (MERREM) 1 g in sodium chloride 0.9 % 100 mL IVPB  Status:  Discontinued     1 g 200 mL/hr over 30 Minutes Intravenous Every 12 hours 09/26/15 1126 09/27/15 1239      Medications   Scheduled Meds: . aspirin EC  81 mg Oral Daily   . cefTRIAXone (ROCEPHIN)  IV  2 g Intravenous Q24H  . clopidogrel  75 mg Oral Q breakfast  . cyanocobalamin  250 mcg Oral Daily  . enoxaparin (LOVENOX) injection  40 mg Subcutaneous Q24H  . insulin aspart  0-15 Units Subcutaneous TID WC  . insulin aspart  0-5 Units Subcutaneous QHS  . loratadine  10 mg Oral Daily  . metoCLOPramide  5 mg Oral TID AC & HS  . pantoprazole  40 mg Oral Daily  . senna-docusate  1 tablet Oral BID  . simethicone  80 mg Oral QID  . simvastatin  40 mg Oral q1800  . sodium chloride flush  3 mL Intravenous Q12H   Continuous Infusions: . sodium chloride 75 mL/hr at 09/28/15 0900  . lidocaine 2 mg/min (09/27/15 2253)   PRN Meds:.acetaminophen **OR** acetaminophen, alum & mag hydroxide-simeth, morphine injection, ondansetron **OR** ondansetron (ZOFRAN) IV, oxyCODONE-acetaminophen, phenol, sodium chloride   Data Review:   Micro Results Recent Results (from the past 240 hour(s))  CULTURE, BLOOD (ROUTINE X 2) w Reflex to PCR ID Panel     Status: None (Preliminary result)   Collection Time: 09/26/15 11:34 AM  Result Value Ref Range Status   Specimen Description BLOOD LEFT HAND  Final   Special Requests   Final    BOTTLES DRAWN AEROBIC AND ANAEROBIC  AER 1CC ANA 3CC   Culture NO GROWTH 2 DAYS  Final   Report Status PENDING  Incomplete  CULTURE, BLOOD (ROUTINE X 2) w Reflex to PCR ID Panel     Status: None (Preliminary result)   Collection Time: 09/26/15 11:35 AM  Result Value Ref Range Status   Specimen Description BLOOD LEFT ARM  Final   Special Requests BOTTLES DRAWN AEROBIC AND ANAEROBIC  10CC  Final   Culture NO GROWTH 2 DAYS  Final   Report Status PENDING  Incomplete  MRSA PCR  Screening     Status: None   Collection Time: 09/27/15  9:15 AM  Result Value Ref Range Status   MRSA by PCR NEGATIVE NEGATIVE Final    Comment:        The GeneXpert MRSA Assay (FDA approved for NASAL specimens only), is one component of a comprehensive MRSA  colonization surveillance program. It is not intended to diagnose MRSA infection nor to guide or monitor treatment for MRSA infections.     Radiology Reports Ct Abdomen Pelvis Wo Contrast  09/24/2015  ADDENDUM REPORT: 09/24/2015 13:15 ADDENDUM: It should be noted the kidneys are well visualized and demonstrate no definitive obstructive change. No renal calculi are seen although heavy renal vascular calcification is noted. Electronically Signed   By: Inez Catalina M.D.   On: 09/24/2015 13:15  09/24/2015  CLINICAL DATA:  Postop abdominal pain, history of recent colonic surgery EXAM: CT ABDOMEN AND PELVIS WITHOUT CONTRAST TECHNIQUE: Multidetector CT imaging of the abdomen and pelvis was performed following the standard protocol without IV contrast. COMPARISON:  04/26/2015. FINDINGS: Lung bases are free of acute infiltrate or sizable effusion. The liver, spleen, adrenal glands and pancreas are all normal in their CT appearance. The gallbladder is well distended and demonstrates multiple dependent gallstones. No definitive pericholecystic fluid is seen. Small amounts of free fluid are noted surrounding the liver and spleen. These changes are likely related to the recent colonic resection. Postsurgical changes are noted in the right colon consistent with the patient's given clinical history. No obstructive changes are seen. A tiny focus of air is noted adjacent to the suture line again likely related to the recent surgery. The anastomosis appears widely patent. Changes of prior sigmoid colectomy are also noted. No significant obstructive changes are noted. Aortic stent graft is noted in satisfactory position. Comparison with prior exam shows a stable appearing aneurysm sac. Iliac calcifications are seen without aneurysmal dilatation. The bladder is well distended. Prostate calcifications are seen. No significant lymphadenopathy is noted. The osseous structures show degenerative change of the lumbar spine.  IMPRESSION: Small amounts of free fluid and free air likely related to the recent colonic surgery. No significant findings to suggest acute perforation are noted at this time. Multiple gallstones without definitive complicating factors. Electronically Signed: By: Inez Catalina M.D. On: 09/24/2015 12:29   Dg Abd 1 View  09/25/2015  CLINICAL DATA:  82 year old male with abdominal distension and pain. Recent colonic resection. EXAM: ABDOMEN - 1 VIEW COMPARISON:  09/24/2015 CT FINDINGS: Gaseous distension of the colon is noted. There is some oral contrast from recent CT present within the proximal colon. No dilated small bowel loops are noted. Surgical staples and clips are identified as well as an aortic stent graft. IMPRESSION: Gaseous colonic distention likely representing ileus. No dilated small bowel loops identified. Electronically Signed   By: Margarette Canada M.D.   On: 09/25/2015 10:53   US Renal  09/18/2015  CLINICAL DATA:  Acute renal failure. EXAM: RENAL / URINARY TRACT ULTRASOUND COMPLETE COMPARISON:  CT of the abdomen and pelvis 04/26/2015 FINDINGS: Right Kidney: Length: 11.1. Echogenicity within normal limits. No mass or hydronephrosis visualized. Left Kidney: Length: 11.7. Echogenicity within normal limits. No mass or hydronephrosis visualized. There is a questionable exophytic left lower pole renal mass measuring 1.9 x 1.4 x 1.4 cm. Bladder: Appears normal for degree of bladder distention. IMPRESSION: No evidence of hydronephrosis. Questionable exophytic left lower pole renal mass versus complicated cyst measuring 1.9 cm. Attention on follow-up is recommended. Electronically Signed  By: Fidela Salisbury M.D.   On: 09/18/2015 10:27   Dg Chest Portable 1 View  09/24/2015  CLINICAL DATA:  Central line placement.  NG tube placement EXAM: PORTABLE CHEST 1 VIEW COMPARISON:  09/24/2015 FINDINGS: Right central line tip is in the SVC. No pneumothorax. NG tube enters the stomach. Heart is upper limits  normal in size. Bibasilar opacities, likely atelectasis. Mild vascular congestion. No effusions or acute bony abnormality. IMPRESSION: Right central line tip in the SVC. No pneumothorax. NG tube enters the stomach. Mild vascular congestion and bibasilar atelectasis. Electronically Signed   By: Rolm Baptise M.D.   On: 09/24/2015 18:01   Dg Chest Port 1 View  09/24/2015  CLINICAL DATA:  Recent colon resection, epigastric pain, shortness of breath, last bowel movement yesterday, hypertension, diabetes mellitus, coronary artery disease post angioplasty, hyperlipidemia, GERD EXAM: PORTABLE CHEST 1 VIEW COMPARISON:  Portable exam 1000 hours compared to 04/14/2014 FINDINGS: Enlargement of cardiac silhouette. Atherosclerotic calcification aorta. Mediastinal contours and pulmonary vascularity normal for technique. Lungs clear. No pleural effusion or pneumothorax. Bones demineralized. IMPRESSION: Minimal enlargement of cardiac silhouette. No acute abnormalities. Electronically Signed   By: Lavonia Dana M.D.   On: 09/24/2015 10:25   Dg Abd Acute W/chest  09/27/2015  CLINICAL DATA:  Abdominal pain.  Recent partial colectomy EXAM: DG ABDOMEN ACUTE W/ 1V CHEST COMPARISON:  Two days ago FINDINGS: Normal heart size. Negative aortic and hilar contours. Right IJ central line, tip at the SVC level. COPD changes. There is no edema, consolidation, effusion, or pneumothorax. Unchanged gaseous distention of colon, greatest in the left abdomen. Gas and stool reaches the rectum. No pneumoperitoneum. No evidence of pneumatosis. Aortoiliac stenting. IMPRESSION: Unchanged gaseous distention of the postoperative colon, likely adynamic ileus. Electronically Signed   By: Monte Fantasia M.D.   On: 09/27/2015 05:56     CBC  Recent Labs Lab 09/24/15 0947 09/25/15 0224 09/26/15 0432 09/27/15 0450 09/28/15 0434  WBC 18.3* 15.4* 17.2* 13.9* 11.8*  HGB 12.3* 10.0* 10.2* 9.5* 9.6*  HCT 37.1* 30.6* 31.2* 29.1* 29.6*  PLT 489* 311 283  272 298  MCV 84.2 84.1 84.4 83.8 85.0  MCH 28.0 27.5 27.5 27.5 27.5  MCHC 33.3 32.6 32.6 32.8 32.3  RDW 15.7* 16.1* 15.9* 15.6* 15.8*  LYMPHSABS 1.1  --   --   --  0.4*  MONOABS 0.7  --   --   --  0.6  EOSABS 0.0  --   --   --  0.1  BASOSABS 0.1  --   --   --  0.0    Chemistries   Recent Labs Lab 09/24/15 0947 09/25/15 0224 09/26/15 0432 09/27/15 0450 09/28/15 0434  NA 137 136 136 138 133*  K 3.9 4.4 4.7 3.9 4.3  CL 99* 105 106 105 103  CO2 22 21* 21* 24 24  GLUCOSE 166* 124* 116* 217* 148*  BUN 29* 34* 33* 31* 22*  CREATININE 2.15* 1.69* 1.30* 1.11 0.87  CALCIUM 9.0 7.9* 8.2* 7.8* 7.5*  MG  --   --   --  2.4  --   AST 36  --   --   --   --   ALT 20  --   --   --   --   ALKPHOS 55  --   --   --   --   BILITOT 1.3*  --   --   --   --    ------------------------------------------------------------------------------------------------------------------ estimated creatinine clearance is 67.8  mL/min (by C-G formula based on Cr of 0.87). ------------------------------------------------------------------------------------------------------------------ No results for input(s): HGBA1C in the last 72 hours. ------------------------------------------------------------------------------------------------------------------ No results for input(s): CHOL, HDL, LDLCALC, TRIG, CHOLHDL, LDLDIRECT in the last 72 hours. ------------------------------------------------------------------------------------------------------------------  Recent Labs  09/27/15 0450  TSH 3.246   ------------------------------------------------------------------------------------------------------------------ No results for input(s): VITAMINB12, FOLATE, FERRITIN, TIBC, IRON, RETICCTPCT in the last 72 hours.  Coagulation profile No results for input(s): INR, PROTIME in the last 168 hours.  No results for input(s): DDIMER in the last 72 hours.  Cardiac Enzymes  Recent Labs Lab 09/25/15 0224  09/25/15 0828 09/27/15 0450  TROPONINI 6.59* 6.77* 5.86*   ------------------------------------------------------------------------------------------------------------------ Invalid input(s): POCBNP    Assessment & Plan   82 year old male status post colon resection and postoperative inferior ST elevation MI who presents with abdominal pain which is now subsided but now has acute renal failure.  1. ventricular tachycardia: Repeat echocardiogram mild decrease in EF , waiting cardiology follow-up  2. Acute renal failure: Possibly due to dehydration  Resolved with IV hydration   3. Abdominal pain: Status post recent colonic resection, surgery is following , abdominal pain improved with Fleet enema  5. Recent ST elevation MI: Patient has a drug-eluting stent in RCA. He has been off of Plavix for at least 2 days. Continue aspirin and Plavix. Awaiting follow-up from Dr. Towanda Malkin  6. Hypotension: Now resolved  6. Type 2 diabetes: Hold by mouth medications. Continue sliding scale insulin and ADA diet.      Code Status Orders        Start     Ordered   09/24/15 1846  Full code   Continuous     09/24/15 1845    Code Status History    Date Active Date Inactive Code Status Order ID Comments User Context   09/17/2015  2:05 PM 09/21/2015  3:59 PM Full Code [id number]  Wellington Hampshire, MD Inpatient   09/14/2015  4:25 PM 09/17/2015  2:05 PM Full Code [id number]  Leonie Green, MD Inpatient    Advance Directive Documentation        Most Recent Value   Type of Advance Directive  Living will   Pre-existing out of facility DNR order (yellow form or pink MOST form)     "MOST" Form in Place?             Consults surgery cardiology DVT Prophylaxis  Lovenox   Lab Results  Component Value Date   PLT 298 09/28/2015     Time Spent in minutes 32 minutes spent     Dustin Flock M.D on 09/28/2015 at 12:14 PM  Between 7am to 6pm - Pager - [phone]  After 6pm go to  [email or web address] - password [email or web address] [place] [facility]   Office  [phone]

## 2015-09-29 ENCOUNTER — Inpatient Hospital Stay (HOSPITAL_COMMUNITY): Payer: Commercial Managed Care - HMO

## 2015-09-29 ENCOUNTER — Inpatient Hospital Stay: Payer: Commercial Managed Care - HMO

## 2015-09-29 LAB — CBC
HEMATOCRIT: 28.6 % — AB (ref 40.0–52.0)
HEMOGLOBIN: 9.5 g/dL — AB (ref 13.0–18.0)
MCH: 27.7 pg (ref 26.0–34.0)
MCHC: 33.1 g/dL (ref 32.0–36.0)
MCV: 83.7 fL (ref 80.0–100.0)
Platelets: 296 10*3/uL (ref 150–440)
RBC: 3.42 MIL/uL — ABNORMAL LOW (ref 4.40–5.90)
RDW: 15.4 % — AB (ref 11.5–14.5)
WBC: 10.9 10*3/uL — AB (ref 3.8–10.6)

## 2015-09-29 LAB — PROTIME-INR
INR: 1.28
PROTHROMBIN TIME: 16.1 s — AB (ref 11.4–15.0)

## 2015-09-29 LAB — PHOSPHORUS: PHOSPHORUS: 2.6 mg/dL (ref 2.5–4.6)

## 2015-09-29 LAB — BASIC METABOLIC PANEL
ANION GAP: 6 (ref 5–15)
BUN: 19 mg/dL (ref 6–20)
CHLORIDE: 106 mmol/L (ref 101–111)
CO2: 27 mmol/L (ref 22–32)
CREATININE: 0.78 mg/dL (ref 0.61–1.24)
Calcium: 8 mg/dL — ABNORMAL LOW (ref 8.9–10.3)
GFR calc non Af Amer: >60 mL/min (ref >60)
Glucose, Bld: 130 mg/dL — ABNORMAL HIGH (ref 65–99)
Potassium: 3.8 mmol/L (ref 3.5–5.1)
SODIUM: 139 mmol/L (ref 135–145)

## 2015-09-29 LAB — GLUCOSE, CAPILLARY
GLUCOSE-CAPILLARY: 108 mg/dL — AB (ref 65–99)
GLUCOSE-CAPILLARY: 129 mg/dL — AB (ref 65–99)
GLUCOSE-CAPILLARY: 130 mg/dL — AB (ref 65–99)
GLUCOSE-CAPILLARY: 142 mg/dL — AB (ref 65–99)

## 2015-09-29 LAB — APTT: aPTT: 41 seconds — ABNORMAL HIGH (ref 24–36)

## 2015-09-29 LAB — MAGNESIUM: MAGNESIUM: 2 mg/dL (ref 1.7–2.4)

## 2015-09-29 MED ORDER — FUROSEMIDE 10 MG/ML IJ SOLN
20.0000 mg | Freq: Two times a day (BID) | INTRAMUSCULAR | Status: DC
  Administered 2015-09-29 – 2015-10-02 (×7): 20 mg via INTRAVENOUS
  Filled 2015-09-29 (×7): qty 2

## 2015-09-29 MED ORDER — FENTANYL CITRATE (PF) 100 MCG/2ML IJ SOLN
INTRAMUSCULAR | Status: AC
  Filled 2015-09-29: qty 2

## 2015-09-29 MED ORDER — MIDAZOLAM HCL 5 MG/5ML IJ SOLN
INTRAMUSCULAR | Status: AC
  Filled 2015-09-29: qty 5

## 2015-09-29 MED ORDER — SODIUM CHLORIDE 0.9 % IV SOLN
1.0000 g | Freq: Three times a day (TID) | INTRAVENOUS | Status: DC
  Administered 2015-09-29 – 2015-10-04 (×15): 1 g via INTRAVENOUS
  Filled 2015-09-29 (×18): qty 1

## 2015-09-29 MED ORDER — IOHEXOL 240 MG/ML SOLN
25.0000 mL | INTRAMUSCULAR | Status: AC
  Administered 2015-09-29 (×2): 25 mL via ORAL

## 2015-09-29 MED ORDER — SODIUM CHLORIDE 0.9 % IV SOLN
INTRAVENOUS | Status: DC
  Administered 2015-09-29: 15:00:00 via INTRAVENOUS

## 2015-09-29 NOTE — Progress Notes (Signed)
I reviewed his CT images which demonstrate a collection of fluid in the right abdomen.  There are some associated air bubbles.  This appears suspicious for intra-abdominal abscess.  I recommended CT-guided catheter drainage and did discuss this with the radiologist, Dr Sharlee Blew, and with Dr. Posey Pronto he does still have right lower quadrant tenderness.  I discussed this with the patient and family.  Will order pre-time and PTT

## 2015-09-29 NOTE — Progress Notes (Signed)
Pt up to chair with wife at bedside. Refusing chair alarm while - educated on safety. Belongings within reach.

## 2015-09-29 NOTE — Care Management Important Message (Signed)
Important Message  Patient Details  Name: Nicholas Parker MRN: [id number] Date of Birth: 1934-08-16   Medicare Important Message Given:  Yes    Juliann Pulse A Halona Amstutz 09/29/2015, 11:18 AM

## 2015-09-29 NOTE — Progress Notes (Signed)
[place] at [facility]                                                                                                                                                                                            Patient Demographics   Nicholas Parker, is a 82 y.o. male, DOB - 01-Sep-1933, PPJ:[id number]  Admit date - 09/24/2015   Admitting Physician Bettey Costa, MD  Outpatient Primary MD for the patient is BERT Briscoe Burns III, MD   LOS - 5  Subjective: Patient still having some abdominal pain and distention. Denies any nausea or vomiting  Review of Systems:   CONSTITUTIONAL: No documented fever. No fatigue, weakness. No weight gain, no weight loss.  EYES: No blurry or double vision.  ENT: No tinnitus. No postnasal drip. No redness of the oropharynx.  RESPIRATORY: No cough, no wheeze, no hemoptysis. Positive dyspnea.  CARDIOVASCULAR: No chest pain. No orthopnea. No palpitations. No syncope.  GASTROINTESTINAL:  Persistent abdominal pain GENITOURINARY: No dysuria or hematuria.  ENDOCRINE: No polyuria or nocturia. No heat or cold intolerance.  HEMATOLOGY: No anemia. No bruising. No bleeding.  INTEGUMENTARY: No rashes. No lesions.  MUSCULOSKELETAL: No arthritis. No swelling. No gout.  NEUROLOGIC: No numbness, tingling, or ataxia. No seizure-type activity.  PSYCHIATRIC: No anxiety. No insomnia. No ADD.    Vitals:   Filed Vitals:   09/28/15 1600 09/28/15 1752 09/28/15 2133 09/29/15 0501  BP: 117/63 118/64 112/57 121/59  Pulse:  85 84 78  Temp:  99 F (37.2 C) 98.7 F (37.1 C) 98.4 F (36.9 C)  TempSrc:  Oral Oral Oral  Resp: '29 20 19 19  '$ Height:      Weight:      SpO2: 100% 100% 100% 100%    Wt Readings from Last 3 Encounters:  09/24/15 81.2 kg (179 lb 0.2 oz)  09/18/15 84.823 kg (187 lb)  09/06/15 81.647 kg (180 lb)     Intake/Output Summary (Last 24 hours) at 09/29/15 1006 Last data filed at 09/29/15 0730  Gross per 24 hour  Intake 1373.5  ml  Output    500 ml  Net  873.5 ml    Physical Exam:   GENERAL: Pleasant-appearing in no apparent distress.  HEAD, EYES, EARS, NOSE AND THROAT: Atraumatic, normocephalic. Extraocular muscles are intact. Pupils equal and reactive to light. Sclerae anicteric. No conjunctival injection. No oro-pharyngeal erythema.  NECK: Supple. There is no jugular venous distention. No bruits, no lymphadenopathy, no thyromegaly.  HEART: Regular rate and rhythm,. No murmurs, no rubs, no clicks.  LUNGS: Clear to auscultation bilaterally. No rales or rhonchi. No wheezes.  ABDOMEN: Distended, diminished bowel sounds there is no rebound tenderness or guarding EXTREMITIES: No evidence of any cyanosis, clubbing, or positive peripheral edema.  +2 pedal and radial pulses bilaterally. 1+ edema NEUROLOGIC: The patient is alert, awake, and oriented x3 with no focal motor or sensory deficits appreciated bilaterally.  SKIN: Moist and warm with no rashes appreciated.  Psych: Not anxious, depressed LN: No inguinal LN enlargement    Antibiotics   Anti-infectives    Start     Dose/Rate Route Frequency Ordered Stop   09/27/15 2200  meropenem (MERREM) 1 g in sodium chloride 0.9 % 100 mL IVPB  Status:  Discontinued     1 g 200 mL/hr over 30 Minutes Intravenous 3 times per day 09/27/15 1239 09/27/15 1412   09/27/15 1800  cefTRIAXone (ROCEPHIN) 2 g in dextrose 5 % 50 mL IVPB     2 g 100 mL/hr over 30 Minutes Intravenous Every 24 hours 09/27/15 1449     09/26/15 1400  meropenem (MERREM) 1 g in sodium chloride 0.9 % 100 mL IVPB  Status:  Discontinued     1 g 200 mL/hr over 30 Minutes Intravenous 3 times per day 09/26/15 1124 09/26/15 1126   09/26/15 1130  meropenem (MERREM) 1 g in sodium chloride 0.9 % 100 mL IVPB  Status:  Discontinued     1 g 200 mL/hr over 30 Minutes Intravenous Every 12 hours 09/26/15 1126 09/27/15 1239      Medications   Scheduled Meds: . amiodarone  400 mg Oral BID  . aspirin EC  81 mg Oral  Daily  . cefTRIAXone (ROCEPHIN)  IV  2 g Intravenous Q24H  . clopidogrel  75 mg Oral Q breakfast  . cyanocobalamin  250 mcg Oral Daily  . enoxaparin (LOVENOX) injection  40 mg Subcutaneous Q24H  . furosemide  20 mg Intravenous Q12H  . GLUCERNA  237 mL Oral TID  . insulin aspart  0-15 Units Subcutaneous TID WC  . insulin aspart  0-5 Units Subcutaneous QHS  . loratadine  10 mg Oral Daily  . metoCLOPramide  5 mg Oral TID AC & HS  . pantoprazole  40 mg Oral Daily  . senna-docusate  1 tablet Oral BID  . simethicone  80 mg Oral QID  . simvastatin  40 mg Oral q1800  . sodium chloride flush  3 mL Intravenous Q12H   Continuous Infusions: . lidocaine Stopped (09/28/15 1537)   PRN Meds:.acetaminophen **OR** acetaminophen, alum & mag hydroxide-simeth, morphine injection, ondansetron **OR** ondansetron (ZOFRAN) IV, oxyCODONE-acetaminophen, phenol, sodium chloride   Data Review:   Micro Results Recent Results (from the past 240 hour(s))  CULTURE, BLOOD (ROUTINE X 2) w Reflex to PCR ID Panel     Status: None (Preliminary result)   Collection Time: 09/26/15 11:34 AM  Result Value Ref Range Status   Specimen Description BLOOD LEFT HAND  Final   Special Requests   Final    BOTTLES DRAWN AEROBIC AND ANAEROBIC  AER 1CC ANA 3CC   Culture NO GROWTH 2 DAYS  Final   Report Status PENDING  Incomplete  CULTURE, BLOOD (ROUTINE X 2) w Reflex to PCR ID Panel     Status: None (Preliminary result)   Collection Time: 09/26/15 11:35 AM  Result Value Ref Range Status   Specimen Description BLOOD LEFT ARM  Final   Special Requests BOTTLES DRAWN AEROBIC AND ANAEROBIC  10CC  Final   Culture NO GROWTH 2 DAYS  Final   Report Status PENDING  Incomplete  MRSA PCR Screening     Status: None   Collection Time: 09/27/15  9:15 AM  Result Value Ref Range Status   MRSA by PCR NEGATIVE NEGATIVE Final    Comment:        The GeneXpert MRSA Assay (FDA approved for NASAL specimens only), is one component of  a comprehensive MRSA colonization surveillance program. It is not intended to diagnose MRSA infection nor to guide or monitor treatment for MRSA infections.     Radiology Reports Ct Abdomen Pelvis Wo Contrast  09/24/2015  ADDENDUM REPORT: 09/24/2015 13:15 ADDENDUM: It should be noted the kidneys are well visualized and demonstrate no definitive obstructive change. No renal calculi are seen although heavy renal vascular calcification is noted. Electronically Signed   By: Inez Catalina M.D.   On: 09/24/2015 13:15  09/24/2015  CLINICAL DATA:  Postop abdominal pain, history of recent colonic surgery EXAM: CT ABDOMEN AND PELVIS WITHOUT CONTRAST TECHNIQUE: Multidetector CT imaging of the abdomen and pelvis was performed following the standard protocol without IV contrast. COMPARISON:  04/26/2015. FINDINGS: Lung bases are free of acute infiltrate or sizable effusion. The liver, spleen, adrenal glands and pancreas are all normal in their CT appearance. The gallbladder is well distended and demonstrates multiple dependent gallstones. No definitive pericholecystic fluid is seen. Small amounts of free fluid are noted surrounding the liver and spleen. These changes are likely related to the recent colonic resection. Postsurgical changes are noted in the right colon consistent with the patient's given clinical history. No obstructive changes are seen. A tiny focus of air is noted adjacent to the suture line again likely related to the recent surgery. The anastomosis appears widely patent. Changes of prior sigmoid colectomy are also noted. No significant obstructive changes are noted. Aortic stent graft is noted in satisfactory position. Comparison with prior exam shows a stable appearing aneurysm sac. Iliac calcifications are seen without aneurysmal dilatation. The bladder is well distended. Prostate calcifications are seen. No significant lymphadenopathy is noted. The osseous structures show degenerative change of  the lumbar spine. IMPRESSION: Small amounts of free fluid and free air likely related to the recent colonic surgery. No significant findings to suggest acute perforation are noted at this time. Multiple gallstones without definitive complicating factors. Electronically Signed: By: Inez Catalina M.D. On: 09/24/2015 12:29   Dg Abd 1 View  09/25/2015  CLINICAL DATA:  82 year old male with abdominal distension and pain. Recent colonic resection. EXAM: ABDOMEN - 1 VIEW COMPARISON:  09/24/2015 CT FINDINGS: Gaseous distension of the colon is noted. There is some oral contrast from recent CT present within the proximal colon. No dilated small bowel loops are noted. Surgical staples and clips are identified as well as an aortic stent graft. IMPRESSION: Gaseous colonic distention likely representing ileus. No dilated small bowel loops identified. Electronically Signed   By: Margarette Canada M.D.   On: 09/25/2015 10:53   US Renal  09/18/2015  CLINICAL DATA:  Acute renal failure. EXAM: RENAL / URINARY TRACT ULTRASOUND COMPLETE COMPARISON:  CT of the abdomen and pelvis 04/26/2015 FINDINGS: Right Kidney: Length: 11.1. Echogenicity within normal limits. No mass or hydronephrosis visualized. Left Kidney: Length: 11.7. Echogenicity within normal limits. No mass or hydronephrosis visualized. There is a questionable exophytic left lower pole renal mass measuring 1.9 x 1.4 x 1.4 cm. Bladder: Appears normal for degree of bladder distention. IMPRESSION: No evidence of hydronephrosis. Questionable exophytic left lower pole renal mass versus complicated cyst measuring 1.9 cm. Attention on follow-up  is recommended. Electronically Signed   By: Fidela Salisbury M.D.   On: 09/18/2015 10:27   Dg Chest Portable 1 View  09/24/2015  CLINICAL DATA:  Central line placement.  NG tube placement EXAM: PORTABLE CHEST 1 VIEW COMPARISON:  09/24/2015 FINDINGS: Right central line tip is in the SVC. No pneumothorax. NG tube enters the stomach. Heart is  upper limits normal in size. Bibasilar opacities, likely atelectasis. Mild vascular congestion. No effusions or acute bony abnormality. IMPRESSION: Right central line tip in the SVC. No pneumothorax. NG tube enters the stomach. Mild vascular congestion and bibasilar atelectasis. Electronically Signed   By: Rolm Baptise M.D.   On: 09/24/2015 18:01   Dg Chest Port 1 View  09/24/2015  CLINICAL DATA:  Recent colon resection, epigastric pain, shortness of breath, last bowel movement yesterday, hypertension, diabetes mellitus, coronary artery disease post angioplasty, hyperlipidemia, GERD EXAM: PORTABLE CHEST 1 VIEW COMPARISON:  Portable exam 1000 hours compared to 04/14/2014 FINDINGS: Enlargement of cardiac silhouette. Atherosclerotic calcification aorta. Mediastinal contours and pulmonary vascularity normal for technique. Lungs clear. No pleural effusion or pneumothorax. Bones demineralized. IMPRESSION: Minimal enlargement of cardiac silhouette. No acute abnormalities. Electronically Signed   By: Lavonia Dana M.D.   On: 09/24/2015 10:25   Dg Abd Acute W/chest  09/27/2015  CLINICAL DATA:  Abdominal pain.  Recent partial colectomy EXAM: DG ABDOMEN ACUTE W/ 1V CHEST COMPARISON:  Two days ago FINDINGS: Normal heart size. Negative aortic and hilar contours. Right IJ central line, tip at the SVC level. COPD changes. There is no edema, consolidation, effusion, or pneumothorax. Unchanged gaseous distention of colon, greatest in the left abdomen. Gas and stool reaches the rectum. No pneumoperitoneum. No evidence of pneumatosis. Aortoiliac stenting. IMPRESSION: Unchanged gaseous distention of the postoperative colon, likely adynamic ileus. Electronically Signed   By: Monte Fantasia M.D.   On: 09/27/2015 05:56     CBC  Recent Labs Lab 09/24/15 0947 09/25/15 0224 09/26/15 0432 09/27/15 0450 09/28/15 0434 09/29/15 0500  WBC 18.3* 15.4* 17.2* 13.9* 11.8* 10.9*  HGB 12.3* 10.0* 10.2* 9.5* 9.6* 9.5*  HCT 37.1*  30.6* 31.2* 29.1* 29.6* 28.6*  PLT 489* 311 283 272 298 296  MCV 84.2 84.1 84.4 83.8 85.0 83.7  MCH 28.0 27.5 27.5 27.5 27.5 27.7  MCHC 33.3 32.6 32.6 32.8 32.3 33.1  RDW 15.7* 16.1* 15.9* 15.6* 15.8* 15.4*  LYMPHSABS 1.1  --   --   --  0.4*  --   MONOABS 0.7  --   --   --  0.6  --   EOSABS 0.0  --   --   --  0.1  --   BASOSABS 0.1  --   --   --  0.0  --     Chemistries   Recent Labs Lab 09/24/15 0947 09/25/15 0224 09/26/15 0432 09/27/15 0450 09/28/15 0434 09/29/15 0500  NA 137 136 136 138 133* 139  K 3.9 4.4 4.7 3.9 4.3 3.8  CL 99* 105 106 105 103 106  CO2 22 21* 21* '24 24 27  '$ GLUCOSE 166* 124* 116* 217* 148* 130*  BUN 29* 34* 33* 31* 22* 19  CREATININE 2.15* 1.69* 1.30* 1.11 0.87 0.78  CALCIUM 9.0 7.9* 8.2* 7.8* 7.5* 8.0*  MG  --   --   --  2.4  --  2.0  AST 36  --   --   --   --   --   ALT 20  --   --   --   --   --  ALKPHOS 55  --   --   --   --   --   BILITOT 1.3*  --   --   --   --   --    ------------------------------------------------------------------------------------------------------------------ estimated creatinine clearance is 73.8 mL/min (by C-G formula based on Cr of 0.78). ------------------------------------------------------------------------------------------------------------------ No results for input(s): HGBA1C in the last 72 hours. ------------------------------------------------------------------------------------------------------------------ No results for input(s): CHOL, HDL, LDLCALC, TRIG, CHOLHDL, LDLDIRECT in the last 72 hours. ------------------------------------------------------------------------------------------------------------------  Recent Labs  09/27/15 0450  TSH 3.246   ------------------------------------------------------------------------------------------------------------------ No results for input(s): VITAMINB12, FOLATE, FERRITIN, TIBC, IRON, RETICCTPCT in the last 72 hours.  Coagulation profile No results for  input(s): INR, PROTIME in the last 168 hours.  No results for input(s): DDIMER in the last 72 hours.  Cardiac Enzymes  Recent Labs Lab 09/25/15 0224 09/25/15 0828 09/27/15 0450  TROPONINI 6.59* 6.77* 5.86*   ------------------------------------------------------------------------------------------------------------------ Invalid input(s): POCBNP    Assessment & Plan   82 year old male status post colon resection and postoperative inferior ST elevation MI who presents with abdominal pain which is now subsided but now has acute renal failure.  1. ventricular tachycardia: Repeat echocardiogram mild decrease in EF seen by cardiology on amiodarone now orally no further arrhythmia  2. Shortness of breath suspect due to fluid overload with acute systolic CHF I will treat him with low-dose IV lace a  3. Abdominal pain: Status post recent colonic resection, surgery is following , CT of the abdomen today   5. Recent ST elevation MI: Patient has a drug-eluting stent in RCA. He has been off of Plavix for at least 2 days. Continue aspirin and Plavix. Awaiting follow-up from Dr. Towanda Malkin  6. Hypotension: Now resolved  6. Type 2 diabetes: Let's sugar slightly elevated continue sliding scale insulin    Code Status Orders        Start     Ordered   09/24/15 1846  Full code   Continuous     09/24/15 1845    Code Status History    Date Active Date Inactive Code Status Order ID Comments User Context   09/17/2015  2:05 PM 09/21/2015  3:59 PM Full Code [id number]  Wellington Hampshire, MD Inpatient   09/14/2015  4:25 PM 09/17/2015  2:05 PM Full Code [id number]  Leonie Green, MD Inpatient    Advance Directive Documentation        Most Recent Value   Type of Advance Directive  Living will   Pre-existing out of facility DNR order (yellow form or pink MOST form)     "MOST" Form in Place?             Consults surgery cardiology DVT Prophylaxis  Lovenox   Lab Results  Component  Value Date   PLT 296 09/29/2015     Time Spent in minutes 32 minutes spent     Dustin Flock M.D on 09/29/2015 at 10:06 AM  Between 7am to 6pm - Pager - [phone]  After 6pm go to [email or web address] - password [email or web address] [facility]   Office  [phone]

## 2015-09-29 NOTE — Progress Notes (Signed)
Dr. Tamala Julian, surgery, rounding on floor now. Informed nurse that patient may need intervention based on CT results given to him by radiologist. Instructed to leave central line in place.

## 2015-09-29 NOTE — Progress Notes (Signed)
Nadine? Lab tech notified this RN of 2 lab techs unsuccessfully sticking patient for PT/PTT/INR. Instructed this RN to draw from central line.

## 2015-09-29 NOTE — Progress Notes (Addendum)
Pt rested quietly today with wife at bedside. Pain managed by PO oxy. No other complaints. JP drain placed to RLQ this afternoon - draining yellow/cloudy fluid.  SR 80's today.

## 2015-09-29 NOTE — Progress Notes (Signed)
Dr. Posey Pronto aware that patient still has IJ triple lumen. Instructed to leave it in and let MD assess - he will let me know if he decides he wants it taken out today.

## 2015-09-29 NOTE — Progress Notes (Signed)
Remove IJ triple lumen per Dr. Posey Pronto. Pt just got back from CT and wants to eat lunch first. Will remove afterwards.

## 2015-09-29 NOTE — Progress Notes (Signed)
PT Cancellation Note  Patient Details Name: Nicholas Parker MRN: [id number] DOB: 1934-08-05   Cancelled Treatment:    Reason Eval/Treat Not Completed: Other (comment)  Pt currently in bathroom and pt's wife reports that he will be a while.  Nursing reports pt going to procedure soon.  Will re attempt PT eval at a later date and time.  [name], SPT [name] 09/29/2015, 3:20 PM

## 2015-09-29 NOTE — Progress Notes (Signed)
His pro time and PTT were mildly prolonged.  He has had successful insertion of CT-guided catheter.  The catheter was inserted into a collection of fluid in the right mid abdomen.  There is successful drainage of a turbid serous fluid.  I emptied the suction container 50 cc and reactivated.  Some fluid has been sent to the lab for culture.  Impression abdominal fluid collection, culture pending  Continue intravenous antibiotics.  Encourage walking.  Continuing carbohydrate controlled full liquids

## 2015-09-29 NOTE — Progress Notes (Signed)
[place] for Ceftriaxone Dosing (Day 3 - Day 4 of antibiotics) and electrolyte management     Allergies  Allergen Reactions  . Lipitor [Atorvastatin] Other (See Comments)    "leg cramps"  . Pravachol [Pravastatin Sodium] Other (See Comments)    "leg cramps"  . Riomet [Metformin Hcl] Diarrhea  . Vytorin [Ezetimibe-Simvastatin] Other (See Comments)    "leg cramps"    Patient Measurements: Height: '5\' 6"'$  (167.6 cm) Weight: 179 lb 0.2 oz (81.2 kg) IBW/kg (Calculated) : 63.8   Vital Signs: Temp: 98.4 F (36.9 C) (02/01 0501) Temp Source: Oral (02/01 0501) BP: 121/59 mmHg (02/01 0501) Pulse Rate: 78 (02/01 0501) Intake/Output from previous day: 01/31 0701 - 02/01 0700 In: 1451.5 [I.V.:1401.5; IV Piggyback:50] Out: 375 [Urine:375] Intake/Output from this shift:   Vent settings for last 24 hours:    Labs:  Recent Labs  09/27/15 0450 09/28/15 0434 09/29/15 0500  WBC 13.9* 11.8* 10.9*  HGB 9.5* 9.6* 9.5*  HCT 29.1* 29.6* 28.6*  PLT 272 298 296  CREATININE 1.11 0.87 0.78  MG 2.4  --  2.0  PHOS  --   --  2.6   Estimated Creatinine Clearance: 73.8 mL/min (by C-G formula based on Cr of 0.78).   Recent Labs  09/28/15 1123 09/28/15 1621 09/28/15 2055  GLUCAP 176* 129* 152*    Microbiology: Recent Results (from the past 720 hour(s))  Surgical pcr screen     Status: None   Collection Time: 09/06/15  2:40 PM  Result Value Ref Range Status   MRSA, PCR NEGATIVE NEGATIVE Final   Staphylococcus aureus NEGATIVE NEGATIVE Final    Comment:        The Xpert SA Assay (FDA approved for NASAL specimens in patients over 27 years of age), is one component of a comprehensive surveillance program.  Test performance has been validated by [facility] for patients greater than or equal to 23 year old. It is not intended to diagnose infection nor to guide or monitor treatment.   CULTURE, BLOOD (ROUTINE X 2) w Reflex to PCR ID  Panel     Status: None (Preliminary result)   Collection Time: 09/26/15 11:34 AM  Result Value Ref Range Status   Specimen Description BLOOD LEFT HAND  Final   Special Requests   Final    BOTTLES DRAWN AEROBIC AND ANAEROBIC  AER 1CC ANA 3CC   Culture NO GROWTH 2 DAYS  Final   Report Status PENDING  Incomplete  CULTURE, BLOOD (ROUTINE X 2) w Reflex to PCR ID Panel     Status: None (Preliminary result)   Collection Time: 09/26/15 11:35 AM  Result Value Ref Range Status   Specimen Description BLOOD LEFT ARM  Final   Special Requests BOTTLES DRAWN AEROBIC AND ANAEROBIC  10CC  Final   Culture NO GROWTH 2 DAYS  Final   Report Status PENDING  Incomplete  MRSA PCR Screening     Status: None   Collection Time: 09/27/15  9:15 AM  Result Value Ref Range Status   MRSA by PCR NEGATIVE NEGATIVE Final    Comment:        The GeneXpert MRSA Assay (FDA approved for NASAL specimens only), is one component of a comprehensive MRSA colonization surveillance program. It is not intended to diagnose MRSA infection nor to guide or monitor treatment for MRSA infections.     Medications:  Scheduled:  . amiodarone  400 mg Oral BID  . aspirin  EC  81 mg Oral Daily  . cefTRIAXone (ROCEPHIN)  IV  2 g Intravenous Q24H  . clopidogrel  75 mg Oral Q breakfast  . cyanocobalamin  250 mcg Oral Daily  . enoxaparin (LOVENOX) injection  40 mg Subcutaneous Q24H  . GLUCERNA  237 mL Oral TID  . insulin aspart  0-15 Units Subcutaneous TID WC  . insulin aspart  0-5 Units Subcutaneous QHS  . loratadine  10 mg Oral Daily  . metoCLOPramide  5 mg Oral TID AC & HS  . pantoprazole  40 mg Oral Daily  . senna-docusate  1 tablet Oral BID  . simethicone  80 mg Oral QID  . simvastatin  40 mg Oral q1800  . sodium chloride flush  3 mL Intravenous Q12H   Infusions:  . sodium chloride 75 mL/hr at 09/28/15 2135  . lidocaine Stopped (09/28/15 1537)    Assessment: Pharmacy consulted for electrolyte management for 82 yo male  admitted status post colon resection and postoperative inferior ST elevation MI who presents with abdominal pain.   Plan:  1. Will continue patient on ceftriaxone 2g IV Q24hr.   2. Electrolytes are within normal limits. Will recheck electrolytes with am labs.    Pharmacy will continue to monitor and adjust per consult.     Roe Coombs, PharmD Pharmacy Resident 09/29/2015

## 2015-09-29 NOTE — Procedures (Signed)
CT guided abscess drain in right abdomen.  Complications:  None  Blood Loss: none  See dictation in canopy pacs

## 2015-09-29 NOTE — Care Management Note (Signed)
Case Management Note  Patient Details  Name: Nicholas Parker MRN: [id number] Date of Birth: 18-Apr-1934  Subjective/Objective:   Continues to have right lower quadrant abdominal pain.  Evaluation by surgery with CT abdomen ordered and CT guided drain for possible abdominal abscess.                 Action/Plan: Following progression.   Expected Discharge Date:                  Expected Discharge Plan:     In-House Referral:     Discharge planning Services     Post Acute Care Choice:    Choice offered to:     DME Arranged:    DME Agency:     HH Arranged:    [place] Agency:     Status of Service:     Medicare Important Message Given:  Yes Date Medicare IM Given:    Medicare IM give by:    Date Additional Medicare IM Given:    Additional Medicare Important Message give by:     If discussed at [place] of Stay Meetings, dates discussed:    Additional Comments:  Jolly Mango, RN 09/29/2015, 2:17 PM

## 2015-09-29 NOTE — Progress Notes (Signed)
He reports he had some lower abdominal pain during the night and did take some pain medicine. He had another bowel movement last night and passed additional gas.  He reports some mild difficulty breathing and is receiving oxygen supplement.  Afebrile. Abdomen is minimally distended and tympanitic. There is mild to moderate right lower quadrant tenderness. His wound appears to be healing satisfactorily. Skin clips and suture removed.  White blood count today is 10,800 and has been trending down.  Impression persistent right lower quadrant pain with tenderness  Plan is repeat CT scanning of abdomen and pelvis with oral contrast but no IV contrast

## 2015-09-30 LAB — BASIC METABOLIC PANEL
Anion gap: 5 (ref 5–15)
BUN: 15 mg/dL (ref 6–20)
CALCIUM: 8 mg/dL — AB (ref 8.9–10.3)
CO2: 30 mmol/L (ref 22–32)
CREATININE: 0.84 mg/dL (ref 0.61–1.24)
Chloride: 106 mmol/L (ref 101–111)
GLUCOSE: 124 mg/dL — AB (ref 65–99)
Potassium: 3.4 mmol/L — ABNORMAL LOW (ref 3.5–5.1)
Sodium: 141 mmol/L (ref 135–145)

## 2015-09-30 LAB — CBC
HEMATOCRIT: 28.2 % — AB (ref 40.0–52.0)
Hemoglobin: 9.3 g/dL — ABNORMAL LOW (ref 13.0–18.0)
MCH: 27.7 pg (ref 26.0–34.0)
MCHC: 33.1 g/dL (ref 32.0–36.0)
MCV: 83.7 fL (ref 80.0–100.0)
PLATELETS: 291 10*3/uL (ref 150–440)
RBC: 3.37 MIL/uL — ABNORMAL LOW (ref 4.40–5.90)
RDW: 15.3 % — AB (ref 11.5–14.5)
WBC: 8 10*3/uL (ref 3.8–10.6)

## 2015-09-30 LAB — GLUCOSE, CAPILLARY
GLUCOSE-CAPILLARY: 123 mg/dL — AB (ref 65–99)
Glucose-Capillary: 120 mg/dL — ABNORMAL HIGH (ref 65–99)
Glucose-Capillary: 167 mg/dL — ABNORMAL HIGH (ref 65–99)
Glucose-Capillary: 127 mg/dL — ABNORMAL HIGH (ref 65–99)

## 2015-09-30 LAB — MAGNESIUM: MAGNESIUM: 1.8 mg/dL (ref 1.7–2.4)

## 2015-09-30 LAB — PHOSPHORUS: Phosphorus: 3.3 mg/dL (ref 2.5–4.6)

## 2015-09-30 MED ORDER — SODIUM CHLORIDE 0.9% FLUSH
10.0000 mL | Freq: Two times a day (BID) | INTRAVENOUS | Status: DC
Start: 2015-09-30 — End: 2015-10-04
  Administered 2015-09-30 (×2): 10 mL
  Administered 2015-10-01: 30 mL
  Administered 2015-10-01: 10 mL
  Administered 2015-10-02: 20 mL
  Administered 2015-10-03: 30 mL
  Administered 2015-10-03: 10 mL
  Administered 2015-10-04: 30 mL

## 2015-09-30 MED ORDER — SODIUM CHLORIDE 0.9% FLUSH: 10.0000 mL | INTRAVENOUS | Status: DC | PRN

## 2015-09-30 MED ORDER — FUROSEMIDE 10 MG/ML IJ SOLN: 20.0000 mg | Freq: Two times a day (BID) | INTRAMUSCULAR | Status: DC

## 2015-09-30 MED ORDER — POTASSIUM CHLORIDE CRYS ER 20 MEQ PO TBCR
40.0000 meq | EXTENDED_RELEASE_TABLET | Freq: Once | ORAL | Status: AC
  Administered 2015-09-30: 40 meq via ORAL
  Filled 2015-09-30: qty 2

## 2015-09-30 NOTE — Progress Notes (Signed)
Patient has rested quietly today.  Pain managed with PRN oxy. No other complaints. Pt walked in hall with PT. Up in chair most of the day.

## 2015-09-30 NOTE — Progress Notes (Signed)
PHARMACY - Follow up NOTE  Pharmacy Consult for electrolyte management     Allergies  Allergen Reactions  . Lipitor [Atorvastatin] Other (See Comments)    "leg cramps"  . Pravachol [Pravastatin Sodium] Other (See Comments)    "leg cramps"  . Riomet [Metformin Hcl] Diarrhea  . Vytorin [Ezetimibe-Simvastatin] Other (See Comments)    "leg cramps"    Patient Measurements: Height: '5\' 6"'$  (167.6 cm) Weight: 179 lb 0.2 oz (81.2 kg) IBW/kg (Calculated) : 63.8   Vital Signs: Temp: 97.5 F (36.4 C) (02/02 0522) Temp Source: Oral (02/02 0522) BP: 107/48 mmHg (02/02 0522) Pulse Rate: 72 (02/02 0522) Intake/Output from previous day: 02/01 0701 - 02/02 0700 In: 1017 [P.O.:760; I.V.:157; IV Piggyback:100] Out: 2650 [Urine:2425; Drains:225] Intake/Output from this shift:   Vent settings for last 24 hours:    Labs:  Recent Labs  09/28/15 0434 09/29/15 0500 09/29/15 1430 09/30/15 0528  WBC 11.8* 10.9*  --  8.0  HGB 9.6* 9.5*  --  9.3*  HCT 29.6* 28.6*  --  28.2*  PLT 298 296  --  291  APTT  --   --  41*  --   INR  --   --  1.28  --   CREATININE 0.87 0.78  --  0.84  MG  --  2.0  --  1.8  PHOS  --  2.6  --  3.3   Estimated Creatinine Clearance: 70.2 mL/min (by C-G formula based on Cr of 0.84).   Recent Labs  09/29/15 1652 09/29/15 2051 09/30/15 0733  GLUCAP 130* 142* 120*     Assessment: Pharmacy consulted for electrolyte management for 82 yo male admitted status post colon resection and postoperative inferior ST elevation MI who presents with abdominal pain.   Plan:  Electrolytes are within normal limits except K slightly low at 3.4. Will order KCl 40 mEq PO x1 for today. Will recheck K with am labs.  Mag and phos have been WNL this admission, will check these labs as needed.   Pharmacy will continue to monitor and adjust per consult.     Rayna Sexton, PharmD, BCPS Clinical Pharmacist 09/30/2015 8:34 AM

## 2015-09-30 NOTE — Evaluation (Signed)
Physical Therapy Evaluation Patient Details Name: Nicholas Parker MRN: [id number] DOB: 1933-09-27 Today's Date: 09/30/2015   History of Present Illness  Pt is an 82 y.o. male with PMH of colon resection 09/14/15 with post-op MI with cardiogenic shock and s/p cardiac stent placement 09/17/15.  Pt presented 09/24/15 with R lower quadrant abdominal pain; pt transferred to CCU 09/27/15 d/t V-tach (now transferred to telemetry floor) and found to have new R lower quadrant abscess on CT of abdomen.  Pt s/p CT guided abscess drain placement R abdomen 09/29/15.    Clinical Impression  Prior to admission pt was independent with ambulation and activities.  Pt lives with spouse within one story home.  Currently pt is utilizing 3 L/min of O2 at rest and activity.  Upon sit to stand (min assist) pt complained of being dizzy, which resided with continued standing.  Pt ambulated 165 feet with contact guard and RW.  Pt's gait was originally step to with shuffle; however, with verbal cueing, gait changed to step through with increased walking speed.  Pt described that the session fatigued him to 6/10.  Due to aforementioned function and strength deficits, pt is in need of skilled physical therapy.  It is recommended that pt is discharged to home with family support and home health PT when medically appropriate.     Follow Up Recommendations Home health PT    Equipment Recommendations  3in1 (PT);Rolling walker with 5" wheels    Recommendations for Other Services       Precautions / Restrictions Precautions Precautions: Fall Restrictions Weight Bearing Restrictions: No      Mobility  Bed Mobility    Deferred d/t pt sitting up in chair upon PT arrival.              Transfers Overall transfer level: Needs assistance Equipment used: Rolling walker (2 wheeled) Transfers: Sit to/from Stand Sit to Stand: Min assist  Vc's required for hand placement.          Ambulation/Gait Ambulation/Gait  assistance: Min guard Ambulation Distance (Feet): 165 Feet Assistive device: Rolling walker (2 wheeled) Gait Pattern/deviations: Step-to pattern;Shuffle (with verbal cues gait improved to step through and as a result gait speed increased) Gait velocity: decreased      Stairs            Wheelchair Mobility    Modified Rankin (Stroke Patients Only)       Balance Overall balance assessment: Needs assistance Sitting-balance support: Feet supported Sitting balance-Leahy Scale: Good     Standing balance support: Bilateral upper extremity supported on RW  Standing balance-Leahy Scale: Good                               Pertinent Vitals/Pain Pain Assessment: 0-10 Pain Score: 2  Pain Location: Right lower abdomen Pain Descriptors / Indicators: Aching Pain Intervention(s): Limited activity within patient's tolerance;Monitored during session;Premedicated before session;Repositioned  See flow sheet for vitals.     Home Living Family/patient expects to be discharged to:: Private residence Living Arrangements: Spouse/significant other Available Help at Discharge: Family Type of Home: House Home Access: Stairs to enter Entrance Stairs-Rails: Right Entrance Stairs-Number of Steps: 4 Home Layout: One level Home Equipment: Walker - 4 wheels      Prior Function Level of Independence: Independent               Hand Dominance        Extremity/Trunk Assessment  Upper Extremity Assessment: Overall WFL for tasks assessed           Lower Extremity Assessment: Overall WFL for tasks assessed      Cervical / Trunk Assessment: Normal  Communication   Communication: No difficulties  Cognition Arousal/Alertness: Awake/alert Behavior During Therapy: WFL for tasks assessed/performed Overall Cognitive Status: Within Functional Limits for tasks assessed                      General Comments General comments (skin integrity, edema, etc.):  Right lower abdomen JP drain intact. Nursing was contacted and cleared pt for physical therapy.  Pt was agreeable and tolerated session well.  Pt's wife present during session.    Exercises        Assessment/Plan    PT Assessment Patient needs continued PT services  PT Diagnosis Difficulty walking   PT Problem List Decreased strength;Decreased activity tolerance;Decreased balance;Decreased mobility;Pain  PT Treatment Interventions DME instruction;Gait training;Stair training;Functional mobility training;Therapeutic activities;Therapeutic exercise;Patient/family education   PT Goals (Current goals can be found in the Care Plan section) Acute Rehab PT Goals Patient Stated Goal: to go home  PT Goal Formulation: With patient Time For Goal Achievement: 10/14/15 Potential to Achieve Goals: Good    Frequency Min 2X/week   Barriers to discharge        Co-evaluation               End of Session Equipment Utilized During Treatment: Gait belt;Oxygen (3 L/min of O2) Activity Tolerance: Patient tolerated treatment well Patient left: in chair;with call bell/phone within reach;with chair alarm set;with family/visitor present Nurse Communication: Mobility status         Time: 7416-3845 PT Time Calculation (min) (ACUTE ONLY): 28 min   Charges:         PT G Codes:       [name], SPT [name] 09/30/2015, 10:44 AM

## 2015-09-30 NOTE — Progress Notes (Signed)
[place] at [facility]                                                                                                                                                                                            Patient Demographics   Laszlo Ellerby, is a 82 y.o. male, DOB - 05-Apr-1934, CBJ:[id number]  Admit date - 09/24/2015   Admitting Physician Bettey Costa, MD  Outpatient Primary MD for the patient is Young, MD   LOS - 6  Subjective: Patient feeling better had a drain placed yesterday by interventional radiology Review of Systems:   CONSTITUTIONAL: No documented fever. No fatigue, weakness. No weight gain, no weight loss.  EYES: No blurry or double vision.  ENT: No tinnitus. No postnasal drip. No redness of the oropharynx.  RESPIRATORY: No cough, no wheeze, no hemoptysis. Positive dyspnea.  CARDIOVASCULAR: No chest pain. No orthopnea. No palpitations. No syncope.  GASTROINTESTINAL:  Persistent abdominal pain GENITOURINARY: No dysuria or hematuria.  ENDOCRINE: No polyuria or nocturia. No heat or cold intolerance.  HEMATOLOGY: No anemia. No bruising. No bleeding.  INTEGUMENTARY: No rashes. No lesions.  MUSCULOSKELETAL: No arthritis. No swelling. No gout.  NEUROLOGIC: No numbness, tingling, or ataxia. No seizure-type activity.  PSYCHIATRIC: No anxiety. No insomnia. No ADD.    Vitals:   Filed Vitals:   09/29/15 1612 09/29/15 1719 09/29/15 2048 09/30/15 0522  BP: 114/58 111/53 125/52 107/48  Pulse: 83 80 80 72  Temp:   98.4 F (36.9 C) 97.5 F (36.4 C)  TempSrc:   Oral Oral  Resp: 33  18 18  Height:      Weight:      SpO2: 100% 99% 100% 100%    Wt Readings from Last 3 Encounters:  09/24/15 81.2 kg (179 lb 0.2 oz)  09/18/15 84.823 kg (187 lb)  09/06/15 81.647 kg (180 lb)     Intake/Output Summary (Last 24 hours) at 09/30/15 0844 Last data filed at 09/30/15 0730  Gross per 24 hour  Intake   1017 ml  Output   2525 ml  Net   -1508 ml    Physical Exam:   GENERAL: Pleasant-appearing in no apparent distress.  HEAD, EYES, EARS, NOSE AND THROAT: Atraumatic, normocephalic. Extraocular muscles are intact. Pupils equal and reactive to light. Sclerae anicteric. No conjunctival injection. No oro-pharyngeal erythema.  NECK: Supple. There is no jugular venous distention. No bruits, no lymphadenopathy, no thyromegaly.  HEART: Regular rate and rhythm,. No murmurs, no rubs, no clicks.  LUNGS: Clear to auscultation bilaterally. No rales or rhonchi. No wheezes.  ABDOMEN: Distended, diminished bowel  sounds there is no rebound tenderness or guarding drain in place EXTREMITIES: No evidence of any cyanosis, clubbing, or positive peripheral edema.  +2 pedal and radial pulses bilaterally. 1+ edema NEUROLOGIC: The patient is alert, awake, and oriented x3 with no focal motor or sensory deficits appreciated bilaterally.  SKIN: Moist and warm with no rashes appreciated.  Psych: Not anxious, depressed LN: No inguinal LN enlargement    Antibiotics   Anti-infectives    Start     Dose/Rate Route Frequency Ordered Stop   09/29/15 1400  meropenem (MERREM) 1 g in sodium chloride 0.9 % 100 mL IVPB     1 g 200 mL/hr over 30 Minutes Intravenous 3 times per day 09/29/15 1353     09/27/15 2200  meropenem (MERREM) 1 g in sodium chloride 0.9 % 100 mL IVPB  Status:  Discontinued     1 g 200 mL/hr over 30 Minutes Intravenous 3 times per day 09/27/15 1239 09/27/15 1412   09/27/15 1800  cefTRIAXone (ROCEPHIN) 2 g in dextrose 5 % 50 mL IVPB  Status:  Discontinued     2 g 100 mL/hr over 30 Minutes Intravenous Every 24 hours 09/27/15 1449 09/29/15 1352   09/26/15 1400  meropenem (MERREM) 1 g in sodium chloride 0.9 % 100 mL IVPB  Status:  Discontinued     1 g 200 mL/hr over 30 Minutes Intravenous 3 times per day 09/26/15 1124 09/26/15 1126   09/26/15 1130  meropenem (MERREM) 1 g in sodium chloride 0.9 % 100 mL IVPB  Status:  Discontinued     1 g 200  mL/hr over 30 Minutes Intravenous Every 12 hours 09/26/15 1126 09/27/15 1239      Medications   Scheduled Meds: . amiodarone  400 mg Oral BID  . aspirin EC  81 mg Oral Daily  . clopidogrel  75 mg Oral Q breakfast  . cyanocobalamin  250 mcg Oral Daily  . enoxaparin (LOVENOX) injection  40 mg Subcutaneous Q24H  . furosemide  20 mg Intravenous Q12H  . GLUCERNA  237 mL Oral TID  . insulin aspart  0-15 Units Subcutaneous TID WC  . insulin aspart  0-5 Units Subcutaneous QHS  . loratadine  10 mg Oral Daily  . meropenem (MERREM) IV  1 g Intravenous 3 times per day  . metoCLOPramide  5 mg Oral TID AC & HS  . pantoprazole  40 mg Oral Daily  . potassium chloride  40 mEq Oral Once  . senna-docusate  1 tablet Oral BID  . simethicone  80 mg Oral QID  . simvastatin  40 mg Oral q1800  . sodium chloride flush  10-40 mL Intracatheter Q12H  . sodium chloride flush  3 mL Intravenous Q12H   Continuous Infusions: . sodium chloride 10 mL/hr at 09/29/15 1446  . lidocaine Stopped (09/28/15 1537)   PRN Meds:.acetaminophen **OR** acetaminophen, alum & mag hydroxide-simeth, morphine injection, ondansetron **OR** ondansetron (ZOFRAN) IV, oxyCODONE-acetaminophen, phenol, sodium chloride, sodium chloride flush   Data Review:   Micro Results Recent Results (from the past 240 hour(s))  CULTURE, BLOOD (ROUTINE X 2) w Reflex to PCR ID Panel     Status: None (Preliminary result)   Collection Time: 09/26/15 11:34 AM  Result Value Ref Range Status   Specimen Description BLOOD LEFT HAND  Final   Special Requests   Final    BOTTLES DRAWN AEROBIC AND ANAEROBIC  AER 1CC ANA 3CC   Culture NO GROWTH 4 DAYS  Final   Report Status  PENDING  Incomplete  CULTURE, BLOOD (ROUTINE X 2) w Reflex to PCR ID Panel     Status: None (Preliminary result)   Collection Time: 09/26/15 11:35 AM  Result Value Ref Range Status   Specimen Description BLOOD LEFT ARM  Final   Special Requests BOTTLES DRAWN AEROBIC AND ANAEROBIC  10CC   Final   Culture NO GROWTH 4 DAYS  Final   Report Status PENDING  Incomplete  MRSA PCR Screening     Status: None   Collection Time: 09/27/15  9:15 AM  Result Value Ref Range Status   MRSA by PCR NEGATIVE NEGATIVE Final    Comment:        The GeneXpert MRSA Assay (FDA approved for NASAL specimens only), is one component of a comprehensive MRSA colonization surveillance program. It is not intended to diagnose MRSA infection nor to guide or monitor treatment for MRSA infections.     Radiology Reports Ct Abdomen Pelvis Wo Contrast  09/29/2015  CLINICAL DATA:  Right lower quadrant pain following right colectomy and lysis of adhesions EXAM: CT ABDOMEN AND PELVIS WITHOUT CONTRAST TECHNIQUE: Multidetector CT imaging of the abdomen and pelvis was performed following the standard protocol without IV contrast. COMPARISON:  09/24/2015 FINDINGS: Lung bases demonstrate bilateral pleural effusions. These are new from the prior exam. The liver, gallbladder, spleen, adrenal glands and pancreas are all stable from the prior exam. Multiple gallstones are again seen within the gallbladder. The kidneys demonstrate renal vascular calcifications although no calculi are identified. No obstructive changes are seen. Changes consistent with prior aortic stent graft repair are noted. No expansion of the aneurysm sac is seen. The bladder is well distended. There are again noted postsurgical changes consistent with prior distal colectomy as well as recent right colectomy. Contrast material is noted within the colon without significant small bowel dilatation indicating a patent anastomosis. In the right mid abdomen however extending from the tip of the liver there is a complex fluid collection identified which extends for approximately 18 cm along the right pericolic gutter. It measures approximately 5.6 cm in greatest AP dimension and approximately 7.7 cm in greatest transverse dimension. This has increased in the interval  from the prior exam and is consistent with postoperative abscess. Within the fluid collection there is some mottled air suggesting fecal material although to this is uncertain on the basis of this exam. A few other areas of free fluid are noted particularly surrounding the spleen as well as a few pockets in the left pericolic gutter. These may be sterile and are not significantly increased when compared with the prior exam. The osseous structures are stable. IMPRESSION: New right lower quadrant abscess as described above. These findings were communicated to Dr. Tamala Julian at time of exam interpretation the patient is planned for percutaneous drainage in the near future. Cholelithiasis without complicating factors. Postsurgical changes as described. Electronically Signed   By: Inez Catalina M.D.   On: 09/29/2015 13:36   Ct Abdomen Pelvis Wo Contrast  09/24/2015  ADDENDUM REPORT: 09/24/2015 13:15 ADDENDUM: It should be noted the kidneys are well visualized and demonstrate no definitive obstructive change. No renal calculi are seen although heavy renal vascular calcification is noted. Electronically Signed   By: Inez Catalina M.D.   On: 09/24/2015 13:15  09/24/2015  CLINICAL DATA:  Postop abdominal pain, history of recent colonic surgery EXAM: CT ABDOMEN AND PELVIS WITHOUT CONTRAST TECHNIQUE: Multidetector CT imaging of the abdomen and pelvis was performed following the standard protocol without IV  contrast. COMPARISON:  04/26/2015. FINDINGS: Lung bases are free of acute infiltrate or sizable effusion. The liver, spleen, adrenal glands and pancreas are all normal in their CT appearance. The gallbladder is well distended and demonstrates multiple dependent gallstones. No definitive pericholecystic fluid is seen. Small amounts of free fluid are noted surrounding the liver and spleen. These changes are likely related to the recent colonic resection. Postsurgical changes are noted in the right colon consistent with the  patient's given clinical history. No obstructive changes are seen. A tiny focus of air is noted adjacent to the suture line again likely related to the recent surgery. The anastomosis appears widely patent. Changes of prior sigmoid colectomy are also noted. No significant obstructive changes are noted. Aortic stent graft is noted in satisfactory position. Comparison with prior exam shows a stable appearing aneurysm sac. Iliac calcifications are seen without aneurysmal dilatation. The bladder is well distended. Prostate calcifications are seen. No significant lymphadenopathy is noted. The osseous structures show degenerative change of the lumbar spine. IMPRESSION: Small amounts of free fluid and free air likely related to the recent colonic surgery. No significant findings to suggest acute perforation are noted at this time. Multiple gallstones without definitive complicating factors. Electronically Signed: By: Inez Catalina M.D. On: 09/24/2015 12:29   Dg Abd 1 View  09/25/2015  CLINICAL DATA:  82 year old male with abdominal distension and pain. Recent colonic resection. EXAM: ABDOMEN - 1 VIEW COMPARISON:  09/24/2015 CT FINDINGS: Gaseous distension of the colon is noted. There is some oral contrast from recent CT present within the proximal colon. No dilated small bowel loops are noted. Surgical staples and clips are identified as well as an aortic stent graft. IMPRESSION: Gaseous colonic distention likely representing ileus. No dilated small bowel loops identified. Electronically Signed   By: Margarette Canada M.D.   On: 09/25/2015 10:53   US Renal  09/18/2015  CLINICAL DATA:  Acute renal failure. EXAM: RENAL / URINARY TRACT ULTRASOUND COMPLETE COMPARISON:  CT of the abdomen and pelvis 04/26/2015 FINDINGS: Right Kidney: Length: 11.1. Echogenicity within normal limits. No mass or hydronephrosis visualized. Left Kidney: Length: 11.7. Echogenicity within normal limits. No mass or hydronephrosis visualized. There is a  questionable exophytic left lower pole renal mass measuring 1.9 x 1.4 x 1.4 cm. Bladder: Appears normal for degree of bladder distention. IMPRESSION: No evidence of hydronephrosis. Questionable exophytic left lower pole renal mass versus complicated cyst measuring 1.9 cm. Attention on follow-up is recommended. Electronically Signed   By: Fidela Salisbury M.D.   On: 09/18/2015 10:27   Dg Chest Portable 1 View  09/24/2015  CLINICAL DATA:  Central line placement.  NG tube placement EXAM: PORTABLE CHEST 1 VIEW COMPARISON:  09/24/2015 FINDINGS: Right central line tip is in the SVC. No pneumothorax. NG tube enters the stomach. Heart is upper limits normal in size. Bibasilar opacities, likely atelectasis. Mild vascular congestion. No effusions or acute bony abnormality. IMPRESSION: Right central line tip in the SVC. No pneumothorax. NG tube enters the stomach. Mild vascular congestion and bibasilar atelectasis. Electronically Signed   By: Rolm Baptise M.D.   On: 09/24/2015 18:01   Dg Chest Port 1 View  09/24/2015  CLINICAL DATA:  Recent colon resection, epigastric pain, shortness of breath, last bowel movement yesterday, hypertension, diabetes mellitus, coronary artery disease post angioplasty, hyperlipidemia, GERD EXAM: PORTABLE CHEST 1 VIEW COMPARISON:  Portable exam 1000 hours compared to 04/14/2014 FINDINGS: Enlargement of cardiac silhouette. Atherosclerotic calcification aorta. Mediastinal contours and pulmonary vascularity normal for  technique. Lungs clear. No pleural effusion or pneumothorax. Bones demineralized. IMPRESSION: Minimal enlargement of cardiac silhouette. No acute abnormalities. Electronically Signed   By: Lavonia Dana M.D.   On: 09/24/2015 10:25   Dg Abd Acute W/chest  09/27/2015  CLINICAL DATA:  Abdominal pain.  Recent partial colectomy EXAM: DG ABDOMEN ACUTE W/ 1V CHEST COMPARISON:  Two days ago FINDINGS: Normal heart size. Negative aortic and hilar contours. Right IJ central line, tip at  the SVC level. COPD changes. There is no edema, consolidation, effusion, or pneumothorax. Unchanged gaseous distention of colon, greatest in the left abdomen. Gas and stool reaches the rectum. No pneumoperitoneum. No evidence of pneumatosis. Aortoiliac stenting. IMPRESSION: Unchanged gaseous distention of the postoperative colon, likely adynamic ileus. Electronically Signed   By: Monte Fantasia M.D.   On: 09/27/2015 05:56   Ct Image Guided Drainage By Percutaneous Catheter  09/29/2015  INDICATION: History of recent right colectomy with enlarging right pericolic fluid collection now with air within the suggestive of intra-abdominal abscess. EXAM: CT GUIDED DRAINAGE OF RIGHT PERICOLIC ABSCESS MEDICATIONS: The patient is currently admitted to the hospital and receiving intravenous antibiotics. The antibiotics were administered within an appropriate time frame prior to the initiation of the procedure. ANESTHESIA/SEDATION: NONE COMPLICATIONS: None immediate. TECHNIQUE: Informed written consent was obtained from the patient after a thorough discussion of the procedural risks, benefits and alternatives. All questions were addressed. Maximal Sterile Barrier Technique was utilized including caps, mask, sterile gowns, sterile gloves, sterile drape, hand hygiene and skin antiseptic. A timeout was performed prior to the initiation of the procedure. PROCEDURE: The operative field was prepped with Chlorhexidine in a sterile fashion, and a sterile drape was applied covering the operative field. A sterile gown and sterile gloves were used for the procedure. Local anesthesia was provided with 1% Lidocaine. Utilizing CT guidance and a quick stick technique a 10 French drainage catheter was placed into the right pericolic fluid collection without difficulty. Turbid serous fluid was withdrawn without difficulty and sent for laboratory evaluation. Catheter was then sutured into place utilizing 0 Prolene and placed to an external  suction grenade. The catheter was then dressed in the standard manner. The patient tolerated the procedure well and was returned to his room in satisfactory condition. FINDINGS: Stable air-fluid collection in the right pericolic gutter similar to that seen on recent CT examination. IMPRESSION: Successful placement of a drainage catheter under CT guidance as described. Electronically Signed   By: Inez Catalina M.D.   On: 09/29/2015 16:51     CBC  Recent Labs Lab 09/24/15 0947  09/26/15 0432 09/27/15 0450 09/28/15 0434 09/29/15 0500 09/30/15 0528  WBC 18.3*  < > 17.2* 13.9* 11.8* 10.9* 8.0  HGB 12.3*  < > 10.2* 9.5* 9.6* 9.5* 9.3*  HCT 37.1*  < > 31.2* 29.1* 29.6* 28.6* 28.2*  PLT 489*  < > 283 272 298 296 291  MCV 84.2  < > 84.4 83.8 85.0 83.7 83.7  MCH 28.0  < > 27.5 27.5 27.5 27.7 27.7  MCHC 33.3  < > 32.6 32.8 32.3 33.1 33.1  RDW 15.7*  < > 15.9* 15.6* 15.8* 15.4* 15.3*  LYMPHSABS 1.1  --   --   --  0.4*  --   --   MONOABS 0.7  --   --   --  0.6  --   --   EOSABS 0.0  --   --   --  0.1  --   --   BASOSABS 0.1  --   --   --  0.0  --   --   < > = values in this interval not displayed.  Chemistries   Recent Labs Lab 09/24/15 0947  09/26/15 0432 09/27/15 0450 09/28/15 0434 09/29/15 0500 09/30/15 0528  NA 137  < > 136 138 133* 139 141  K 3.9  < > 4.7 3.9 4.3 3.8 3.4*  CL 99*  < > 106 105 103 106 106  CO2 22  < > 21* '24 24 27 30  '$ GLUCOSE 166*  < > 116* 217* 148* 130* 124*  BUN 29*  < > 33* 31* 22* 19 15  CREATININE 2.15*  < > 1.30* 1.11 0.87 0.78 0.84  CALCIUM 9.0  < > 8.2* 7.8* 7.5* 8.0* 8.0*  MG  --   --   --  2.4  --  2.0 1.8  AST 36  --   --   --   --   --   --   ALT 20  --   --   --   --   --   --   ALKPHOS 55  --   --   --   --   --   --   BILITOT 1.3*  --   --   --   --   --   --   < > = values in this interval not displayed. ------------------------------------------------------------------------------------------------------------------ estimated creatinine  clearance is 70.2 mL/min (by C-G formula based on Cr of 0.84). ------------------------------------------------------------------------------------------------------------------ No results for input(s): HGBA1C in the last 72 hours. ------------------------------------------------------------------------------------------------------------------ No results for input(s): CHOL, HDL, LDLCALC, TRIG, CHOLHDL, LDLDIRECT in the last 72 hours. ------------------------------------------------------------------------------------------------------------------ No results for input(s): TSH, T4TOTAL, T3FREE, THYROIDAB in the last 72 hours.  Invalid input(s): FREET3 ------------------------------------------------------------------------------------------------------------------ No results for input(s): VITAMINB12, FOLATE, FERRITIN, TIBC, IRON, RETICCTPCT in the last 72 hours.  Coagulation profile  Recent Labs Lab 09/29/15 1430  INR 1.28    No results for input(s): DDIMER in the last 72 hours.  Cardiac Enzymes  Recent Labs Lab 09/25/15 0224 09/25/15 0828 09/27/15 0450  TROPONINI 6.59* 6.77* 5.86*   ------------------------------------------------------------------------------------------------------------------ Invalid input(s): POCBNP    Assessment & Plan   82 year old male status post colon resection and postoperative inferior ST elevation MI who presents with abdominal pain which is now subsided but now has acute renal failure.  1. ventricular tachycardia: Repeat echocardiogram mild decrease in EF seen by cardiology continue amiodarone now orally no further arrhythmia  2. Shortness of breath suspect due to fluid overload with acute systolic CHF continue IV Lasix patient responding well to diuresis  3. Abdominal pain: Has a fluid collection in the abdomen status post drain placed continue IV antibiotics await culture  5. Recent ST elevation MI: Patient has a drug-eluting stent in RCA.  He has been off of Plavix for at least 2 days. Continue aspirin and Plavix.  6. Hypotension: Now resolved  6. Type 2 diabetes: Blood sugars improved    Code Status Orders        Start     Ordered   09/24/15 1846  Full code   Continuous     09/24/15 1845    Code Status History    Date Active Date Inactive Code Status Order ID Comments User Context   09/17/2015  2:05 PM 09/21/2015  3:59 PM Full Code [id number]  Wellington Hampshire, MD Inpatient   09/14/2015  4:25 PM 09/17/2015  2:05 PM Full Code [id number]  Leonie Green, MD Inpatient    Advance Directive Documentation  Most Recent Value   Type of Advance Directive  Living will   Pre-existing out of facility DNR order (yellow form or pink MOST form)     "MOST" Form in Place?             Consults surgery cardiology DVT Prophylaxis  Lovenox   Lab Results  Component Value Date   PLT 291 09/30/2015     Time Spent in minutes 32 minutes spent     Dustin Flock M.D on 09/30/2015 at 8:44 AM  Between 7am to 6pm - Pager - [phone]  After 6pm go to [email or web address] - password [email or web address] [facility]   Office  [phone]

## 2015-09-30 NOTE — Progress Notes (Signed)
He has tolerated some solid food.  He reports his abdomen is feeling better.  He has walked in the hallway.  His central line dressing is loose.  I have discussed with the nurse to change the central line dressing with a central line dressing tray. Plan is to convert the central line to a saline lock so that he can walk more easily in the hallway and still receive his intravenous antibiotics  Culture pending.  The drain is draining serous fluid with some opaque material  He was continued to do more more walking in the hallway.

## 2015-09-30 NOTE — Progress Notes (Signed)
Patient rested quietly tonight and pain controlled with PRN Percocet. 79m of yellow, cloudy fluid with sediment drained from JP bulb overnight. Drain site clean, dry, and intact. Wife at bedside. A&Ox4 and VSS. Nursing staff will continue to monitor. KEarleen Reaper RN

## 2015-09-30 NOTE — Progress Notes (Signed)
He reports moderate abdominal discomfort. He has been taking some clear liquids and some full liquids. He did have some flatus and bowel movement yesterday. His abdominal fluid drainage catheter continues to drain a serous fluid.  Abdomen is minimally distended and soft with minimal right lower quadrant tenderness. His midline incision appears to be healing satisfactorily. The dressing around the catheter is intact.  White blood count this morning is 8000.Marland Kitchen Abdominal fluid cultures are pending.  Plan is to advance to diabetic diet. Encourage walking in the hallway. We'll need to continue intravenous antibiotics.

## 2015-10-01 ENCOUNTER — Inpatient Hospital Stay: Payer: Commercial Managed Care - HMO

## 2015-10-01 LAB — CBC
HEMATOCRIT: 28.6 % — AB (ref 40.0–52.0)
HEMOGLOBIN: 9.3 g/dL — AB (ref 13.0–18.0)
MCH: 27.2 pg (ref 26.0–34.0)
MCHC: 32.6 g/dL (ref 32.0–36.0)
MCV: 83.4 fL (ref 80.0–100.0)
Platelets: 313 10*3/uL (ref 150–440)
RBC: 3.43 MIL/uL — ABNORMAL LOW (ref 4.40–5.90)
RDW: 15.5 % — ABNORMAL HIGH (ref 11.5–14.5)
WBC: 7.8 10*3/uL (ref 3.8–10.6)

## 2015-10-01 LAB — BASIC METABOLIC PANEL
ANION GAP: 7 (ref 5–15)
BUN: 14 mg/dL (ref 6–20)
CALCIUM: 8.1 mg/dL — AB (ref 8.9–10.3)
CO2: 32 mmol/L (ref 22–32)
Chloride: 102 mmol/L (ref 101–111)
Creatinine, Ser: 0.9 mg/dL (ref 0.61–1.24)
GFR calc Af Amer: >60 mL/min (ref >60)
GFR calc non Af Amer: >60 mL/min (ref >60)
GLUCOSE: 124 mg/dL — AB (ref 65–99)
Potassium: 3.4 mmol/L — ABNORMAL LOW (ref 3.5–5.1)
Sodium: 141 mmol/L (ref 135–145)

## 2015-10-01 LAB — GLUCOSE, CAPILLARY
GLUCOSE-CAPILLARY: 117 mg/dL — AB (ref 65–99)
GLUCOSE-CAPILLARY: 134 mg/dL — AB (ref 65–99)
Glucose-Capillary: 141 mg/dL — ABNORMAL HIGH (ref 65–99)
Glucose-Capillary: 135 mg/dL — ABNORMAL HIGH (ref 65–99)

## 2015-10-01 LAB — CULTURE, BLOOD (ROUTINE X 2)
Culture: NO GROWTH
Culture: NO GROWTH

## 2015-10-01 MED ORDER — POTASSIUM CHLORIDE CRYS ER 20 MEQ PO TBCR
40.0000 meq | EXTENDED_RELEASE_TABLET | Freq: Once | ORAL | Status: AC
  Administered 2015-10-01: 40 meq via ORAL
  Filled 2015-10-01: qty 2

## 2015-10-01 MED ORDER — AMIODARONE HCL 200 MG PO TABS
400.0000 mg | ORAL_TABLET | Freq: Every day | ORAL | Status: DC
  Administered 2015-10-02 – 2015-10-04 (×3): 400 mg via ORAL
  Filled 2015-10-01 (×3): qty 2

## 2015-10-01 NOTE — Progress Notes (Signed)
PHARMACY - Follow up NOTE  Pharmacy Consult for electrolyte management     Allergies  Allergen Reactions  . Lipitor [Atorvastatin] Other (See Comments)    "leg cramps"  . Pravachol [Pravastatin Sodium] Other (See Comments)    "leg cramps"  . Riomet [Metformin Hcl] Diarrhea  . Vytorin [Ezetimibe-Simvastatin] Other (See Comments)    "leg cramps"    Patient Measurements: Height: '5\' 6"'$  (167.6 cm) Weight: 179 lb 0.2 oz (81.2 kg) IBW/kg (Calculated) : 63.8   Vital Signs: Temp: 97.4 F (36.3 C) (02/03 0352) Temp Source: Oral (02/03 0352) BP: 126/54 mmHg (02/03 0352) Pulse Rate: 82 (02/03 0352) Intake/Output from previous day: 02/02 0701 - 02/03 0700 In: 240 [P.O.:240] Out: 2450 [Urine:2350; Drains:100] Intake/Output from this shift:   Vent settings for last 24 hours:    Labs:  Recent Labs  09/29/15 0500 09/29/15 1430 09/30/15 0528 10/01/15 0556  WBC 10.9*  --  8.0 7.8  HGB 9.5*  --  9.3* 9.3*  HCT 28.6*  --  28.2* 28.6*  PLT 296  --  291 313  APTT  --  41*  --   --   INR  --  1.28  --   --   CREATININE 0.78  --  0.84 0.90  MG 2.0  --  1.8  --   PHOS 2.6  --  3.3  --    Estimated Creatinine Clearance: 65.6 mL/min (by C-G formula based on Cr of 0.9).   Recent Labs  09/30/15 1119 09/30/15 1619 09/30/15 2042  GLUCAP 167* 127* 123*     Assessment: Pharmacy consulted for electrolyte management for 82 yo male admitted status post colon resection and postoperative inferior ST elevation MI who presents with abdominal pain.   Plan:  Electrolytes are within normal limits except K slightly low at 3.4. Will order KCl 40 mEq PO x1 for today. Will recheck K with am labs.  Mag and phos have been WNL this admission, will check these labs as needed.   Pharmacy will continue to monitor and adjust per consult.    Roe Coombs, PharmD Pharmacy Resident 10/01/2015

## 2015-10-01 NOTE — Care Management Important Message (Signed)
Important Message  Patient Details  Name: Nicholas Parker MRN: [id number] Date of Birth: 1933/10/30   Medicare Important Message Given:  Yes    [name] 10/01/2015, 1:41 PM

## 2015-10-01 NOTE — Progress Notes (Addendum)
[place] at [facility]                                                                                                                                                                                            Patient Demographics   Nicholas Parker, is a 82 y.o. male, DOB - 1934-07-09, FUX:[id number]  Admit date - 09/24/2015   Admitting Physician Bettey Costa, MD  Outpatient Primary MD for the patient is [place] III, MD   LOS - 7  Subjective: Feeling better denies any abdominal pain no chest pain His left upper extremities appears more swollen than the right    Review of Systems:   CONSTITUTIONAL: No documented fever. No fatigue, weakness. No weight gain, no weight loss.  EYES: No blurry or double vision.  ENT: No tinnitus. No postnasal drip. No redness of the oropharynx.  RESPIRATORY: No cough, no wheeze, no hemoptysis. Positive dyspnea.  CARDIOVASCULAR: No chest pain. No orthopnea. No palpitations. No syncope.  GASTROINTESTINAL:  Persistent abdominal pain GENITOURINARY: No dysuria or hematuria.  ENDOCRINE: No polyuria or nocturia. No heat or cold intolerance.  HEMATOLOGY: No anemia. No bruising. No bleeding.  INTEGUMENTARY: No rashes. No lesions.  MUSCULOSKELETAL: No arthritis. No swelling. No gout.  NEUROLOGIC: No numbness, tingling, or ataxia. No seizure-type activity.  PSYCHIATRIC: No anxiety. No insomnia. No ADD.    Vitals:   Filed Vitals:   09/30/15 1117 09/30/15 2040 10/01/15 0352 10/01/15 0844  BP: 109/54 107/56 126/54 112/51  Pulse: 82 82 82 81  Temp: 97.4 F (36.3 C) 98.2 F (36.8 C) 97.4 F (36.3 C)   TempSrc: Oral Oral Oral   Resp: '18 18 18 17  '$ Height:      Weight:      SpO2: 99% 96% 99% 100%    Wt Readings from Last 3 Encounters:  09/24/15 81.2 kg (179 lb 0.2 oz)  09/18/15 84.823 kg (187 lb)  09/06/15 81.647 kg (180 lb)     Intake/Output Summary (Last 24 hours) at 10/01/15 0845 Last data filed at 10/01/15  0616  Gross per 24 hour  Intake    120 ml  Output   2350 ml  Net  -2230 ml    Physical Exam:   GENERAL: Pleasant-appearing in no apparent distress.  HEAD, EYES, EARS, NOSE AND THROAT: Atraumatic, normocephalic. Extraocular muscles are intact. Pupils equal and reactive to light. Sclerae anicteric. No conjunctival injection. No oro-pharyngeal erythema.  NECK: Supple. There is no jugular venous distention. No bruits, no lymphadenopathy, no thyromegaly.  HEART: Regular rate and rhythm,. No murmurs, no rubs, no clicks.  LUNGS:  Clear to auscultation bilaterally. No rales or rhonchi. No wheezes.  ABDOMEN: Distended, diminished bowel sounds there is no rebound tenderness or guarding drain in place EXTREMITIES: 1+ edema in the lower extremity, as well as swelling of the left upper extremity NEUROLOGIC: The patient is alert, awake, and oriented x3 with no focal motor or sensory deficits appreciated bilaterally.  SKIN: Moist and warm with no rashes appreciated.  Psych: Not anxious, depressed LN: No inguinal LN enlargement    Antibiotics   Anti-infectives    Start     Dose/Rate Route Frequency Ordered Stop   09/29/15 1400  meropenem (MERREM) 1 g in sodium chloride 0.9 % 100 mL IVPB     1 g 200 mL/hr over 30 Minutes Intravenous 3 times per day 09/29/15 1353     09/27/15 2200  meropenem (MERREM) 1 g in sodium chloride 0.9 % 100 mL IVPB  Status:  Discontinued     1 g 200 mL/hr over 30 Minutes Intravenous 3 times per day 09/27/15 1239 09/27/15 1412   09/27/15 1800  cefTRIAXone (ROCEPHIN) 2 g in dextrose 5 % 50 mL IVPB  Status:  Discontinued     2 g 100 mL/hr over 30 Minutes Intravenous Every 24 hours 09/27/15 1449 09/29/15 1352   09/26/15 1400  meropenem (MERREM) 1 g in sodium chloride 0.9 % 100 mL IVPB  Status:  Discontinued     1 g 200 mL/hr over 30 Minutes Intravenous 3 times per day 09/26/15 1124 09/26/15 1126   09/26/15 1130  meropenem (MERREM) 1 g in sodium chloride 0.9 % 100 mL IVPB   Status:  Discontinued     1 g 200 mL/hr over 30 Minutes Intravenous Every 12 hours 09/26/15 1126 09/27/15 1239      Medications   Scheduled Meds: . amiodarone  400 mg Oral BID  . aspirin EC  81 mg Oral Daily  . clopidogrel  75 mg Oral Q breakfast  . cyanocobalamin  250 mcg Oral Daily  . enoxaparin (LOVENOX) injection  40 mg Subcutaneous Q24H  . furosemide  20 mg Intravenous Q12H  . GLUCERNA  237 mL Oral TID  . insulin aspart  0-15 Units Subcutaneous TID WC  . insulin aspart  0-5 Units Subcutaneous QHS  . loratadine  10 mg Oral Daily  . meropenem (MERREM) IV  1 g Intravenous 3 times per day  . metoCLOPramide  5 mg Oral TID AC & HS  . pantoprazole  40 mg Oral Daily  . potassium chloride  40 mEq Oral Once  . senna-docusate  1 tablet Oral BID  . simethicone  80 mg Oral QID  . simvastatin  40 mg Oral q1800  . sodium chloride flush  10-40 mL Intracatheter Q12H  . sodium chloride flush  3 mL Intravenous Q12H   Continuous Infusions: . lidocaine Stopped (09/28/15 1537)   PRN Meds:.acetaminophen **OR** acetaminophen, alum & mag hydroxide-simeth, morphine injection, ondansetron **OR** ondansetron (ZOFRAN) IV, oxyCODONE-acetaminophen, phenol, sodium chloride, sodium chloride flush   Data Review:   Micro Results Recent Results (from the past 240 hour(s))  CULTURE, BLOOD (ROUTINE X 2) w Reflex to PCR ID Panel     Status: None (Preliminary result)   Collection Time: 09/26/15 11:34 AM  Result Value Ref Range Status   Specimen Description BLOOD LEFT HAND  Final   Special Requests   Final    BOTTLES DRAWN AEROBIC AND ANAEROBIC  AER 1CC ANA 3CC   Culture NO GROWTH 4 DAYS  Final  Report Status PENDING  Incomplete  CULTURE, BLOOD (ROUTINE X 2) w Reflex to PCR ID Panel     Status: None (Preliminary result)   Collection Time: 09/26/15 11:35 AM  Result Value Ref Range Status   Specimen Description BLOOD LEFT ARM  Final   Special Requests BOTTLES DRAWN AEROBIC AND ANAEROBIC  10CC  Final    Culture NO GROWTH 4 DAYS  Final   Report Status PENDING  Incomplete  MRSA PCR Screening     Status: None   Collection Time: 09/27/15  9:15 AM  Result Value Ref Range Status   MRSA by PCR NEGATIVE NEGATIVE Final    Comment:        The GeneXpert MRSA Assay (FDA approved for NASAL specimens only), is one component of a comprehensive MRSA colonization surveillance program. It is not intended to diagnose MRSA infection nor to guide or monitor treatment for MRSA infections.   Culture, routine-abscess     Status: None (Preliminary result)   Collection Time: 09/29/15  5:11 PM  Result Value Ref Range Status   Specimen Description BIOPSY  Final   Special Requests Normal  Final   Gram Stain PENDING  Incomplete   Culture HOLDING FOR POSSIBLE PATHOGEN  Final   Report Status PENDING  Incomplete    Radiology Reports Ct Abdomen Pelvis Wo Contrast  09/29/2015  CLINICAL DATA:  Right lower quadrant pain following right colectomy and lysis of adhesions EXAM: CT ABDOMEN AND PELVIS WITHOUT CONTRAST TECHNIQUE: Multidetector CT imaging of the abdomen and pelvis was performed following the standard protocol without IV contrast. COMPARISON:  09/24/2015 FINDINGS: Lung bases demonstrate bilateral pleural effusions. These are new from the prior exam. The liver, gallbladder, spleen, adrenal glands and pancreas are all stable from the prior exam. Multiple gallstones are again seen within the gallbladder. The kidneys demonstrate renal vascular calcifications although no calculi are identified. No obstructive changes are seen. Changes consistent with prior aortic stent graft repair are noted. No expansion of the aneurysm sac is seen. The bladder is well distended. There are again noted postsurgical changes consistent with prior distal colectomy as well as recent right colectomy. Contrast material is noted within the colon without significant small bowel dilatation indicating a patent anastomosis. In the right mid abdomen  however extending from the tip of the liver there is a complex fluid collection identified which extends for approximately 18 cm along the right pericolic gutter. It measures approximately 5.6 cm in greatest AP dimension and approximately 7.7 cm in greatest transverse dimension. This has increased in the interval from the prior exam and is consistent with postoperative abscess. Within the fluid collection there is some mottled air suggesting fecal material although to this is uncertain on the basis of this exam. A few other areas of free fluid are noted particularly surrounding the spleen as well as a few pockets in the left pericolic gutter. These may be sterile and are not significantly increased when compared with the prior exam. The osseous structures are stable. IMPRESSION: New right lower quadrant abscess as described above. These findings were communicated to Dr. Tamala Julian at time of exam interpretation the patient is planned for percutaneous drainage in the near future. Cholelithiasis without complicating factors. Postsurgical changes as described. Electronically Signed   By: Inez Catalina M.D.   On: 09/29/2015 13:36   Ct Abdomen Pelvis Wo Contrast  09/24/2015  ADDENDUM REPORT: 09/24/2015 13:15 ADDENDUM: It should be noted the kidneys are well visualized and demonstrate no definitive obstructive change.  No renal calculi are seen although heavy renal vascular calcification is noted. Electronically Signed   By: Inez Catalina M.D.   On: 09/24/2015 13:15  09/24/2015  CLINICAL DATA:  Postop abdominal pain, history of recent colonic surgery EXAM: CT ABDOMEN AND PELVIS WITHOUT CONTRAST TECHNIQUE: Multidetector CT imaging of the abdomen and pelvis was performed following the standard protocol without IV contrast. COMPARISON:  04/26/2015. FINDINGS: Lung bases are free of acute infiltrate or sizable effusion. The liver, spleen, adrenal glands and pancreas are all normal in their CT appearance. The gallbladder is well  distended and demonstrates multiple dependent gallstones. No definitive pericholecystic fluid is seen. Small amounts of free fluid are noted surrounding the liver and spleen. These changes are likely related to the recent colonic resection. Postsurgical changes are noted in the right colon consistent with the patient's given clinical history. No obstructive changes are seen. A tiny focus of air is noted adjacent to the suture line again likely related to the recent surgery. The anastomosis appears widely patent. Changes of prior sigmoid colectomy are also noted. No significant obstructive changes are noted. Aortic stent graft is noted in satisfactory position. Comparison with prior exam shows a stable appearing aneurysm sac. Iliac calcifications are seen without aneurysmal dilatation. The bladder is well distended. Prostate calcifications are seen. No significant lymphadenopathy is noted. The osseous structures show degenerative change of the lumbar spine. IMPRESSION: Small amounts of free fluid and free air likely related to the recent colonic surgery. No significant findings to suggest acute perforation are noted at this time. Multiple gallstones without definitive complicating factors. Electronically Signed: By: Inez Catalina M.D. On: 09/24/2015 12:29   Dg Abd 1 View  09/25/2015  CLINICAL DATA:  82 year old male with abdominal distension and pain. Recent colonic resection. EXAM: ABDOMEN - 1 VIEW COMPARISON:  09/24/2015 CT FINDINGS: Gaseous distension of the colon is noted. There is some oral contrast from recent CT present within the proximal colon. No dilated small bowel loops are noted. Surgical staples and clips are identified as well as an aortic stent graft. IMPRESSION: Gaseous colonic distention likely representing ileus. No dilated small bowel loops identified. Electronically Signed   By: Margarette Canada M.D.   On: 09/25/2015 10:53   US Renal  09/18/2015  CLINICAL DATA:  Acute renal failure. EXAM: RENAL /  URINARY TRACT ULTRASOUND COMPLETE COMPARISON:  CT of the abdomen and pelvis 04/26/2015 FINDINGS: Right Kidney: Length: 11.1. Echogenicity within normal limits. No mass or hydronephrosis visualized. Left Kidney: Length: 11.7. Echogenicity within normal limits. No mass or hydronephrosis visualized. There is a questionable exophytic left lower pole renal mass measuring 1.9 x 1.4 x 1.4 cm. Bladder: Appears normal for degree of bladder distention. IMPRESSION: No evidence of hydronephrosis. Questionable exophytic left lower pole renal mass versus complicated cyst measuring 1.9 cm. Attention on follow-up is recommended. Electronically Signed   By: Fidela Salisbury M.D.   On: 09/18/2015 10:27   Dg Chest Portable 1 View  09/24/2015  CLINICAL DATA:  Central line placement.  NG tube placement EXAM: PORTABLE CHEST 1 VIEW COMPARISON:  09/24/2015 FINDINGS: Right central line tip is in the SVC. No pneumothorax. NG tube enters the stomach. Heart is upper limits normal in size. Bibasilar opacities, likely atelectasis. Mild vascular congestion. No effusions or acute bony abnormality. IMPRESSION: Right central line tip in the SVC. No pneumothorax. NG tube enters the stomach. Mild vascular congestion and bibasilar atelectasis. Electronically Signed   By: Rolm Baptise M.D.   On: 09/24/2015 18:01  Dg Chest Port 1 View  09/24/2015  CLINICAL DATA:  Recent colon resection, epigastric pain, shortness of breath, last bowel movement yesterday, hypertension, diabetes mellitus, coronary artery disease post angioplasty, hyperlipidemia, GERD EXAM: PORTABLE CHEST 1 VIEW COMPARISON:  Portable exam 1000 hours compared to 04/14/2014 FINDINGS: Enlargement of cardiac silhouette. Atherosclerotic calcification aorta. Mediastinal contours and pulmonary vascularity normal for technique. Lungs clear. No pleural effusion or pneumothorax. Bones demineralized. IMPRESSION: Minimal enlargement of cardiac silhouette. No acute abnormalities.  Electronically Signed   By: Lavonia Dana M.D.   On: 09/24/2015 10:25   Dg Abd Acute W/chest  09/27/2015  CLINICAL DATA:  Abdominal pain.  Recent partial colectomy EXAM: DG ABDOMEN ACUTE W/ 1V CHEST COMPARISON:  Two days ago FINDINGS: Normal heart size. Negative aortic and hilar contours. Right IJ central line, tip at the SVC level. COPD changes. There is no edema, consolidation, effusion, or pneumothorax. Unchanged gaseous distention of colon, greatest in the left abdomen. Gas and stool reaches the rectum. No pneumoperitoneum. No evidence of pneumatosis. Aortoiliac stenting. IMPRESSION: Unchanged gaseous distention of the postoperative colon, likely adynamic ileus. Electronically Signed   By: Monte Fantasia M.D.   On: 09/27/2015 05:56   Ct Image Guided Drainage By Percutaneous Catheter  09/29/2015  INDICATION: History of recent right colectomy with enlarging right pericolic fluid collection now with air within the suggestive of intra-abdominal abscess. EXAM: CT GUIDED DRAINAGE OF RIGHT PERICOLIC ABSCESS MEDICATIONS: The patient is currently admitted to the hospital and receiving intravenous antibiotics. The antibiotics were administered within an appropriate time frame prior to the initiation of the procedure. ANESTHESIA/SEDATION: NONE COMPLICATIONS: None immediate. TECHNIQUE: Informed written consent was obtained from the patient after a thorough discussion of the procedural risks, benefits and alternatives. All questions were addressed. Maximal Sterile Barrier Technique was utilized including caps, mask, sterile gowns, sterile gloves, sterile drape, hand hygiene and skin antiseptic. A timeout was performed prior to the initiation of the procedure. PROCEDURE: The operative field was prepped with Chlorhexidine in a sterile fashion, and a sterile drape was applied covering the operative field. A sterile gown and sterile gloves were used for the procedure. Local anesthesia was provided with 1% Lidocaine.  Utilizing CT guidance and a quick stick technique a 10 French drainage catheter was placed into the right pericolic fluid collection without difficulty. Turbid serous fluid was withdrawn without difficulty and sent for laboratory evaluation. Catheter was then sutured into place utilizing 0 Prolene and placed to an external suction grenade. The catheter was then dressed in the standard manner. The patient tolerated the procedure well and was returned to his room in satisfactory condition. FINDINGS: Stable air-fluid collection in the right pericolic gutter similar to that seen on recent CT examination. IMPRESSION: Successful placement of a drainage catheter under CT guidance as described. Electronically Signed   By: Inez Catalina M.D.   On: 09/29/2015 16:51     CBC  Recent Labs Lab 09/24/15 0947  09/27/15 0450 09/28/15 0434 09/29/15 0500 09/30/15 0528 10/01/15 0556  WBC 18.3*  < > 13.9* 11.8* 10.9* 8.0 7.8  HGB 12.3*  < > 9.5* 9.6* 9.5* 9.3* 9.3*  HCT 37.1*  < > 29.1* 29.6* 28.6* 28.2* 28.6*  PLT 489*  < > 272 298 296 291 313  MCV 84.2  < > 83.8 85.0 83.7 83.7 83.4  MCH 28.0  < > 27.5 27.5 27.7 27.7 27.2  MCHC 33.3  < > 32.8 32.3 33.1 33.1 32.6  RDW 15.7*  < > 15.6* 15.8* 15.4*  15.3* 15.5*  LYMPHSABS 1.1  --   --  0.4*  --   --   --   MONOABS 0.7  --   --  0.6  --   --   --   EOSABS 0.0  --   --  0.1  --   --   --   BASOSABS 0.1  --   --  0.0  --   --   --   < > = values in this interval not displayed.  Chemistries   Recent Labs Lab 09/24/15 0947  09/27/15 0450 09/28/15 0434 09/29/15 0500 09/30/15 0528 10/01/15 0556  NA 137  < > 138 133* 139 141 141  K 3.9  < > 3.9 4.3 3.8 3.4* 3.4*  CL 99*  < > 105 103 106 106 102  CO2 22  < > '24 24 27 30 '$ 32  GLUCOSE 166*  < > 217* 148* 130* 124* 124*  BUN 29*  < > 31* 22* '19 15 14  '$ CREATININE 2.15*  < > 1.11 0.87 0.78 0.84 0.90  CALCIUM 9.0  < > 7.8* 7.5* 8.0* 8.0* 8.1*  MG  --   --  2.4  --  2.0 1.8  --   AST 36  --   --   --   --   --    --   ALT 20  --   --   --   --   --   --   ALKPHOS 55  --   --   --   --   --   --   BILITOT 1.3*  --   --   --   --   --   --   < > = values in this interval not displayed. ------------------------------------------------------------------------------------------------------------------ estimated creatinine clearance is 65.6 mL/min (by C-G formula based on Cr of 0.9). ------------------------------------------------------------------------------------------------------------------ No results for input(s): HGBA1C in the last 72 hours. ------------------------------------------------------------------------------------------------------------------ No results for input(s): CHOL, HDL, LDLCALC, TRIG, CHOLHDL, LDLDIRECT in the last 72 hours. ------------------------------------------------------------------------------------------------------------------ No results for input(s): TSH, T4TOTAL, T3FREE, THYROIDAB in the last 72 hours.  Invalid input(s): FREET3 ------------------------------------------------------------------------------------------------------------------ No results for input(s): VITAMINB12, FOLATE, FERRITIN, TIBC, IRON, RETICCTPCT in the last 72 hours.  Coagulation profile  Recent Labs Lab 09/29/15 1430  INR 1.28    No results for input(s): DDIMER in the last 72 hours.  Cardiac Enzymes  Recent Labs Lab 09/25/15 0224 09/25/15 0828 09/27/15 0450  TROPONINI 6.59* 6.77* 5.86*   ------------------------------------------------------------------------------------------------------------------ Invalid input(s): POCBNP    Assessment & Plan   82 year old male status post colon resection and postoperative inferior ST elevation MI who presents with abdominal pain which is now subsided but now has acute renal failure.  1. ventricular tachycardia: Continue amiodarone therapy no further arrhythmias  2. Shortness of breath suspect due to fluid overload with acute systolic  CHF continue IV Lasix patient responding well to diuresis continue his swelling is improved  3. Abdominal pain: Has a fluid collection in the abdomen status post drain placed continue IV antibiotics    5. Recent ST elevation MI: Patient has a drug-eluting stent in RCA. Continue Plavix and aspirin  6. Hypotension: resolved  7. Type 2 diabetes: Blood sugars stable continue sliding scale for now  8. Upper extremity swelling: Doppler of the upper extremity    Code Status Orders        Start     Ordered   09/24/15 1846  Full code   Continuous  09/24/15 1845    Code Status History    Date Active Date Inactive Code Status Order ID Comments User Context   09/17/2015  2:05 PM 09/21/2015  3:59 PM Full Code [id number]  Wellington Hampshire, MD Inpatient   09/14/2015  4:25 PM 09/17/2015  2:05 PM Full Code [id number]  Leonie Green, MD Inpatient    Advance Directive Documentation        Most Recent Value   Type of Advance Directive  Living will   Pre-existing out of facility DNR order (yellow form or pink MOST form)     "MOST" Form in Place?             Consults surgery cardiology DVT Prophylaxis  Lovenox   Lab Results  Component Value Date   PLT 313 10/01/2015     Time Spent in minutes 32 minutes spent     Dustin Flock M.D on 10/01/2015 at 8:45 AM  Between 7am to 6pm - Pager - [phone]  After 6pm go to [email or web address] - password [email or web address] [facility]   Office  [phone]

## 2015-10-01 NOTE — Progress Notes (Signed)
He was seen a second time early in the afternoon and appeared to be making progress. He was tolerating a solid foods. Ambulating better in the hallway.  Diagnosis intra-abdominal abscess which is improving.   Culture reports are out. May be able to change to intravenous Unasyn. May be able to go home on February 6 on oral ampicillin.

## 2015-10-01 NOTE — Progress Notes (Signed)
He reports feeling better today. He has been taking some solid food. He has ambulated in the hallway.  Vital signs stable  His central line dressing was loose and will need to be changed.  Abdomen is soft with minimal right lower quadrant tenderness. There is less drainage in the collection container   Culture demonstrated moderate growth of Escherichia coli and moderate growth of Enterococcus faecalis.  Diagnosis intra-abdominal abscess.  Consider approximately 3 more days of intravenous antibiotics. Consider changing to Unasyn. May subsequently be able to go home on oral ampicillin

## 2015-10-01 NOTE — Progress Notes (Signed)
Patient has requested for the bed alarm to no longer be turned on and that his wife will assist him when getting up. Educated on risks and has agreed to have alarms turned on when wife is not present. Nursing staff will continue to monitor. Earleen Reaper, RN

## 2015-10-01 NOTE — Progress Notes (Signed)
Patient rested quietly tonight. Pain managed with PRN oxy. Wife at beside, A&Ox4, VSS. Emptied 95m of yellow, cloudy fluid from JP bulb. Nursing staff will continue to monitor. KEarleen Reaper RN

## 2015-10-01 NOTE — Care Management (Signed)
While CM was speaking with patient and his wife about the possibility of home IV antibiotic therapy, the surgeon came in and stated that most likely will not need home IV antibiotic therapy.  Sensitivities are sensitive to ampicillin.  Patient and wife very relieved.  Both were adamant about not going to a skilled nursing facility but wife was fearful of administering iv meds.  CM was later contacted by Clair Gulling with [employer] discussing and order her had received for a life vest from Dr Clayborn Bigness.  Says the order is incomplete.  Asks if hospital ist would complete order.  Hospitalist was not aware of the order, has not spoken to the cardiologist and declines to complete order.  It is not known if the patient has been informed of need for the vest and it is documented in a care team note that patient can not discharge home without the vest.  It is being ordered per Clair Gulling for patient's episodes of V tach.  Faxed requested informed.  Informed that this will require auth by [facility] and under a new process, from [place] can not request auth.  Faxed CM form from [employer] to complete.  CM will have to follow up on this new process at a later time as it has not been a responsibility in the past for the CM to obtain authorization.  Competed the form and faxed

## 2015-10-02 LAB — GLUCOSE, CAPILLARY
GLUCOSE-CAPILLARY: 80 mg/dL (ref 65–99)
Glucose-Capillary: 110 mg/dL — ABNORMAL HIGH (ref 65–99)
Glucose-Capillary: 157 mg/dL — ABNORMAL HIGH (ref 65–99)
Glucose-Capillary: 180 mg/dL — ABNORMAL HIGH (ref 65–99)

## 2015-10-02 LAB — BASIC METABOLIC PANEL
ANION GAP: 7 (ref 5–15)
BUN: 12 mg/dL (ref 6–20)
CALCIUM: 8.3 mg/dL — AB (ref 8.9–10.3)
CHLORIDE: 99 mmol/L — AB (ref 101–111)
CO2: 34 mmol/L — AB (ref 22–32)
Creatinine, Ser: 0.85 mg/dL (ref 0.61–1.24)
GFR calc Af Amer: >60 mL/min (ref >60)
GFR calc non Af Amer: >60 mL/min (ref >60)
GLUCOSE: 112 mg/dL — AB (ref 65–99)
Potassium: 3.9 mmol/L (ref 3.5–5.1)
Sodium: 140 mmol/L (ref 135–145)

## 2015-10-02 MED ORDER — FUROSEMIDE 20 MG PO TABS
20.0000 mg | ORAL_TABLET | Freq: Two times a day (BID) | ORAL | Status: DC
  Administered 2015-10-02 – 2015-10-03 (×3): 20 mg via ORAL
  Filled 2015-10-02 (×3): qty 1

## 2015-10-02 NOTE — Progress Notes (Signed)
[facility]                                                                                                                                                                                            Patient Demographics   Nicholas Parker, is a 82 y.o. male, DOB - February 10, 1934, HGD:[id number]  Admit date - 09/24/2015   Admitting Physician Bettey Costa, MD  Outpatient Primary MD for the patient is BERT Briscoe Burns III, MD   LOS - 8  Subjective: Patient says he feels better. Less abdominal pain. Less shortness of breath. No other complaints. Tolerating the diet.   Review of Systems:   CONSTITUTIONAL: No documented fever. No fatigue, weakness. No weight gain, no weight loss.  EYES: No blurry or double vision.  ENT: No tinnitus. No postnasal drip. No redness of the oropharynx.  RESPIRATORY: No cough, no wheeze, no hemoptysis. Positive dyspnea.  CARDIOVASCULAR: No chest pain. No orthopnea. No palpitations. No syncope.  GASTROINTESTINAL:  Abdominal pain is improving. GENITOURINARY: No dysuria or hematuria.  ENDOCRINE: No polyuria or nocturia. No heat or cold intolerance.  HEMATOLOGY: No anemia. No bruising. No bleeding.  INTEGUMENTARY: No rashes. No lesions.  MUSCULOSKELETAL: No arthritis. No swelling. No gout.  NEUROLOGIC: No numbness, tingling, or ataxia. No seizure-type activity.  PSYCHIATRIC: No anxiety. No insomnia. No ADD.    Vitals:   Filed Vitals:   10/01/15 1959 10/02/15 0522 10/02/15 0900 10/02/15 1114  BP: 122/45 103/56 106/48 116/53  Pulse: 76 73 76 80  Temp:  98.4 F (36.9 C)  98 F (36.7 C)  TempSrc:  Oral  Oral  Resp:  18  22  Height:      Weight:      SpO2:  95%  97%    Wt Readings from Last 3 Encounters:  09/24/15 81.2 kg (179 lb 0.2 oz)  09/18/15 84.823 kg (187 lb)  09/06/15 81.647 kg (180 lb)     Intake/Output Summary (Last 24 hours) at 10/02/15 1137 Last data filed at 10/02/15 1008  Gross per 24 hour  Intake     580 ml  Output   1240 ml  Net   -660 ml    Physical Exam:   GENERAL: Pleasant-appearing in no apparent distress.  HEAD, EYES, EARS, NOSE AND THROAT: Atraumatic, normocephalic. Extraocular muscles are intact. Pupils equal and reactive to light. Sclerae anicteric. No conjunctival injection. No oro-pharyngeal erythema.  NECK: Supple. There is no jugular venous distention. No bruits, no lymphadenopathy, no thyromegaly.  HEART: Regular rate and rhythm,. No murmurs, no rubs, no clicks.  LUNGS: Clear to auscultation  bilaterally. No rales or rhonchi. No wheezes.  ABDOMEN: Soft ,nontender, nondistended ,staples are present that are clean.   jp drain in place EXTREMITIES: 1+ edema in the lower extremity,  NEUROLOGIC: The patient is alert, awake, and oriented x3 with no focal motor or sensory deficits appreciated bilaterally.  SKIN: Moist and warm with no rashes appreciated.  Psych: Not anxious, depressed LN: No inguinal LN enlargement    Antibiotics   Anti-infectives    Start     Dose/Rate Route Frequency Ordered Stop   09/29/15 1400  meropenem (MERREM) 1 g in sodium chloride 0.9 % 100 mL IVPB     1 g 200 mL/hr over 30 Minutes Intravenous 3 times per day 09/29/15 1353     09/27/15 2200  meropenem (MERREM) 1 g in sodium chloride 0.9 % 100 mL IVPB  Status:  Discontinued     1 g 200 mL/hr over 30 Minutes Intravenous 3 times per day 09/27/15 1239 09/27/15 1412   09/27/15 1800  cefTRIAXone (ROCEPHIN) 2 g in dextrose 5 % 50 mL IVPB  Status:  Discontinued     2 g 100 mL/hr over 30 Minutes Intravenous Every 24 hours 09/27/15 1449 09/29/15 1352   09/26/15 1400  meropenem (MERREM) 1 g in sodium chloride 0.9 % 100 mL IVPB  Status:  Discontinued     1 g 200 mL/hr over 30 Minutes Intravenous 3 times per day 09/26/15 1124 09/26/15 1126   09/26/15 1130  meropenem (MERREM) 1 g in sodium chloride 0.9 % 100 mL IVPB  Status:  Discontinued     1 g 200 mL/hr over 30 Minutes Intravenous Every 12 hours 09/26/15  1126 09/27/15 1239      Medications   Scheduled Meds: . amiodarone  400 mg Oral Daily  . aspirin EC  81 mg Oral Daily  . clopidogrel  75 mg Oral Q breakfast  . cyanocobalamin  250 mcg Oral Daily  . enoxaparin (LOVENOX) injection  40 mg Subcutaneous Q24H  . furosemide  20 mg Intravenous Q12H  . GLUCERNA  237 mL Oral TID  . insulin aspart  0-15 Units Subcutaneous TID WC  . insulin aspart  0-5 Units Subcutaneous QHS  . loratadine  10 mg Oral Daily  . meropenem (MERREM) IV  1 g Intravenous 3 times per day  . metoCLOPramide  5 mg Oral TID AC & HS  . pantoprazole  40 mg Oral Daily  . senna-docusate  1 tablet Oral BID  . simethicone  80 mg Oral QID  . simvastatin  40 mg Oral q1800  . sodium chloride flush  10-40 mL Intracatheter Q12H  . sodium chloride flush  3 mL Intravenous Q12H   Continuous Infusions: . lidocaine Stopped (09/28/15 1537)   PRN Meds:.acetaminophen **OR** acetaminophen, alum & mag hydroxide-simeth, morphine injection, ondansetron **OR** ondansetron (ZOFRAN) IV, oxyCODONE-acetaminophen, phenol, sodium chloride, sodium chloride flush   Data Review:   Micro Results Recent Results (from the past 240 hour(s))  CULTURE, BLOOD (ROUTINE X 2) w Reflex to PCR ID Panel     Status: None   Collection Time: 09/26/15 11:34 AM  Result Value Ref Range Status   Specimen Description BLOOD LEFT HAND  Final   Special Requests   Final    BOTTLES DRAWN AEROBIC AND ANAEROBIC  AER 1CC ANA 3CC   Culture NO GROWTH 5 DAYS  Final   Report Status 10/01/2015 FINAL  Final  CULTURE, BLOOD (ROUTINE X 2) w Reflex to PCR ID Panel  Status: None   Collection Time: 09/26/15 11:35 AM  Result Value Ref Range Status   Specimen Description BLOOD LEFT ARM  Final   Special Requests BOTTLES DRAWN AEROBIC AND ANAEROBIC  10CC  Final   Culture NO GROWTH 5 DAYS  Final   Report Status 10/01/2015 FINAL  Final  MRSA PCR Screening     Status: None   Collection Time: 09/27/15  9:15 AM  Result Value Ref  Range Status   MRSA by PCR NEGATIVE NEGATIVE Final    Comment:        The GeneXpert MRSA Assay (FDA approved for NASAL specimens only), is one component of a comprehensive MRSA colonization surveillance program. It is not intended to diagnose MRSA infection nor to guide or monitor treatment for MRSA infections.   Culture, routine-abscess     Status: None (Preliminary result)   Collection Time: 09/29/15  5:11 PM  Result Value Ref Range Status   Specimen Description BIOPSY  Final   Special Requests Normal  Final   Gram Stain PENDING  Incomplete   Culture   Final    MODERATE GROWTH ESCHERICHIA COLI MODERATE GROWTH ENTEROCOCCUS FAECALIS    Report Status PENDING  Incomplete   Organism ID, Bacteria ESCHERICHIA COLI  Final   Organism ID, Bacteria ENTEROCOCCUS FAECALIS  Final      Susceptibility   Escherichia coli - MIC*    AMPICILLIN Value in next row Sensitive      SENSITIVE<=2    CEFAZOLIN Value in next row Sensitive      SENSITIVE<=4    CEFEPIME Value in next row Sensitive      SENSITIVE<=1    CEFTAZIDIME Value in next row Sensitive      SENSITIVE<=1    CEFTRIAXONE Value in next row Sensitive      SENSITIVE<=1    CIPROFLOXACIN Value in next row Sensitive      SENSITIVE<=0.25    GENTAMICIN Value in next row Sensitive      SENSITIVE<=1    IMIPENEM Value in next row Sensitive      SENSITIVE<=0.25    TRIMETH/SULFA Value in next row Sensitive      SENSITIVE<=20    AMPICILLIN/SULBACTAM Value in next row Sensitive      SENSITIVE<=2    PIP/TAZO Value in next row Sensitive      SENSITIVE<=4    Extended ESBL Value in next row Sensitive      SENSITIVE<=4    * MODERATE GROWTH ESCHERICHIA COLI   Enterococcus faecalis - MIC*    AMPICILLIN Value in next row Sensitive      SENSITIVE<=2    VANCOMYCIN Value in next row Sensitive      SENSITIVE1    GENTAMICIN SYNERGY Value in next row Sensitive      SENSITIVE1    LINEZOLID Value in next row Sensitive      SENSITIVE2    *  MODERATE GROWTH ENTEROCOCCUS FAECALIS    Radiology Reports Ct Abdomen Pelvis Wo Contrast  09/29/2015  CLINICAL DATA:  Right lower quadrant pain following right colectomy and lysis of adhesions EXAM: CT ABDOMEN AND PELVIS WITHOUT CONTRAST TECHNIQUE: Multidetector CT imaging of the abdomen and pelvis was performed following the standard protocol without IV contrast. COMPARISON:  09/24/2015 FINDINGS: Lung bases demonstrate bilateral pleural effusions. These are new from the prior exam. The liver, gallbladder, spleen, adrenal glands and pancreas are all stable from the prior exam. Multiple gallstones are again seen within the gallbladder. The kidneys demonstrate renal vascular calcifications  although no calculi are identified. No obstructive changes are seen. Changes consistent with prior aortic stent graft repair are noted. No expansion of the aneurysm sac is seen. The bladder is well distended. There are again noted postsurgical changes consistent with prior distal colectomy as well as recent right colectomy. Contrast material is noted within the colon without significant small bowel dilatation indicating a patent anastomosis. In the right mid abdomen however extending from the tip of the liver there is a complex fluid collection identified which extends for approximately 18 cm along the right pericolic gutter. It measures approximately 5.6 cm in greatest AP dimension and approximately 7.7 cm in greatest transverse dimension. This has increased in the interval from the prior exam and is consistent with postoperative abscess. Within the fluid collection there is some mottled air suggesting fecal material although to this is uncertain on the basis of this exam. A few other areas of free fluid are noted particularly surrounding the spleen as well as a few pockets in the left pericolic gutter. These may be sterile and are not significantly increased when compared with the prior exam. The osseous structures are stable.  IMPRESSION: New right lower quadrant abscess as described above. These findings were communicated to Dr. Tamala Julian at time of exam interpretation the patient is planned for percutaneous drainage in the near future. Cholelithiasis without complicating factors. Postsurgical changes as described. Electronically Signed   By: Inez Catalina M.D.   On: 09/29/2015 13:36   Ct Abdomen Pelvis Wo Contrast  09/24/2015  ADDENDUM REPORT: 09/24/2015 13:15 ADDENDUM: It should be noted the kidneys are well visualized and demonstrate no definitive obstructive change. No renal calculi are seen although heavy renal vascular calcification is noted. Electronically Signed   By: Inez Catalina M.D.   On: 09/24/2015 13:15  09/24/2015  CLINICAL DATA:  Postop abdominal pain, history of recent colonic surgery EXAM: CT ABDOMEN AND PELVIS WITHOUT CONTRAST TECHNIQUE: Multidetector CT imaging of the abdomen and pelvis was performed following the standard protocol without IV contrast. COMPARISON:  04/26/2015. FINDINGS: Lung bases are free of acute infiltrate or sizable effusion. The liver, spleen, adrenal glands and pancreas are all normal in their CT appearance. The gallbladder is well distended and demonstrates multiple dependent gallstones. No definitive pericholecystic fluid is seen. Small amounts of free fluid are noted surrounding the liver and spleen. These changes are likely related to the recent colonic resection. Postsurgical changes are noted in the right colon consistent with the patient's given clinical history. No obstructive changes are seen. A tiny focus of air is noted adjacent to the suture line again likely related to the recent surgery. The anastomosis appears widely patent. Changes of prior sigmoid colectomy are also noted. No significant obstructive changes are noted. Aortic stent graft is noted in satisfactory position. Comparison with prior exam shows a stable appearing aneurysm sac. Iliac calcifications are seen without  aneurysmal dilatation. The bladder is well distended. Prostate calcifications are seen. No significant lymphadenopathy is noted. The osseous structures show degenerative change of the lumbar spine. IMPRESSION: Small amounts of free fluid and free air likely related to the recent colonic surgery. No significant findings to suggest acute perforation are noted at this time. Multiple gallstones without definitive complicating factors. Electronically Signed: By: Inez Catalina M.D. On: 09/24/2015 12:29   Dg Abd 1 View  09/25/2015  CLINICAL DATA:  82 year old male with abdominal distension and pain. Recent colonic resection. EXAM: ABDOMEN - 1 VIEW COMPARISON:  09/24/2015 CT FINDINGS: Gaseous distension of  the colon is noted. There is some oral contrast from recent CT present within the proximal colon. No dilated small bowel loops are noted. Surgical staples and clips are identified as well as an aortic stent graft. IMPRESSION: Gaseous colonic distention likely representing ileus. No dilated small bowel loops identified. Electronically Signed   By: Margarette Canada M.D.   On: 09/25/2015 10:53   US Renal  09/18/2015  CLINICAL DATA:  Acute renal failure. EXAM: RENAL / URINARY TRACT ULTRASOUND COMPLETE COMPARISON:  CT of the abdomen and pelvis 04/26/2015 FINDINGS: Right Kidney: Length: 11.1. Echogenicity within normal limits. No mass or hydronephrosis visualized. Left Kidney: Length: 11.7. Echogenicity within normal limits. No mass or hydronephrosis visualized. There is a questionable exophytic left lower pole renal mass measuring 1.9 x 1.4 x 1.4 cm. Bladder: Appears normal for degree of bladder distention. IMPRESSION: No evidence of hydronephrosis. Questionable exophytic left lower pole renal mass versus complicated cyst measuring 1.9 cm. Attention on follow-up is recommended. Electronically Signed   By: Fidela Salisbury M.D.   On: 09/18/2015 10:27   US Venous Img Upper Uni Left  10/01/2015  CLINICAL DATA:  Left upper  extremity edema. EXAM: LEFT UPPER EXTREMITY VENOUS DOPPLER ULTRASOUND TECHNIQUE: Gray-scale sonography with graded compression, as well as color Doppler and duplex ultrasound were performed to evaluate the upper extremity deep venous system from the level of the subclavian vein and including the jugular, axillary, basilic, radial, ulnar and upper cephalic vein. Spectral Doppler was utilized to evaluate flow at rest and with distal augmentation maneuvers. COMPARISON:  None. FINDINGS: Contralateral Subclavian Vein: Respiratory phasicity is normal and symmetric with the symptomatic side. No evidence of thrombus. Normal compressibility. Internal Jugular Vein: No evidence of thrombus. Normal compressibility, respiratory phasicity and response to augmentation. Subclavian Vein: No evidence of thrombus. Normal compressibility, respiratory phasicity and response to augmentation. Axillary Vein: No evidence of thrombus. Normal compressibility, respiratory phasicity and response to augmentation. Cephalic Vein: No evidence of thrombus. Normal compressibility, respiratory phasicity and response to augmentation. Basilic Vein: No evidence of thrombus. Normal compressibility, respiratory phasicity and response to augmentation. Brachial Veins: No evidence of thrombus. Normal compressibility, respiratory phasicity and response to augmentation. Radial Veins: No evidence of thrombus. Normal compressibility, respiratory phasicity and response to augmentation. Ulnar Veins: No evidence of thrombus. Normal compressibility, respiratory phasicity and response to augmentation. Venous Reflux:  None visualized. Other Findings: No evidence of superficial thrombophlebitis or abnormal fluid collection. IMPRESSION: No evidence of left upper extremity deep venous thrombosis. Electronically Signed   By: Aletta Edouard M.D.   On: 10/01/2015 12:31   Dg Chest Portable 1 View  09/24/2015  CLINICAL DATA:  Central line placement.  NG tube placement  EXAM: PORTABLE CHEST 1 VIEW COMPARISON:  09/24/2015 FINDINGS: Right central line tip is in the SVC. No pneumothorax. NG tube enters the stomach. Heart is upper limits normal in size. Bibasilar opacities, likely atelectasis. Mild vascular congestion. No effusions or acute bony abnormality. IMPRESSION: Right central line tip in the SVC. No pneumothorax. NG tube enters the stomach. Mild vascular congestion and bibasilar atelectasis. Electronically Signed   By: Rolm Baptise M.D.   On: 09/24/2015 18:01   Dg Chest Port 1 View  09/24/2015  CLINICAL DATA:  Recent colon resection, epigastric pain, shortness of breath, last bowel movement yesterday, hypertension, diabetes mellitus, coronary artery disease post angioplasty, hyperlipidemia, GERD EXAM: PORTABLE CHEST 1 VIEW COMPARISON:  Portable exam 1000 hours compared to 04/14/2014 FINDINGS: Enlargement of cardiac silhouette. Atherosclerotic calcification aorta. Mediastinal contours  and pulmonary vascularity normal for technique. Lungs clear. No pleural effusion or pneumothorax. Bones demineralized. IMPRESSION: Minimal enlargement of cardiac silhouette. No acute abnormalities. Electronically Signed   By: Lavonia Dana M.D.   On: 09/24/2015 10:25   Dg Abd Acute W/chest  09/27/2015  CLINICAL DATA:  Abdominal pain.  Recent partial colectomy EXAM: DG ABDOMEN ACUTE W/ 1V CHEST COMPARISON:  Two days ago FINDINGS: Normal heart size. Negative aortic and hilar contours. Right IJ central line, tip at the SVC level. COPD changes. There is no edema, consolidation, effusion, or pneumothorax. Unchanged gaseous distention of colon, greatest in the left abdomen. Gas and stool reaches the rectum. No pneumoperitoneum. No evidence of pneumatosis. Aortoiliac stenting. IMPRESSION: Unchanged gaseous distention of the postoperative colon, likely adynamic ileus. Electronically Signed   By: Monte Fantasia M.D.   On: 09/27/2015 05:56   Ct Image Guided Drainage By Percutaneous  Catheter  09/29/2015  INDICATION: History of recent right colectomy with enlarging right pericolic fluid collection now with air within the suggestive of intra-abdominal abscess. EXAM: CT GUIDED DRAINAGE OF RIGHT PERICOLIC ABSCESS MEDICATIONS: The patient is currently admitted to the hospital and receiving intravenous antibiotics. The antibiotics were administered within an appropriate time frame prior to the initiation of the procedure. ANESTHESIA/SEDATION: NONE COMPLICATIONS: None immediate. TECHNIQUE: Informed written consent was obtained from the patient after a thorough discussion of the procedural risks, benefits and alternatives. All questions were addressed. Maximal Sterile Barrier Technique was utilized including caps, mask, sterile gowns, sterile gloves, sterile drape, hand hygiene and skin antiseptic. A timeout was performed prior to the initiation of the procedure. PROCEDURE: The operative field was prepped with Chlorhexidine in a sterile fashion, and a sterile drape was applied covering the operative field. A sterile gown and sterile gloves were used for the procedure. Local anesthesia was provided with 1% Lidocaine. Utilizing CT guidance and a quick stick technique a 10 French drainage catheter was placed into the right pericolic fluid collection without difficulty. Turbid serous fluid was withdrawn without difficulty and sent for laboratory evaluation. Catheter was then sutured into place utilizing 0 Prolene and placed to an external suction grenade. The catheter was then dressed in the standard manner. The patient tolerated the procedure well and was returned to his room in satisfactory condition. FINDINGS: Stable air-fluid collection in the right pericolic gutter similar to that seen on recent CT examination. IMPRESSION: Successful placement of a drainage catheter under CT guidance as described. Electronically Signed   By: Inez Catalina M.D.   On: 09/29/2015 16:51     CBC  Recent Labs Lab  09/27/15 0450 09/28/15 0434 09/29/15 0500 09/30/15 0528 10/01/15 0556  WBC 13.9* 11.8* 10.9* 8.0 7.8  HGB 9.5* 9.6* 9.5* 9.3* 9.3*  HCT 29.1* 29.6* 28.6* 28.2* 28.6*  PLT 272 298 296 291 313  MCV 83.8 85.0 83.7 83.7 83.4  MCH 27.5 27.5 27.7 27.7 27.2  MCHC 32.8 32.3 33.1 33.1 32.6  RDW 15.6* 15.8* 15.4* 15.3* 15.5*  LYMPHSABS  --  0.4*  --   --   --   MONOABS  --  0.6  --   --   --   EOSABS  --  0.1  --   --   --   BASOSABS  --  0.0  --   --   --     Chemistries   Recent Labs Lab 09/27/15 0450 09/28/15 0434 09/29/15 0500 09/30/15 0528 10/01/15 0556 10/02/15 0555  NA 138 133* 139 141 141 140  K 3.9 4.3 3.8 3.4* 3.4* 3.9  CL 105 103 106 106 102 99*  CO2 '24 24 27 30 '$ 32 34*  GLUCOSE 217* 148* 130* 124* 124* 112*  BUN 31* 22* '19 15 14 12  '$ CREATININE 1.11 0.87 0.78 0.84 0.90 0.85  CALCIUM 7.8* 7.5* 8.0* 8.0* 8.1* 8.3*  MG 2.4  --  2.0 1.8  --   --    ------------------------------------------------------------------------------------------------------------------ estimated creatinine clearance is 69.4 mL/min (by C-G formula based on Cr of 0.85). ------------------------------------------------------------------------------------------------------------------ No results for input(s): HGBA1C in the last 72 hours. ------------------------------------------------------------------------------------------------------------------ No results for input(s): CHOL, HDL, LDLCALC, TRIG, CHOLHDL, LDLDIRECT in the last 72 hours. ------------------------------------------------------------------------------------------------------------------ No results for input(s): TSH, T4TOTAL, T3FREE, THYROIDAB in the last 72 hours.  Invalid input(s): FREET3 ------------------------------------------------------------------------------------------------------------------ No results for input(s): VITAMINB12, FOLATE, FERRITIN, TIBC, IRON, RETICCTPCT in the last 72 hours.  Coagulation  profile  Recent Labs Lab 09/29/15 1430  INR 1.28    No results for input(s): DDIMER in the last 72 hours.  Cardiac Enzymes  Recent Labs Lab 09/27/15 0450  TROPONINI 5.86*   ------------------------------------------------------------------------------------------------------------------ Invalid input(s): POCBNP    Assessment & Plan   82 year old male status post colon resection and postoperative inferior ST elevation MI who presents with abdominal pain which is now subsided but now has acute renal failure.  1. Nonsustained ventricular tachycardia: Continue amiodarone therapy no further arrhythmias, acute cardiology input regarding LifeVest.  2. Shortness of breath suspect due to fluid overload with acute systolic CHF 'symptoms are better. Change the Lasix from IV to by mouth.   acute on chronic systolic heart failure. EF 35-40% by recent echocardiogram.  3. Abdominal abscess, status post drainage culture showing Escherichia coli and enterococcus faecalis. Patient is on IV meropenem. According to surgery continue 3 more days of IV antibiotics, changed to by mouth ampicillin on sixth of February. Discussed with the patient and patient's wife. They're agreeable to it.  5. Recent ST elevation MI: Patient has a drug-eluting stent in RCA. Continue Plavix and aspirin  6. Hypotension: resolved  7. Type 2 diabetes: Blood sugars stable continue sliding scale for now  8. Upper extremity swelling: Doppler of the upper extremity is negative for DVT. No swelling is improved. Elevate arm.     Code Status Orders        Start     Ordered   09/24/15 1846  Full code   Continuous     09/24/15 1845    Code Status History    Date Active Date Inactive Code Status Order ID Comments User Context   09/17/2015  2:05 PM 09/21/2015  3:59 PM Full Code [id number]  Wellington Hampshire, MD Inpatient   09/14/2015  4:25 PM 09/17/2015  2:05 PM Full Code [id number]  Leonie Green, MD Inpatient     Advance Directive Documentation        Most Recent Value   Type of Advance Directive  Living will   Pre-existing out of facility DNR order (yellow form or pink MOST form)     "MOST" Form in Place?             Consults surgery cardiology DVT Prophylaxis  Lovenox   Lab Results  Component Value Date   PLT 313 10/01/2015     Time Spent in minutes 32 minutes spent     [facility] M.D on 10/02/2015 at 11:37 AM  Between 7am to 6pm - Pager - [phone]  After 6pm go to [email or web address] - [place]  [facility]   Office  [phone]

## 2015-10-02 NOTE — Progress Notes (Signed)
He reports continued improvement.  He is tolerating solid food.  He says he is moving his bowels like he usually does which is not every day.  He is walking better in the hallway.  Vital signs stable  He is awake alert and oriented.  Abdomen is soft with no significant tenderness.  His drain dressing is dry.  The drain collected 20 cc over 24 hours.  Culture report noted.  Impression resolving intra-abdominal abscess  Anticipate 2 more days of intravenous antibiotics and then go home on 7 days of oral ampicillin.  Will likely remove his drain before discharge.  He was encouraged to continue to increase his walking.  Will order TEDs stockings

## 2015-10-03 LAB — GLUCOSE, CAPILLARY
GLUCOSE-CAPILLARY: 89 mg/dL (ref 65–99)
GLUCOSE-CAPILLARY: 124 mg/dL — AB (ref 65–99)
GLUCOSE-CAPILLARY: 102 mg/dL — AB (ref 65–99)
GLUCOSE-CAPILLARY: 102 mg/dL — AB (ref 65–99)

## 2015-10-03 LAB — CBC
HCT: 29.2 % — ABNORMAL LOW (ref 40.0–52.0)
Hemoglobin: 9.4 g/dL — ABNORMAL LOW (ref 13.0–18.0)
MCH: 27.4 pg (ref 26.0–34.0)
MCHC: 32.2 g/dL (ref 32.0–36.0)
MCV: 84.9 fL (ref 80.0–100.0)
Platelets: 302 10*3/uL (ref 150–440)
RBC: 3.43 MIL/uL — ABNORMAL LOW (ref 4.40–5.90)
RDW: 15.1 % — AB (ref 11.5–14.5)
WBC: 7.8 10*3/uL (ref 3.8–10.6)

## 2015-10-03 MED ORDER — AMPICILLIN 250 MG PO CAPS: 250.0000 mg | ORAL_CAPSULE | Freq: Four times a day (QID) | ORAL | Status: DC

## 2015-10-03 NOTE — Progress Notes (Signed)
Patient refuses bed alarm when his wife is in room

## 2015-10-03 NOTE — Progress Notes (Signed)
[place] at [facility]                                                                                                                                                                                            Patient Demographics   Nicholas Parker, is a 82 y.o. male, DOB - 07/23/1934, QHU:[id number]  Admit date - 09/24/2015   Admitting Physician Bettey Costa, MD  Outpatient Primary MD for the patient is BERT Briscoe Burns III, MD   LOS - 9  Subjective: Denies any complaints. Ambulating well, no fever.   Review of Systems:   CONSTITUTIONAL: No documented fever. No fatigue, weakness. No weight gain, no weight loss.  EYES: No blurry or double vision.  ENT: No tinnitus. No postnasal drip. No redness of the oropharynx.  RESPIRATORY: No cough, no wheeze, no hemoptysis. Positive dyspnea.  CARDIOVASCULAR: No chest pain. No orthopnea. No palpitations. No syncope.  GASTROINTESTINAL:  Abdominal pain is improving. JP drain in place .drainage is coming down. GENITOURINARY: No dysuria or hematuria.  ENDOCRINE: No polyuria or nocturia. No heat or cold intolerance.  HEMATOLOGY: No anemia. No bruising. No bleeding.  INTEGUMENTARY: No rashes. No lesions.  MUSCULOSKELETAL: No arthritis. No swelling. No gout.  NEUROLOGIC: No numbness, tingling, or ataxia. No seizure-type activity.  PSYCHIATRIC: No anxiety. No insomnia. No ADD.    Vitals:   Filed Vitals:   10/02/15 1114 10/02/15 2022 10/03/15 0426 10/03/15 1107  BP: 116/53 110/58 110/57 105/66  Pulse: 80 72 69 72  Temp: 98 F (36.7 C) 98.2 F (36.8 C) 98.3 F (36.8 C) 97.8 F (36.6 C)  TempSrc: Oral Oral Oral Oral  Resp: '22 18  22  '$ Height:      Weight:      SpO2: 97% 97% 95% 98%    Wt Readings from Last 3 Encounters:  09/24/15 81.2 kg (179 lb 0.2 oz)  09/18/15 84.823 kg (187 lb)  09/06/15 81.647 kg (180 lb)     Intake/Output Summary (Last 24 hours) at 10/03/15 1139 Last data filed at 10/03/15 1042  Gross  per 24 hour  Intake    600 ml  Output   2045 ml  Net  -1445 ml    Physical Exam:   GENERAL: Pleasant-appearing in no apparent distress.  HEAD, EYES, EARS, NOSE AND THROAT: Atraumatic, normocephalic. Extraocular muscles are intact. Pupils equal and reactive to light. Sclerae anicteric. No conjunctival injection. No oro-pharyngeal erythema.  NECK: Supple. There is no jugular venous distention. No bruits, no lymphadenopathy, no thyromegaly.  HEART: Regular rate and rhythm,. No murmurs, no rubs, no clicks.  LUNGS: Clear  to auscultation bilaterally. No rales or rhonchi. No wheezes.  ABDOMEN: Soft ,nontender, nondistended ,staples are present that are clean.   jp drain in place, 40  ML of  Serous fluid drained from JP drain in the last 24 hours. EXTREMITIES: 1+ edema in the lower extremity,  NEUROLOGIC: The patient is alert, awake, and oriented x3 with no focal motor or sensory deficits appreciated bilaterally.  SKIN: Moist and warm with no rashes appreciated.  Psych: Not anxious, depressed LN: No inguinal LN enlargement    Antibiotics   Anti-infectives    Start     Dose/Rate Route Frequency Ordered Stop   10/03/15 1200  ampicillin (PRINCIPEN) capsule 250 mg     250 mg Oral 4 times per day 10/03/15 0957     09/29/15 1400  meropenem (MERREM) 1 g in sodium chloride 0.9 % 100 mL IVPB     1 g 200 mL/hr over 30 Minutes Intravenous 3 times per day 09/29/15 1353     09/27/15 2200  meropenem (MERREM) 1 g in sodium chloride 0.9 % 100 mL IVPB  Status:  Discontinued     1 g 200 mL/hr over 30 Minutes Intravenous 3 times per day 09/27/15 1239 09/27/15 1412   09/27/15 1800  cefTRIAXone (ROCEPHIN) 2 g in dextrose 5 % 50 mL IVPB  Status:  Discontinued     2 g 100 mL/hr over 30 Minutes Intravenous Every 24 hours 09/27/15 1449 09/29/15 1352   09/26/15 1400  meropenem (MERREM) 1 g in sodium chloride 0.9 % 100 mL IVPB  Status:  Discontinued     1 g 200 mL/hr over 30 Minutes Intravenous 3 times per day  09/26/15 1124 09/26/15 1126   09/26/15 1130  meropenem (MERREM) 1 g in sodium chloride 0.9 % 100 mL IVPB  Status:  Discontinued     1 g 200 mL/hr over 30 Minutes Intravenous Every 12 hours 09/26/15 1126 09/27/15 1239      Medications   Scheduled Meds: . amiodarone  400 mg Oral Daily  . ampicillin  250 mg Oral 4 times per day  . aspirin EC  81 mg Oral Daily  . clopidogrel  75 mg Oral Q breakfast  . cyanocobalamin  250 mcg Oral Daily  . enoxaparin (LOVENOX) injection  40 mg Subcutaneous Q24H  . furosemide  20 mg Oral BID  . GLUCERNA  237 mL Oral TID  . insulin aspart  0-15 Units Subcutaneous TID WC  . insulin aspart  0-5 Units Subcutaneous QHS  . loratadine  10 mg Oral Daily  . meropenem (MERREM) IV  1 g Intravenous 3 times per day  . pantoprazole  40 mg Oral Daily  . senna-docusate  1 tablet Oral BID  . simvastatin  40 mg Oral q1800  . sodium chloride flush  10-40 mL Intracatheter Q12H  . sodium chloride flush  3 mL Intravenous Q12H   Continuous Infusions: . lidocaine Stopped (09/28/15 1537)   PRN Meds:.acetaminophen **OR** acetaminophen, alum & mag hydroxide-simeth, morphine injection, ondansetron **OR** ondansetron (ZOFRAN) IV, oxyCODONE-acetaminophen, phenol, sodium chloride, sodium chloride flush   Data Review:   Micro Results Recent Results (from the past 240 hour(s))  CULTURE, BLOOD (ROUTINE X 2) w Reflex to PCR ID Panel     Status: None   Collection Time: 09/26/15 11:34 AM  Result Value Ref Range Status   Specimen Description BLOOD LEFT HAND  Final   Special Requests   Final    BOTTLES DRAWN AEROBIC AND ANAEROBIC  AER  1CC ANA 3CC   Culture NO GROWTH 5 DAYS  Final   Report Status 10/01/2015 FINAL  Final  CULTURE, BLOOD (ROUTINE X 2) w Reflex to PCR ID Panel     Status: None   Collection Time: 09/26/15 11:35 AM  Result Value Ref Range Status   Specimen Description BLOOD LEFT ARM  Final   Special Requests BOTTLES DRAWN AEROBIC AND ANAEROBIC  10CC  Final   Culture  NO GROWTH 5 DAYS  Final   Report Status 10/01/2015 FINAL  Final  MRSA PCR Screening     Status: None   Collection Time: 09/27/15  9:15 AM  Result Value Ref Range Status   MRSA by PCR NEGATIVE NEGATIVE Final    Comment:        The GeneXpert MRSA Assay (FDA approved for NASAL specimens only), is one component of a comprehensive MRSA colonization surveillance program. It is not intended to diagnose MRSA infection nor to guide or monitor treatment for MRSA infections.   Culture, routine-abscess     Status: None (Preliminary result)   Collection Time: 09/29/15  5:11 PM  Result Value Ref Range Status   Specimen Description BIOPSY  Final   Special Requests Normal  Final   Gram Stain PENDING  Incomplete   Culture   Final    MODERATE GROWTH ESCHERICHIA COLI MODERATE GROWTH ENTEROCOCCUS FAECALIS    Report Status PENDING  Incomplete   Organism ID, Bacteria ESCHERICHIA COLI  Final   Organism ID, Bacteria ENTEROCOCCUS FAECALIS  Final      Susceptibility   Escherichia coli - MIC*    AMPICILLIN Value in next row Sensitive      SENSITIVE<=2    CEFAZOLIN Value in next row Sensitive      SENSITIVE<=4    CEFEPIME Value in next row Sensitive      SENSITIVE<=1    CEFTAZIDIME Value in next row Sensitive      SENSITIVE<=1    CEFTRIAXONE Value in next row Sensitive      SENSITIVE<=1    CIPROFLOXACIN Value in next row Sensitive      SENSITIVE<=0.25    GENTAMICIN Value in next row Sensitive      SENSITIVE<=1    IMIPENEM Value in next row Sensitive      SENSITIVE<=0.25    TRIMETH/SULFA Value in next row Sensitive      SENSITIVE<=20    AMPICILLIN/SULBACTAM Value in next row Sensitive      SENSITIVE<=2    PIP/TAZO Value in next row Sensitive      SENSITIVE<=4    Extended ESBL Value in next row Sensitive      SENSITIVE<=4    * MODERATE GROWTH ESCHERICHIA COLI   Enterococcus faecalis - MIC*    AMPICILLIN Value in next row Sensitive      SENSITIVE<=2    VANCOMYCIN Value in next row  Sensitive      SENSITIVE1    GENTAMICIN SYNERGY Value in next row Sensitive      SENSITIVE1    LINEZOLID Value in next row Sensitive      SENSITIVE2    * MODERATE GROWTH ENTEROCOCCUS FAECALIS    Radiology Reports Ct Abdomen Pelvis Wo Contrast  09/29/2015  CLINICAL DATA:  Right lower quadrant pain following right colectomy and lysis of adhesions EXAM: CT ABDOMEN AND PELVIS WITHOUT CONTRAST TECHNIQUE: Multidetector CT imaging of the abdomen and pelvis was performed following the standard protocol without IV contrast. COMPARISON:  09/24/2015 FINDINGS: Lung bases demonstrate bilateral pleural effusions.  These are new from the prior exam. The liver, gallbladder, spleen, adrenal glands and pancreas are all stable from the prior exam. Multiple gallstones are again seen within the gallbladder. The kidneys demonstrate renal vascular calcifications although no calculi are identified. No obstructive changes are seen. Changes consistent with prior aortic stent graft repair are noted. No expansion of the aneurysm sac is seen. The bladder is well distended. There are again noted postsurgical changes consistent with prior distal colectomy as well as recent right colectomy. Contrast material is noted within the colon without significant small bowel dilatation indicating a patent anastomosis. In the right mid abdomen however extending from the tip of the liver there is a complex fluid collection identified which extends for approximately 18 cm along the right pericolic gutter. It measures approximately 5.6 cm in greatest AP dimension and approximately 7.7 cm in greatest transverse dimension. This has increased in the interval from the prior exam and is consistent with postoperative abscess. Within the fluid collection there is some mottled air suggesting fecal material although to this is uncertain on the basis of this exam. A few other areas of free fluid are noted particularly surrounding the spleen as well as a few  pockets in the left pericolic gutter. These may be sterile and are not significantly increased when compared with the prior exam. The osseous structures are stable. IMPRESSION: New right lower quadrant abscess as described above. These findings were communicated to Dr. Tamala Julian at time of exam interpretation the patient is planned for percutaneous drainage in the near future. Cholelithiasis without complicating factors. Postsurgical changes as described. Electronically Signed   By: Inez Catalina M.D.   On: 09/29/2015 13:36   Ct Abdomen Pelvis Wo Contrast  09/24/2015  ADDENDUM REPORT: 09/24/2015 13:15 ADDENDUM: It should be noted the kidneys are well visualized and demonstrate no definitive obstructive change. No renal calculi are seen although heavy renal vascular calcification is noted. Electronically Signed   By: Inez Catalina M.D.   On: 09/24/2015 13:15  09/24/2015  CLINICAL DATA:  Postop abdominal pain, history of recent colonic surgery EXAM: CT ABDOMEN AND PELVIS WITHOUT CONTRAST TECHNIQUE: Multidetector CT imaging of the abdomen and pelvis was performed following the standard protocol without IV contrast. COMPARISON:  04/26/2015. FINDINGS: Lung bases are free of acute infiltrate or sizable effusion. The liver, spleen, adrenal glands and pancreas are all normal in their CT appearance. The gallbladder is well distended and demonstrates multiple dependent gallstones. No definitive pericholecystic fluid is seen. Small amounts of free fluid are noted surrounding the liver and spleen. These changes are likely related to the recent colonic resection. Postsurgical changes are noted in the right colon consistent with the patient's given clinical history. No obstructive changes are seen. A tiny focus of air is noted adjacent to the suture line again likely related to the recent surgery. The anastomosis appears widely patent. Changes of prior sigmoid colectomy are also noted. No significant obstructive changes are noted.  Aortic stent graft is noted in satisfactory position. Comparison with prior exam shows a stable appearing aneurysm sac. Iliac calcifications are seen without aneurysmal dilatation. The bladder is well distended. Prostate calcifications are seen. No significant lymphadenopathy is noted. The osseous structures show degenerative change of the lumbar spine. IMPRESSION: Small amounts of free fluid and free air likely related to the recent colonic surgery. No significant findings to suggest acute perforation are noted at this time. Multiple gallstones without definitive complicating factors. Electronically Signed: By: Inez Catalina M.D. On: 09/24/2015  12:29   Dg Abd 1 View  09/25/2015  CLINICAL DATA:  82 year old male with abdominal distension and pain. Recent colonic resection. EXAM: ABDOMEN - 1 VIEW COMPARISON:  09/24/2015 CT FINDINGS: Gaseous distension of the colon is noted. There is some oral contrast from recent CT present within the proximal colon. No dilated small bowel loops are noted. Surgical staples and clips are identified as well as an aortic stent graft. IMPRESSION: Gaseous colonic distention likely representing ileus. No dilated small bowel loops identified. Electronically Signed   By: Margarette Canada M.D.   On: 09/25/2015 10:53   US Renal  09/18/2015  CLINICAL DATA:  Acute renal failure. EXAM: RENAL / URINARY TRACT ULTRASOUND COMPLETE COMPARISON:  CT of the abdomen and pelvis 04/26/2015 FINDINGS: Right Kidney: Length: 11.1. Echogenicity within normal limits. No mass or hydronephrosis visualized. Left Kidney: Length: 11.7. Echogenicity within normal limits. No mass or hydronephrosis visualized. There is a questionable exophytic left lower pole renal mass measuring 1.9 x 1.4 x 1.4 cm. Bladder: Appears normal for degree of bladder distention. IMPRESSION: No evidence of hydronephrosis. Questionable exophytic left lower pole renal mass versus complicated cyst measuring 1.9 cm. Attention on follow-up is  recommended. Electronically Signed   By: Fidela Salisbury M.D.   On: 09/18/2015 10:27   US Venous Img Upper Uni Left  10/01/2015  CLINICAL DATA:  Left upper extremity edema. EXAM: LEFT UPPER EXTREMITY VENOUS DOPPLER ULTRASOUND TECHNIQUE: Gray-scale sonography with graded compression, as well as color Doppler and duplex ultrasound were performed to evaluate the upper extremity deep venous system from the level of the subclavian vein and including the jugular, axillary, basilic, radial, ulnar and upper cephalic vein. Spectral Doppler was utilized to evaluate flow at rest and with distal augmentation maneuvers. COMPARISON:  None. FINDINGS: Contralateral Subclavian Vein: Respiratory phasicity is normal and symmetric with the symptomatic side. No evidence of thrombus. Normal compressibility. Internal Jugular Vein: No evidence of thrombus. Normal compressibility, respiratory phasicity and response to augmentation. Subclavian Vein: No evidence of thrombus. Normal compressibility, respiratory phasicity and response to augmentation. Axillary Vein: No evidence of thrombus. Normal compressibility, respiratory phasicity and response to augmentation. Cephalic Vein: No evidence of thrombus. Normal compressibility, respiratory phasicity and response to augmentation. Basilic Vein: No evidence of thrombus. Normal compressibility, respiratory phasicity and response to augmentation. Brachial Veins: No evidence of thrombus. Normal compressibility, respiratory phasicity and response to augmentation. Radial Veins: No evidence of thrombus. Normal compressibility, respiratory phasicity and response to augmentation. Ulnar Veins: No evidence of thrombus. Normal compressibility, respiratory phasicity and response to augmentation. Venous Reflux:  None visualized. Other Findings: No evidence of superficial thrombophlebitis or abnormal fluid collection. IMPRESSION: No evidence of left upper extremity deep venous thrombosis. Electronically  Signed   By: Aletta Edouard M.D.   On: 10/01/2015 12:31   Dg Chest Portable 1 View  09/24/2015  CLINICAL DATA:  Central line placement.  NG tube placement EXAM: PORTABLE CHEST 1 VIEW COMPARISON:  09/24/2015 FINDINGS: Right central line tip is in the SVC. No pneumothorax. NG tube enters the stomach. Heart is upper limits normal in size. Bibasilar opacities, likely atelectasis. Mild vascular congestion. No effusions or acute bony abnormality. IMPRESSION: Right central line tip in the SVC. No pneumothorax. NG tube enters the stomach. Mild vascular congestion and bibasilar atelectasis. Electronically Signed   By: Rolm Baptise M.D.   On: 09/24/2015 18:01   Dg Chest Port 1 View  09/24/2015  CLINICAL DATA:  Recent colon resection, epigastric pain, shortness of breath, last bowel  movement yesterday, hypertension, diabetes mellitus, coronary artery disease post angioplasty, hyperlipidemia, GERD EXAM: PORTABLE CHEST 1 VIEW COMPARISON:  Portable exam 1000 hours compared to 04/14/2014 FINDINGS: Enlargement of cardiac silhouette. Atherosclerotic calcification aorta. Mediastinal contours and pulmonary vascularity normal for technique. Lungs clear. No pleural effusion or pneumothorax. Bones demineralized. IMPRESSION: Minimal enlargement of cardiac silhouette. No acute abnormalities. Electronically Signed   By: Lavonia Dana M.D.   On: 09/24/2015 10:25   Dg Abd Acute W/chest  09/27/2015  CLINICAL DATA:  Abdominal pain.  Recent partial colectomy EXAM: DG ABDOMEN ACUTE W/ 1V CHEST COMPARISON:  Two days ago FINDINGS: Normal heart size. Negative aortic and hilar contours. Right IJ central line, tip at the SVC level. COPD changes. There is no edema, consolidation, effusion, or pneumothorax. Unchanged gaseous distention of colon, greatest in the left abdomen. Gas and stool reaches the rectum. No pneumoperitoneum. No evidence of pneumatosis. Aortoiliac stenting. IMPRESSION: Unchanged gaseous distention of the postoperative colon,  likely adynamic ileus. Electronically Signed   By: Monte Fantasia M.D.   On: 09/27/2015 05:56   Ct Image Guided Drainage By Percutaneous Catheter  09/29/2015  INDICATION: History of recent right colectomy with enlarging right pericolic fluid collection now with air within the suggestive of intra-abdominal abscess. EXAM: CT GUIDED DRAINAGE OF RIGHT PERICOLIC ABSCESS MEDICATIONS: The patient is currently admitted to the hospital and receiving intravenous antibiotics. The antibiotics were administered within an appropriate time frame prior to the initiation of the procedure. ANESTHESIA/SEDATION: NONE COMPLICATIONS: None immediate. TECHNIQUE: Informed written consent was obtained from the patient after a thorough discussion of the procedural risks, benefits and alternatives. All questions were addressed. Maximal Sterile Barrier Technique was utilized including caps, mask, sterile gowns, sterile gloves, sterile drape, hand hygiene and skin antiseptic. A timeout was performed prior to the initiation of the procedure. PROCEDURE: The operative field was prepped with Chlorhexidine in a sterile fashion, and a sterile drape was applied covering the operative field. A sterile gown and sterile gloves were used for the procedure. Local anesthesia was provided with 1% Lidocaine. Utilizing CT guidance and a quick stick technique a 10 French drainage catheter was placed into the right pericolic fluid collection without difficulty. Turbid serous fluid was withdrawn without difficulty and sent for laboratory evaluation. Catheter was then sutured into place utilizing 0 Prolene and placed to an external suction grenade. The catheter was then dressed in the standard manner. The patient tolerated the procedure well and was returned to his room in satisfactory condition. FINDINGS: Stable air-fluid collection in the right pericolic gutter similar to that seen on recent CT examination. IMPRESSION: Successful placement of a drainage  catheter under CT guidance as described. Electronically Signed   By: Inez Catalina M.D.   On: 09/29/2015 16:51     CBC  Recent Labs Lab 09/28/15 0434 09/29/15 0500 09/30/15 0528 10/01/15 0556 10/03/15 0500  WBC 11.8* 10.9* 8.0 7.8 7.8  HGB 9.6* 9.5* 9.3* 9.3* 9.4*  HCT 29.6* 28.6* 28.2* 28.6* 29.2*  PLT 298 296 291 313 302  MCV 85.0 83.7 83.7 83.4 84.9  MCH 27.5 27.7 27.7 27.2 27.4  MCHC 32.3 33.1 33.1 32.6 32.2  RDW 15.8* 15.4* 15.3* 15.5* 15.1*  LYMPHSABS 0.4*  --   --   --   --   MONOABS 0.6  --   --   --   --   EOSABS 0.1  --   --   --   --   BASOSABS 0.0  --   --   --   --  Chemistries   Recent Labs Lab 09/27/15 0450 09/28/15 0434 09/29/15 0500 09/30/15 0528 10/01/15 0556 10/02/15 0555  NA 138 133* 139 141 141 140  K 3.9 4.3 3.8 3.4* 3.4* 3.9  CL 105 103 106 106 102 99*  CO2 '24 24 27 30 '$ 32 34*  GLUCOSE 217* 148* 130* 124* 124* 112*  BUN 31* 22* '19 15 14 12  '$ CREATININE 1.11 0.87 0.78 0.84 0.90 0.85  CALCIUM 7.8* 7.5* 8.0* 8.0* 8.1* 8.3*  MG 2.4  --  2.0 1.8  --   --    ------------------------------------------------------------------------------------------------------------------ estimated creatinine clearance is 69.4 mL/min (by C-G formula based on Cr of 0.85). ------------------------------------------------------------------------------------------------------------------ No results for input(s): HGBA1C in the last 72 hours. ------------------------------------------------------------------------------------------------------------------ No results for input(s): CHOL, HDL, LDLCALC, TRIG, CHOLHDL, LDLDIRECT in the last 72 hours. ------------------------------------------------------------------------------------------------------------------ No results for input(s): TSH, T4TOTAL, T3FREE, THYROIDAB in the last 72 hours.  Invalid input(s):  FREET3 ------------------------------------------------------------------------------------------------------------------ No results for input(s): VITAMINB12, FOLATE, FERRITIN, TIBC, IRON, RETICCTPCT in the last 72 hours.  Coagulation profile  Recent Labs Lab 09/29/15 1430  INR 1.28    No results for input(s): DDIMER in the last 72 hours.  Cardiac Enzymes  Recent Labs Lab 09/27/15 0450  TROPONINI 5.86*   ------------------------------------------------------------------------------------------------------------------ Invalid input(s): POCBNP    Assessment & Plan   82 year old male status post colon resection and postoperative inferior ST elevation MI who presents with abdominal pain which is now subsided but now has acute renal failure.  1. Nonsustained ventricular tachycardia: Continue amiodarone therapy no further arrhythmias, cardiology input regarding LifeVest.  2. Shortness of breath suspect due to fluid overload with acute systolic CHF 'symptoms are better. Continue by mouth Lasix.Marland Kitchen acute on chronic systolic heart failure. EF 35-40% by recent echocardiogram.  3. Abdominal abscess, status post drainage culture showing Escherichia coli and enterococcus faecalis. Patient is on IV meropenem. Likely discharge tomorrow with by mouth ampicillin. Surgery is following regarding the JP drain. Removal of right IJ line tomorrow. 5. Recent ST elevation MI: Patient has a drug-eluting stent in RCA. Continue Plavix and aspirin  6. Hypotension: resolved  7. Type 2 diabetes: Blood sugars stable continue sliding scale for now  8. Upper extremity swelling: Doppler of the upper extremity is negative for DVT. No swelling is improved. Elevate arm. Encourage ambulation. Discussed the plan of possible discharge tomorrow.    Code Status Orders        Start     Ordered   09/24/15 1846  Full code   Continuous     09/24/15 1845    Code Status History    Date Active Date Inactive Code  Status Order ID Comments User Context   09/17/2015  2:05 PM 09/21/2015  3:59 PM Full Code [id number]  Wellington Hampshire, MD Inpatient   09/14/2015  4:25 PM 09/17/2015  2:05 PM Full Code [id number]  Leonie Green, MD Inpatient    Advance Directive Documentation        Most Recent Value   Type of Advance Directive  Living will   Pre-existing out of facility DNR order (yellow form or pink MOST form)     "MOST" Form in Place?             Consults surgery cardiology DVT Prophylaxis  Lovenox   Lab Results  Component Value Date   PLT 302 10/03/2015     Time Spent in minutes ;25 minutes spent     [facility] M.D on 10/03/2015 at 11:39 AM  Between 7am to  6pm - Pager - [phone]  After 6pm go to [email or web address] - password [email or web address] [facility]   Office  [phone]

## 2015-10-03 NOTE — Progress Notes (Signed)
He reports no abdominal pain. He is eating well, moving his bowels and passing flatus. He has been walking in the hallway this morning.  He is afebrile awake alert and oriented. His abdomen is soft and nontender. His drain dressing is dry and intact. The drainage for 24 hours was 40 cc of a cloudy serous fluid.  Impression resolving intra-abdominal abscess  Anticipate he can go home on oral ampicillin tomorrow  . Will see how much drainage there is over the coming 24 hours and make a decision about when to remove the drain.  Encourage frequent walking and continue current diet.

## 2015-10-04 LAB — CULTURE, ROUTINE-ABSCESS: SPECIAL REQUESTS: NORMAL

## 2015-10-04 LAB — GLUCOSE, CAPILLARY
GLUCOSE-CAPILLARY: 86 mg/dL (ref 65–99)
Glucose-Capillary: 87 mg/dL (ref 65–99)

## 2015-10-04 MED ORDER — AMIODARONE HCL 400 MG PO TABS: 400.0000 mg | ORAL_TABLET | Freq: Every day | ORAL | Status: DC

## 2015-10-04 MED ORDER — AMIODARONE HCL 200 MG PO TABS: 200.0000 mg | ORAL_TABLET | Freq: Two times a day (BID) | ORAL | Status: DC

## 2015-10-04 MED ORDER — FUROSEMIDE 20 MG PO TABS: 20.0000 mg | ORAL_TABLET | Freq: Two times a day (BID) | ORAL | Status: DC

## 2015-10-04 MED ORDER — AMPICILLIN 500 MG PO CAPS: 500.0000 mg | ORAL_CAPSULE | Freq: Four times a day (QID) | ORAL | Status: DC

## 2015-10-04 NOTE — Progress Notes (Signed)
PT Cancellation Note  Patient Details Name: Nicholas Parker MRN: [id number] DOB: [birth year]   Cancelled Treatment:    Reason Eval/Treat Not Completed: Patient declined, no reason specified.  Pt refused any PT today.  Pt and pt's wife adamant that they felt comfortable with pt's walking and were ready to be discharged home today.  Pt was educated on PT recommendations to use a two wheeled rolling walker with ambulation for safety and for a 3 in 1 commode for over toilet.  Pt and pt's wife insistent that they will get home and figure out what they feel they need for equipment and will obtain it themselves.    [name], SPT [name] 10/04/2015, 9:40 AM

## 2015-10-04 NOTE — Discharge Summary (Signed)
Nicholas Parker, is a 82 y.o. male  DOB 03/30/1934  MRN [id number].  Admission date:  09/24/2015  Admitting Physician  Bettey Costa, MD  Discharge Date:  10/04/2015   Primary MD  Bethany III, MD  Recommendations for primary care physician for things to follow:   Follow up  with Dr. Nehemiah Massed in now 2-3 days.   Admission Diagnosis  Renal insufficiency [N28.9]   Discharge Diagnosis  Renal insufficiency [N28.9]  *  Active Problems:   Renal failure      Past Medical History  Diagnosis Date  . Hypertension   . Diabetes mellitus without complication ([place])   . Hyperlipidemia   . Coronary artery disease   . neuropathy   . Low testosterone   . Neuropathy ([place])   . Anemia   . B12 deficiency   . GERD (gastroesophageal reflux disease)   . Skin cancer     Basal Cell  . Adenocarcinoma ([place])   . AAA (abdominal aortic aneurysm) ([place])   . Diverticulosis     Past Surgical History  Procedure Laterality Date  . Arthrosopic rotator cuff repair Left   . Robotic colectomy    . Coronary angioplasty    . Vascular surgery    . Colonoscopy with propofol N/A 04/13/2015    Procedure: COLONOSCOPY WITH PROPOFOL;  Surgeon: Lollie Sails, MD;  Location: [facility] ENDOSCOPY;  Service: Endoscopy;  Laterality: N/A;  . Colonoscopy with propofol N/A 04/14/2015    Procedure: COLONOSCOPY WITH PROPOFOL;  Surgeon: Lollie Sails, MD;  Location: [facility] ENDOSCOPY;  Service: Endoscopy;  Laterality: N/A;  . Colonoscopy with propofol N/A 07/20/2015    Procedure: COLONOSCOPY WITH PROPOFOL;  Surgeon: Lollie Sails, MD;  Location: [facility] ENDOSCOPY;  Service: Endoscopy;  Laterality: N/A;  . Colon surgery    . Eye surgery Bilateral     Cataract Extraction with IOL  . Abdominal aortic aneurysm repair  2014    Dr. Delana Meyer, [facility]  . Laparoscopic right  colectomy Right 09/14/2015    Procedure: LAPAROSCOPIC RIGHT COLECTOMY  CONVERTED TO OPEN RIGHT COLECTOMY, LYSIS OF ADHESIONS;  Surgeon: Leonie Green, MD;  Location: [facility] ORS;  Service: General;  Laterality: Right;  . Cardiac catheterization N/A 09/17/2015    Procedure: Left Heart Cath and Coronary Angiography;  Surgeon: Wellington Hampshire, MD;  Location: [place] CV LAB;  Service: Cardiovascular;  Laterality: N/A;  . Cardiac catheterization N/A 09/17/2015    Procedure: Coronary Stent Intervention;  Surgeon: Wellington Hampshire, MD;  Location: [place] CV LAB;  Service: Cardiovascular;  Laterality: N/A;       History of present illness and  Hospital Course:     Kindly see H&P for history of present illness and admission details, please review complete Labs, Consult reports and Test reports for all details in brief  HPI  from the history and physical done on the day of admission 82 year old patient with history of recent colon resection, inferior ST elevation MI with drug-eluting stent to right coronary artery comes in because of abdominal pain. Patient had abdominal surgery done by Dr. Tamala Julian, postoperatively had an inferior ST elevation MI requiring drug-eluting stent to right coronary. The patient to the continues to have a abdominal pain and nausea since surgery. Patient abdominal CAT scan showed right lower quadrant abdominal abscess.   Hospital Course  #1 diffuse abdominal pains secondary to abdominal abscess. Patient had CT-guided drainage of the abscess by radiology, this was done on February 1. And  after the drainage patient felt very good. Abdominal pain improved. Her nausea improved. He started to tolerate the diet. The cultures from the abscess showed Enterococcus faecalis, Escherichia coli. Patient received IV meropenem IV every 8 hours for 6 days. And cultures are sensitive to ampicillin. Discharge home with ampicillin 500 mg 4 times daily for 7 days.had JP drain  placed and  today  It is  removed by surgery as his JP drain is minimal. #2 ventricular tachycardia: Patient developed V. tach during hospital stay required transfer to ICU, started on lidocaine drip, seen by cardiology,, transitioned to by mouth amiodarone. Seen by DR. Call wood , he recommended amiodarone loading, LifeVest for short-term period, patient will require AICD defibrillator. Patient echocardiogram showed 35-40% with akinesia of the inferolateral inferior and inferoseptal myocardium. Patient the use seen by LifeVest coordinator, he needs LifeVest before discharge. And he needs to take amiodarone 400 mg daily, follow up with  his primary cardiologist Dr. Nehemiah Massed in 2-3 days for evaluation of a defibrillator. I explained this to patient and patient's wife and the importance of  taking amiodarone and following up with cardiologist. #3 acute on chronic systolic heart failure: Patient received IV fluids, and developed shortness of breath, edema of the legs improved with IV Lasix, advised patient to take Lasix for 2 more days, he has no leg edema or shortness of breath at this time. #4 history of inferior wall  MI, follows up with Dr. Nehemiah Massed, patient is on aspirin, Plavix. #5 diabetes mellitus type 2:icontinue metformin,amaryl.    Discharge Condition: stable,   Follow UP  Follow-up Information    Follow up with BERT Briscoe Burns III, MD In 1 week.   Specialty:  Internal Medicine   Why:  Monday, February 13th at 145pm, ccs   Contact information:   [place] [address] [phone]       Follow up with Corey Skains, MD In 1 week.   Specialty:  Internal Medicine   Why:  Wednesday, February 22nd, 330pm, ccs   Contact information:   [address] [facility] [place] [address] [phone]       Follow up with Rochel Brome, MD In 1 week.   Specialty:  Surgery   Why:  Monday, February 20th at 1015am, ccs   Contact  information:   [place] [address] [phone]         Discharge Instructions  and  Discharge Medications       Medication List    STOP taking these medications        oxyCODONE-acetaminophen 5-325 MG tablet  Commonly known as:  PERCOCET/ROXICET      TAKE these medications        amiodarone 200 MG tablet  Commonly known as:  PACERONE  Take 1 tablet (200 mg total) by mouth 2 (two) times daily.     ampicillin 500 MG capsule  Commonly known as:  PRINCIPEN  Take 1 capsule (500 mg total) by mouth 4 (four) times daily.     aspirin EC 81 MG tablet  Take 81 mg by mouth daily.     clopidogrel 75 MG tablet  Commonly known as:  PLAVIX  Take 1 tablet (75 mg total) by mouth daily with breakfast.     furosemide 20 MG tablet  Commonly known as:  LASIX  Take 1 tablet (20 mg total) by mouth 2 (two) times daily.  glimepiride 4 MG tablet  Commonly known as:  AMARYL  Take 4 mg by mouth daily with breakfast.     loratadine 10 MG tablet  Commonly known as:  CLARITIN  Take 10 mg by mouth daily.     metFORMIN 500 MG tablet  Commonly known as:  GLUCOPHAGE  Take 500 mg by mouth 2 (two) times daily with a meal.     metoprolol succinate 25 MG 24 hr tablet  Commonly known as:  TOPROL-XL  Take 25 mg by mouth daily.     pantoprazole 40 MG tablet  Commonly known as:  PROTONIX  Take 40 mg by mouth daily.     simvastatin 40 MG tablet  Commonly known as:  ZOCOR  Take 40 mg by mouth daily at 6 PM.     vitamin B-12 250 MCG tablet  Commonly known as:  CYANOCOBALAMIN  Take 500 mcg by mouth daily.          Diet and Activity recommendation: See Discharge Instructions above   Consults obtained - cardiology, surgery   Major procedures and Radiology Reports - PLEASE review detailed and final reports for all details, in brief -      Ct Abdomen Pelvis Wo Contrast  09/29/2015  CLINICAL DATA:  Right lower quadrant pain following right  colectomy and lysis of adhesions EXAM: CT ABDOMEN AND PELVIS WITHOUT CONTRAST TECHNIQUE: Multidetector CT imaging of the abdomen and pelvis was performed following the standard protocol without IV contrast. COMPARISON:  09/24/2015 FINDINGS: Lung bases demonstrate bilateral pleural effusions. These are new from the prior exam. The liver, gallbladder, spleen, adrenal glands and pancreas are all stable from the prior exam. Multiple gallstones are again seen within the gallbladder. The kidneys demonstrate renal vascular calcifications although no calculi are identified. No obstructive changes are seen. Changes consistent with prior aortic stent graft repair are noted. No expansion of the aneurysm sac is seen. The bladder is well distended. There are again noted postsurgical changes consistent with prior distal colectomy as well as recent right colectomy. Contrast material is noted within the colon without significant small bowel dilatation indicating a patent anastomosis. In the right mid abdomen however extending from the tip of the liver there is a complex fluid collection identified which extends for approximately 18 cm along the right pericolic gutter. It measures approximately 5.6 cm in greatest AP dimension and approximately 7.7 cm in greatest transverse dimension. This has increased in the interval from the prior exam and is consistent with postoperative abscess. Within the fluid collection there is some mottled air suggesting fecal material although to this is uncertain on the basis of this exam. A few other areas of free fluid are noted particularly surrounding the spleen as well as a few pockets in the left pericolic gutter. These may be sterile and are not significantly increased when compared with the prior exam. The osseous structures are stable. IMPRESSION: New right lower quadrant abscess as described above. These findings were communicated to Dr. Tamala Julian at time of exam interpretation the patient is  planned for percutaneous drainage in the near future. Cholelithiasis without complicating factors. Postsurgical changes as described. Electronically Signed   By: Inez Catalina M.D.   On: 09/29/2015 13:36   Ct Abdomen Pelvis Wo Contrast  09/24/2015  ADDENDUM REPORT: 09/24/2015 13:15 ADDENDUM: It should be noted the kidneys are well visualized and demonstrate no definitive obstructive change. No renal calculi are seen although heavy renal vascular calcification is noted. Electronically Signed   By:  Inez Catalina M.D.   On: 09/24/2015 13:15  09/24/2015  CLINICAL DATA:  Postop abdominal pain, history of recent colonic surgery EXAM: CT ABDOMEN AND PELVIS WITHOUT CONTRAST TECHNIQUE: Multidetector CT imaging of the abdomen and pelvis was performed following the standard protocol without IV contrast. COMPARISON:  04/26/2015. FINDINGS: Lung bases are free of acute infiltrate or sizable effusion. The liver, spleen, adrenal glands and pancreas are all normal in their CT appearance. The gallbladder is well distended and demonstrates multiple dependent gallstones. No definitive pericholecystic fluid is seen. Small amounts of free fluid are noted surrounding the liver and spleen. These changes are likely related to the recent colonic resection. Postsurgical changes are noted in the right colon consistent with the patient's given clinical history. No obstructive changes are seen. A tiny focus of air is noted adjacent to the suture line again likely related to the recent surgery. The anastomosis appears widely patent. Changes of prior sigmoid colectomy are also noted. No significant obstructive changes are noted. Aortic stent graft is noted in satisfactory position. Comparison with prior exam shows a stable appearing aneurysm sac. Iliac calcifications are seen without aneurysmal dilatation. The bladder is well distended. Prostate calcifications are seen. No significant lymphadenopathy is noted. The osseous structures show  degenerative change of the lumbar spine. IMPRESSION: Small amounts of free fluid and free air likely related to the recent colonic surgery. No significant findings to suggest acute perforation are noted at this time. Multiple gallstones without definitive complicating factors. Electronically Signed: By: Inez Catalina M.D. On: 09/24/2015 12:29   Dg Abd 1 View  09/25/2015  CLINICAL DATA:  82 year old male with abdominal distension and pain. Recent colonic resection. EXAM: ABDOMEN - 1 VIEW COMPARISON:  09/24/2015 CT FINDINGS: Gaseous distension of the colon is noted. There is some oral contrast from recent CT present within the proximal colon. No dilated small bowel loops are noted. Surgical staples and clips are identified as well as an aortic stent graft. IMPRESSION: Gaseous colonic distention likely representing ileus. No dilated small bowel loops identified. Electronically Signed   By: Margarette Canada M.D.   On: 09/25/2015 10:53   US Renal  09/18/2015  CLINICAL DATA:  Acute renal failure. EXAM: RENAL / URINARY TRACT ULTRASOUND COMPLETE COMPARISON:  CT of the abdomen and pelvis 04/26/2015 FINDINGS: Right Kidney: Length: 11.1. Echogenicity within normal limits. No mass or hydronephrosis visualized. Left Kidney: Length: 11.7. Echogenicity within normal limits. No mass or hydronephrosis visualized. There is a questionable exophytic left lower pole renal mass measuring 1.9 x 1.4 x 1.4 cm. Bladder: Appears normal for degree of bladder distention. IMPRESSION: No evidence of hydronephrosis. Questionable exophytic left lower pole renal mass versus complicated cyst measuring 1.9 cm. Attention on follow-up is recommended. Electronically Signed   By: Fidela Salisbury M.D.   On: 09/18/2015 10:27   US Venous Img Upper Uni Left  10/01/2015  CLINICAL DATA:  Left upper extremity edema. EXAM: LEFT UPPER EXTREMITY VENOUS DOPPLER ULTRASOUND TECHNIQUE: Gray-scale sonography with graded compression, as well as color Doppler and  duplex ultrasound were performed to evaluate the upper extremity deep venous system from the level of the subclavian vein and including the jugular, axillary, basilic, radial, ulnar and upper cephalic vein. Spectral Doppler was utilized to evaluate flow at rest and with distal augmentation maneuvers. COMPARISON:  None. FINDINGS: Contralateral Subclavian Vein: Respiratory phasicity is normal and symmetric with the symptomatic side. No evidence of thrombus. Normal compressibility. Internal Jugular Vein: No evidence of thrombus. Normal compressibility, respiratory phasicity  and response to augmentation. Subclavian Vein: No evidence of thrombus. Normal compressibility, respiratory phasicity and response to augmentation. Axillary Vein: No evidence of thrombus. Normal compressibility, respiratory phasicity and response to augmentation. Cephalic Vein: No evidence of thrombus. Normal compressibility, respiratory phasicity and response to augmentation. Basilic Vein: No evidence of thrombus. Normal compressibility, respiratory phasicity and response to augmentation. Brachial Veins: No evidence of thrombus. Normal compressibility, respiratory phasicity and response to augmentation. Radial Veins: No evidence of thrombus. Normal compressibility, respiratory phasicity and response to augmentation. Ulnar Veins: No evidence of thrombus. Normal compressibility, respiratory phasicity and response to augmentation. Venous Reflux:  None visualized. Other Findings: No evidence of superficial thrombophlebitis or abnormal fluid collection. IMPRESSION: No evidence of left upper extremity deep venous thrombosis. Electronically Signed   By: Aletta Edouard M.D.   On: 10/01/2015 12:31   Dg Chest Portable 1 View  09/24/2015  CLINICAL DATA:  Central line placement.  NG tube placement EXAM: PORTABLE CHEST 1 VIEW COMPARISON:  09/24/2015 FINDINGS: Right central line tip is in the SVC. No pneumothorax. NG tube enters the stomach. Heart is upper  limits normal in size. Bibasilar opacities, likely atelectasis. Mild vascular congestion. No effusions or acute bony abnormality. IMPRESSION: Right central line tip in the SVC. No pneumothorax. NG tube enters the stomach. Mild vascular congestion and bibasilar atelectasis. Electronically Signed   By: Rolm Baptise M.D.   On: 09/24/2015 18:01   Dg Chest Port 1 View  09/24/2015  CLINICAL DATA:  Recent colon resection, epigastric pain, shortness of breath, last bowel movement yesterday, hypertension, diabetes mellitus, coronary artery disease post angioplasty, hyperlipidemia, GERD EXAM: PORTABLE CHEST 1 VIEW COMPARISON:  Portable exam 1000 hours compared to 04/14/2014 FINDINGS: Enlargement of cardiac silhouette. Atherosclerotic calcification aorta. Mediastinal contours and pulmonary vascularity normal for technique. Lungs clear. No pleural effusion or pneumothorax. Bones demineralized. IMPRESSION: Minimal enlargement of cardiac silhouette. No acute abnormalities. Electronically Signed   By: Lavonia Dana M.D.   On: 09/24/2015 10:25   Dg Abd Acute W/chest  09/27/2015  CLINICAL DATA:  Abdominal pain.  Recent partial colectomy EXAM: DG ABDOMEN ACUTE W/ 1V CHEST COMPARISON:  Two days ago FINDINGS: Normal heart size. Negative aortic and hilar contours. Right IJ central line, tip at the SVC level. COPD changes. There is no edema, consolidation, effusion, or pneumothorax. Unchanged gaseous distention of colon, greatest in the left abdomen. Gas and stool reaches the rectum. No pneumoperitoneum. No evidence of pneumatosis. Aortoiliac stenting. IMPRESSION: Unchanged gaseous distention of the postoperative colon, likely adynamic ileus. Electronically Signed   By: Monte Fantasia M.D.   On: 09/27/2015 05:56   Ct Image Guided Drainage By Percutaneous Catheter  09/29/2015  INDICATION: History of recent right colectomy with enlarging right pericolic fluid collection now with air within the suggestive of intra-abdominal abscess.  EXAM: CT GUIDED DRAINAGE OF RIGHT PERICOLIC ABSCESS MEDICATIONS: The patient is currently admitted to the hospital and receiving intravenous antibiotics. The antibiotics were administered within an appropriate time frame prior to the initiation of the procedure. ANESTHESIA/SEDATION: NONE COMPLICATIONS: None immediate. TECHNIQUE: Informed written consent was obtained from the patient after a thorough discussion of the procedural risks, benefits and alternatives. All questions were addressed. Maximal Sterile Barrier Technique was utilized including caps, mask, sterile gowns, sterile gloves, sterile drape, hand hygiene and skin antiseptic. A timeout was performed prior to the initiation of the procedure. PROCEDURE: The operative field was prepped with Chlorhexidine in a sterile fashion, and a sterile drape was applied covering the operative field. A  sterile gown and sterile gloves were used for the procedure. Local anesthesia was provided with 1% Lidocaine. Utilizing CT guidance and a quick stick technique a 10 French drainage catheter was placed into the right pericolic fluid collection without difficulty. Turbid serous fluid was withdrawn without difficulty and sent for laboratory evaluation. Catheter was then sutured into place utilizing 0 Prolene and placed to an external suction grenade. The catheter was then dressed in the standard manner. The patient tolerated the procedure well and was returned to his room in satisfactory condition. FINDINGS: Stable air-fluid collection in the right pericolic gutter similar to that seen on recent CT examination. IMPRESSION: Successful placement of a drainage catheter under CT guidance as described. Electronically Signed   By: Inez Catalina M.D.   On: 09/29/2015 16:51    Micro Results    Recent Results (from the past 240 hour(s))  CULTURE, BLOOD (ROUTINE X 2) w Reflex to PCR ID Panel     Status: None   Collection Time: 09/26/15 11:34 AM  Result Value Ref Range Status    Specimen Description BLOOD LEFT HAND  Final   Special Requests   Final    BOTTLES DRAWN AEROBIC AND ANAEROBIC  AER 1CC ANA 3CC   Culture NO GROWTH 5 DAYS  Final   Report Status 10/01/2015 FINAL  Final  CULTURE, BLOOD (ROUTINE X 2) w Reflex to PCR ID Panel     Status: None   Collection Time: 09/26/15 11:35 AM  Result Value Ref Range Status   Specimen Description BLOOD LEFT ARM  Final   Special Requests BOTTLES DRAWN AEROBIC AND ANAEROBIC  10CC  Final   Culture NO GROWTH 5 DAYS  Final   Report Status 10/01/2015 FINAL  Final  MRSA PCR Screening     Status: None   Collection Time: 09/27/15  9:15 AM  Result Value Ref Range Status   MRSA by PCR NEGATIVE NEGATIVE Final    Comment:        The GeneXpert MRSA Assay (FDA approved for NASAL specimens only), is one component of a comprehensive MRSA colonization surveillance program. It is not intended to diagnose MRSA infection nor to guide or monitor treatment for MRSA infections.   Culture, routine-abscess     Status: None (Preliminary result)   Collection Time: 09/29/15  5:11 PM  Result Value Ref Range Status   Specimen Description BIOPSY  Final   Special Requests Normal  Final   Gram Stain PENDING  Incomplete   Culture   Final    MODERATE GROWTH ESCHERICHIA COLI MODERATE GROWTH ENTEROCOCCUS FAECALIS    Report Status PENDING  Incomplete   Organism ID, Bacteria ESCHERICHIA COLI  Final   Organism ID, Bacteria ENTEROCOCCUS FAECALIS  Final      Susceptibility   Escherichia coli - MIC*    AMPICILLIN Value in next row Sensitive      SENSITIVE<=2    CEFAZOLIN Value in next row Sensitive      SENSITIVE<=4    CEFEPIME Value in next row Sensitive      SENSITIVE<=1    CEFTAZIDIME Value in next row Sensitive      SENSITIVE<=1    CEFTRIAXONE Value in next row Sensitive      SENSITIVE<=1    CIPROFLOXACIN Value in next row Sensitive      SENSITIVE<=0.25    GENTAMICIN Value in next row Sensitive      SENSITIVE<=1    IMIPENEM Value in  next row Sensitive  SENSITIVE<=0.25    TRIMETH/SULFA Value in next row Sensitive      SENSITIVE<=20    AMPICILLIN/SULBACTAM Value in next row Sensitive      SENSITIVE<=2    PIP/TAZO Value in next row Sensitive      SENSITIVE<=4    Extended ESBL Value in next row Sensitive      SENSITIVE<=4    * MODERATE GROWTH ESCHERICHIA COLI   Enterococcus faecalis - MIC*    AMPICILLIN Value in next row Sensitive      SENSITIVE<=2    VANCOMYCIN Value in next row Sensitive      SENSITIVE1    GENTAMICIN SYNERGY Value in next row Sensitive      SENSITIVE1    LINEZOLID Value in next row Sensitive      SENSITIVE2    * MODERATE GROWTH ENTEROCOCCUS FAECALIS       Today   Subjective:   Nicholas Parker today has no headache,no chest abdominal pain,no new weakness tingling or numbness, feels much better wants to go home today.   Objective:   Blood pressure 104/50, pulse 72, temperature 98.2 F (36.8 C), temperature source Oral, resp. rate 18, height '5\' 6"'$  (1.676 m), weight 81.2 kg (179 lb 0.2 oz), SpO2 97 %.   Intake/Output Summary (Last 24 hours) at 10/04/15 1258 Last data filed at 10/04/15 1141  Gross per 24 hour  Intake    750 ml  Output   1675 ml  Net   -925 ml    Exam Awake Alert, Oriented x 3, No new F.N deficits, Normal affect [place].AT,PERRAL Supple Neck,No JVD, No cervical lymphadenopathy appriciated.  Symmetrical Chest wall movement, Good air movement bilaterally, CTAB RRR,No Gallops,Rubs or new Murmurs, No Parasternal Heave +ve B.Sounds, Abd Soft, Non tender, No organomegaly appriciated, No rebound -guarding or rigidity. No Cyanosis, Clubbing or edema, No new Rash or bruise  Data Review   CBC w Diff: Lab Results  Component Value Date   WBC 7.8 10/03/2015   WBC 10.8* 04/23/2014   HGB 9.4* 10/03/2015   HGB 13.5 04/23/2014   HCT 29.2* 10/03/2015   HCT 40.8 04/23/2014   PLT 302 10/03/2015   PLT 184 04/23/2014   LYMPHOPCT 3 09/28/2015   LYMPHOPCT 7.4 04/23/2014   MONOPCT  5 09/28/2015   MONOPCT 7.6 04/23/2014   EOSPCT 1 09/28/2015   EOSPCT 0.0 04/23/2014   BASOPCT 0 09/28/2015   BASOPCT 0.3 04/23/2014    CMP: Lab Results  Component Value Date   NA 140 10/02/2015   NA 141 04/23/2014   K 3.9 10/02/2015   K 4.6 04/23/2014   CL 99* 10/02/2015   CL 108* 04/23/2014   CO2 34* 10/02/2015   CO2 21 04/23/2014   BUN 12 10/02/2015   BUN 14 04/23/2014   CREATININE 0.85 10/02/2015   CREATININE 1.09 04/23/2014   PROT 7.3 09/24/2015   PROT 6.7 04/23/2014   ALBUMIN 3.6 09/24/2015   ALBUMIN 3.5 04/23/2014   BILITOT 1.3* 09/24/2015   BILITOT 0.4 04/23/2014   ALKPHOS 55 09/24/2015   ALKPHOS 51 04/23/2014   AST 36 09/24/2015   AST 18 04/23/2014   ALT 20 09/24/2015   ALT 15 04/23/2014  .   Total Time in preparing paper work, data evaluation and todays exam - 9 minutes  Nicholas Parker M.D on 10/04/2015 at 12:58 PM    Note: This dictation was prepared with Dragon dictation along with smaller phrase technology. Any transcriptional errors that result from this process are unintentional.

## 2015-10-04 NOTE — Care Management (Signed)
Spoke with [employer] regarding authorization for the [employer].  Informed that this company does not perform authorizations for this particular [employer] (HMO Gold [facility])  Spoke with [facility] with [employer] and obtained fax number for [employer] as this is the agency that gives authorizations for patient's [employer].  FAxed the referral then called the agency and was informed that this authorization would be denied because it was not sent by the DME company.  Spoke with Santa Genera with [employer] and informed of above.  Received call from [facility] with [employer] and she says she is looking into this matter.  Meanwhile patient wishes to discharge and asks: what happens if i leave without the vest.  CM informed him he could die.  He and his wife say- he is not wearing it here.  Discussed that he is being monitored on telemetry.  CM contacted Dr Clayborn Bigness and request that he come to patient's room and speak with patient and his wife.  Patient understandably tired of being in the hospital. he and his wife deny need for home healtn nurse.

## 2015-10-04 NOTE — Care Management Important Message (Signed)
Important Message  Patient Details  Name: Nicholas Parker MRN: [id number] Date of Birth: 1934/06/03   Medicare Important Message Given:  Yes    Juliann Pulse A Kenadee Gates 10/04/2015, 1:37 PM

## 2015-10-04 NOTE — Progress Notes (Signed)
Patient received discharge instructions, pt verbalized understanding. IJ was removed with no signs of infection. Dressing clean, dry intact. No skin tears or wounds present. Prescription was sent to pharmacy of choice. Patient was escorted out with staff member via wheelchair via private auto. Per Dr. Clayborn Bigness patient okay to discharge home today without [employer] defibrillator. Patient stated he does not want to wait. Patient spoke with Nann about setting up [employer] at home after discharge. Nann presented patient with name and contact number to follow up with set up once the patient is home.  No further needs from care management team.

## 2015-10-04 NOTE — Progress Notes (Signed)
He reports good progress.  He has been eating satisfactorily and moving his bowels.  He has been walking in the hallway.  He reports no abdominal pain.  There was no drainage recorded from the abdominal drain over 24 hours and on examination of fine approximately 5 cc of serous fluid in the collection container.  On examination his temperature is normal.  Abdomen is soft and nontender.  IMPRESSION: Resolving intra-abdominal abscess  His drain was removed.  Recommend discharged today on oral ampicillin for 1 week.  Remove central line today.  He was advised he can remove the abdominal dressing and the central line dressing tomorrow take a shower and apply Band-Aids and change daily until dry.  Follow-up with me next week in the surgery department

## 2015-10-04 NOTE — Care Management CHF Note (Signed)
Patient spoke with cardiologist and wishes to discharge home without the Life Vest.  He is agreeable to  being fitted in the home.  He again declined  to have any home health services.  Within 30 minutes of patient leaving- received authorization for the life Vest- Auth Number- [id number].  Left voicemail message for Santa Genera.  Patient and wife agreeable for [employer] staff to visit him in the home for fitting.

## 2015-10-04 NOTE — Care Management (Signed)
Patient spoke with cardiology and adamant about discharging home before receive authorization from insurance company.   Received auth for the vest approximately 15-20 mins after patient discharged.  Notified [employer].  spoke with Mrs Almanza and informed her.  She and patient agreeable for [employer] to make home visit to fit.

## 2015-10-04 NOTE — Progress Notes (Signed)
Patient ID: Nicholas Parker, male   DOB: 1934-04-03, 82 y.o.   MRN: [id number] Patient has had an episode of sustained ventricular tachycardia as an inpatient requiring shock. He was placed on amiodarone and lidocaine for several days in the unit and switched to by mouth amiodarone and stent reasonably well. No further episodes of ventricular tachycardia were noted because of his ischemic cardiomyopathy it was recommended he have a LifeVest. We've had difficulty obtaining the breasts because of insurance issues and authorizations the patient is now frustrated and says he is willing to assume the risk but he wants to be discharged today.  Assessment discussed with the patient and his wife including death he is willing to assume those risk and will follow-up with his cardiologist within a week or so for permanent AICD placement. He is to maintain amiodarone 400 mg a day. Case has been discussed with the patient his wife and with the hospitalist. The patient understands these risks and is ready to be discharged   [facility] Card[facility] 1300 10/05/15

## 2015-10-05 DIAGNOSIS — I469 Cardiac arrest, cause unspecified: Secondary | ICD-10-CM | POA: Diagnosis not present

## 2015-10-06 ENCOUNTER — Inpatient Hospital Stay
Admission: EM | Admit: 2015-10-06 | Discharge: 2015-10-08 | DRG: 862 | Disposition: A | Discharge status: Home / Self Care | Payer: Commercial Managed Care - HMO | Attending: Internal Medicine | Admitting: Internal Medicine

## 2015-10-06 ENCOUNTER — Emergency Department: Payer: Commercial Managed Care - HMO

## 2015-10-06 ENCOUNTER — Encounter: Payer: Self-pay | Admitting: Emergency Medicine

## 2015-10-06 DIAGNOSIS — B952 Enterococcus as the cause of diseases classified elsewhere: Secondary | ICD-10-CM | POA: Diagnosis present

## 2015-10-06 DIAGNOSIS — R569 Unspecified convulsions: Secondary | ICD-10-CM | POA: Diagnosis not present

## 2015-10-06 DIAGNOSIS — I714 Abdominal aortic aneurysm, without rupture: Secondary | ICD-10-CM | POA: Diagnosis present

## 2015-10-06 DIAGNOSIS — Z85828 Personal history of other malignant neoplasm of skin: Secondary | ICD-10-CM

## 2015-10-06 DIAGNOSIS — K219 Gastro-esophageal reflux disease without esophagitis: Secondary | ICD-10-CM | POA: Diagnosis present

## 2015-10-06 DIAGNOSIS — T814XXA Infection following a procedure, initial encounter: Principal | ICD-10-CM | POA: Diagnosis present

## 2015-10-06 DIAGNOSIS — E162 Hypoglycemia, unspecified: Secondary | ICD-10-CM | POA: Diagnosis present

## 2015-10-06 DIAGNOSIS — Z79899 Other long term (current) drug therapy: Secondary | ICD-10-CM

## 2015-10-06 DIAGNOSIS — Z7982 Long term (current) use of aspirin: Secondary | ICD-10-CM

## 2015-10-06 DIAGNOSIS — E785 Hyperlipidemia, unspecified: Secondary | ICD-10-CM | POA: Diagnosis present

## 2015-10-06 DIAGNOSIS — R630 Anorexia: Secondary | ICD-10-CM | POA: Diagnosis present

## 2015-10-06 DIAGNOSIS — E11649 Type 2 diabetes mellitus with hypoglycemia without coma: Secondary | ICD-10-CM | POA: Diagnosis not present

## 2015-10-06 DIAGNOSIS — Z7984 Long term (current) use of oral hypoglycemic drugs: Secondary | ICD-10-CM

## 2015-10-06 DIAGNOSIS — I5022 Chronic systolic (congestive) heart failure: Secondary | ICD-10-CM | POA: Diagnosis present

## 2015-10-06 DIAGNOSIS — I2510 Atherosclerotic heart disease of native coronary artery without angina pectoris: Secondary | ICD-10-CM | POA: Diagnosis present

## 2015-10-06 DIAGNOSIS — L0291 Cutaneous abscess, unspecified: Secondary | ICD-10-CM

## 2015-10-06 DIAGNOSIS — I110 Hypertensive heart disease with heart failure: Secondary | ICD-10-CM | POA: Diagnosis present

## 2015-10-06 DIAGNOSIS — Z888 Allergy status to other drugs, medicaments and biological substances status: Secondary | ICD-10-CM

## 2015-10-06 DIAGNOSIS — Z79891 Long term (current) use of opiate analgesic: Secondary | ICD-10-CM

## 2015-10-06 DIAGNOSIS — K651 Peritoneal abscess: Secondary | ICD-10-CM | POA: Diagnosis not present

## 2015-10-06 DIAGNOSIS — R1030 Lower abdominal pain, unspecified: Secondary | ICD-10-CM | POA: Diagnosis not present

## 2015-10-06 DIAGNOSIS — G40909 Epilepsy, unspecified, not intractable, without status epilepticus: Secondary | ICD-10-CM | POA: Diagnosis not present

## 2015-10-06 DIAGNOSIS — IMO0002 Reserved for concepts with insufficient information to code with codable children: Secondary | ICD-10-CM

## 2015-10-06 DIAGNOSIS — Z955 Presence of coronary angioplasty implant and graft: Secondary | ICD-10-CM

## 2015-10-06 DIAGNOSIS — Y838 Other surgical procedures as the cause of abnormal reaction of the patient, or of later complication, without mention of misadventure at the time of the procedure: Secondary | ICD-10-CM | POA: Diagnosis present

## 2015-10-06 DIAGNOSIS — Z7902 Long term (current) use of antithrombotics/antiplatelets: Secondary | ICD-10-CM

## 2015-10-06 DIAGNOSIS — Z87891 Personal history of nicotine dependence: Secondary | ICD-10-CM

## 2015-10-06 DIAGNOSIS — B9620 Unspecified Escherichia coli [E. coli] as the cause of diseases classified elsewhere: Secondary | ICD-10-CM | POA: Diagnosis present

## 2015-10-06 LAB — COMPREHENSIVE METABOLIC PANEL
ALBUMIN: 2.5 g/dL — AB (ref 3.5–5.0)
ALT: 12 U/L — ABNORMAL LOW (ref 17–63)
ANION GAP: 10 (ref 5–15)
AST: 23 U/L (ref 15–41)
Alkaline Phosphatase: 57 U/L (ref 38–126)
BUN: 14 mg/dL (ref 6–20)
CHLORIDE: 101 mmol/L (ref 101–111)
CO2: 28 mmol/L (ref 22–32)
Calcium: 8.3 mg/dL — ABNORMAL LOW (ref 8.9–10.3)
Creatinine, Ser: 1.12 mg/dL (ref 0.61–1.24)
GFR calc Af Amer: >60 mL/min (ref >60)
GFR calc non Af Amer: >60 mL/min (ref >60)
GLUCOSE: 89 mg/dL (ref 65–99)
POTASSIUM: 3.2 mmol/L — AB (ref 3.5–5.1)
SODIUM: 139 mmol/L (ref 135–145)
Total Bilirubin: 0.5 mg/dL (ref 0.3–1.2)
Total Protein: 5.5 g/dL — ABNORMAL LOW (ref 6.5–8.1)

## 2015-10-06 LAB — GLUCOSE, CAPILLARY
GLUCOSE-CAPILLARY: 144 mg/dL — AB (ref 65–99)
GLUCOSE-CAPILLARY: 80 mg/dL (ref 65–99)
GLUCOSE-CAPILLARY: 76 mg/dL (ref 65–99)
GLUCOSE-CAPILLARY: 83 mg/dL (ref 65–99)
Glucose-Capillary: 12 mg/dL — CL (ref 65–99)
Glucose-Capillary: 123 mg/dL — ABNORMAL HIGH (ref 65–99)
Glucose-Capillary: 50 mg/dL — ABNORMAL LOW (ref 65–99)
Glucose-Capillary: 60 mg/dL — ABNORMAL LOW (ref 65–99)

## 2015-10-06 LAB — CBC
HEMATOCRIT: 31.4 % — AB (ref 40.0–52.0)
HEMOGLOBIN: 9.2 g/dL — AB (ref 13.0–18.0)
MCH: 27.3 pg (ref 26.0–34.0)
MCHC: 29.3 g/dL — ABNORMAL LOW (ref 32.0–36.0)
MCV: 93.2 fL (ref 80.0–100.0)
Platelets: 273 10*3/uL (ref 150–440)
RBC: 3.37 MIL/uL — ABNORMAL LOW (ref 4.40–5.90)
RDW: 16.9 % — ABNORMAL HIGH (ref 11.5–14.5)
WBC: 12.6 10*3/uL — AB (ref 3.8–10.6)

## 2015-10-06 LAB — TROPONIN I: Troponin I: 0.33 ng/mL — ABNORMAL HIGH (ref <0.031)

## 2015-10-06 LAB — LACTIC ACID, PLASMA: Lactic Acid, Venous: 2.3 mmol/L (ref 0.5–2.0)

## 2015-10-06 MED ORDER — VANCOMYCIN HCL IN DEXTROSE 1-5 GM/200ML-% IV SOLN
1000.0000 mg | Freq: Once | INTRAVENOUS | Status: AC
  Administered 2015-10-06: 1000 mg via INTRAVENOUS
  Filled 2015-10-06: qty 200

## 2015-10-06 MED ORDER — DEXTROSE 50 % IV SOLN
25.0000 g | Freq: Once | INTRAVENOUS | Status: AC
  Administered 2015-10-06: 25 g via INTRAVENOUS

## 2015-10-06 MED ORDER — DEXTROSE 50 % IV SOLN
1.0000 | Freq: Once | INTRAVENOUS | Status: AC
  Administered 2015-10-06: 50 mL via INTRAVENOUS
  Filled 2015-10-06: qty 50

## 2015-10-06 MED ORDER — DEXTROSE 50 % IV SOLN
25.0000 mL | Freq: Once | INTRAVENOUS | Status: AC
  Administered 2015-10-06: 25 mL via INTRAVENOUS

## 2015-10-06 MED ORDER — IOHEXOL 300 MG/ML  SOLN
100.0000 mL | Freq: Once | INTRAMUSCULAR | Status: AC | PRN
  Administered 2015-10-06: 100 mL via INTRAVENOUS

## 2015-10-06 MED ORDER — IOHEXOL 240 MG/ML SOLN
25.0000 mL | Freq: Once | INTRAMUSCULAR | Status: AC | PRN
  Administered 2015-10-06: 25 mL via ORAL

## 2015-10-06 MED ORDER — DEXTROSE 50 % IV SOLN
INTRAVENOUS | Status: AC
  Filled 2015-10-06: qty 50

## 2015-10-06 MED ORDER — PIPERACILLIN-TAZOBACTAM 3.375 G IVPB
3.3750 g | Freq: Once | INTRAVENOUS | Status: AC
  Administered 2015-10-06: 3.375 g via INTRAVENOUS
  Filled 2015-10-06: qty 50

## 2015-10-06 MED ORDER — DEXTROSE 10 % IV SOLN
INTRAVENOUS | Status: DC
  Administered 2015-10-07: 05:00:00 via INTRAVENOUS

## 2015-10-06 MED ORDER — DEXTROSE 50 % IV SOLN
INTRAVENOUS | Status: AC
  Administered 2015-10-06: 25 g via INTRAVENOUS
  Filled 2015-10-06: qty 50

## 2015-10-06 NOTE — ED Notes (Signed)
Per ems: seizure, 10-15 minutes, given 5 mg versed IM by ems.  Hypoglycemic, once low, once 55, given d50 twice.  Currently 140,  Now a&o x 4, was hypotensive, currently map of 65.  Hx of recent colon surgery, has external defibrillator.

## 2015-10-06 NOTE — ED Provider Notes (Signed)
[facility] Emergency Department Provider Note  ____________________________________________  Time seen: On EMS arrival  I have reviewed the triage vital signs and the nursing notes.   HISTORY  Chief Complaint Seizures   History limited by: Not Limited   HPI Nicholas Parker is a 82 y.o. male who presents to the emergency department today because of concerns for seizure. The patient was recently discharged from the hospital after an admission for intra-abdominal abscess. The patient states that since his discharge 2 days ago he has been feeling well. He states the abdominal pain has improved. He has been continuing his antibiotics. Today the patient had a seizure. He did not have any chest pain or shortness of breath prior to the seizure. Upon EMSs arrival the patient's blood sugar registered low. He was given 2 amps of D50 with good response per EMS.The patient denies ever having seizures in the past. Denies having seizures secondary to low blood sugar in the past. The patient denies any fevers since discharge.     Past Medical History  Diagnosis Date  . Hypertension   . Diabetes mellitus without complication ([place])   . Hyperlipidemia   . Coronary artery disease   . neuropathy   . Low testosterone   . Neuropathy ([place])   . Anemia   . B12 deficiency   . GERD (gastroesophageal reflux disease)   . Skin cancer     Basal Cell  . Adenocarcinoma ([place])   . AAA (abdominal aortic aneurysm) ([place])   . Diverticulosis     Patient Active Problem List   Diagnosis Date Noted  . Renal failure 09/24/2015  . Acute ST elevation myocardial infarction (STEMI) involving right coronary artery ([place])   . Colon polyp 09/14/2015    Past Surgical History  Procedure Laterality Date  . Arthrosopic rotator cuff repair Left   . Robotic colectomy    . Coronary angioplasty    . Vascular surgery    . Colonoscopy with propofol N/A 04/13/2015    Procedure: COLONOSCOPY WITH  PROPOFOL;  Surgeon: Lollie Sails, MD;  Location: [facility] ENDOSCOPY;  Service: Endoscopy;  Laterality: N/A;  . Colonoscopy with propofol N/A 04/14/2015    Procedure: COLONOSCOPY WITH PROPOFOL;  Surgeon: Lollie Sails, MD;  Location: [facility] ENDOSCOPY;  Service: Endoscopy;  Laterality: N/A;  . Colonoscopy with propofol N/A 07/20/2015    Procedure: COLONOSCOPY WITH PROPOFOL;  Surgeon: Lollie Sails, MD;  Location: [facility] ENDOSCOPY;  Service: Endoscopy;  Laterality: N/A;  . Colon surgery    . Eye surgery Bilateral     Cataract Extraction with IOL  . Abdominal aortic aneurysm repair  2014    Dr. Delana Meyer, [facility]  . Laparoscopic right colectomy Right 09/14/2015    Procedure: LAPAROSCOPIC RIGHT COLECTOMY  CONVERTED TO OPEN RIGHT COLECTOMY, LYSIS OF ADHESIONS;  Surgeon: Leonie Green, MD;  Location: [facility] ORS;  Service: General;  Laterality: Right;  . Cardiac catheterization N/A 09/17/2015    Procedure: Left Heart Cath and Coronary Angiography;  Surgeon: Wellington Hampshire, MD;  Location: [facility] CV LAB;  Service: Cardiovascular;  Laterality: N/A;  . Cardiac catheterization N/A 09/17/2015    Procedure: Coronary Stent Intervention;  Surgeon: Wellington Hampshire, MD;  Location: [facility] CV LAB;  Service: Cardiovascular;  Laterality: N/A;    Current Outpatient Rx  Name  Route  Sig  Dispense  Refill  . amiodarone (PACERONE) 400 MG tablet   Oral   Take 1 tablet (400 mg total) by mouth  daily.   30 tablet   0   . ampicillin (PRINCIPEN) 500 MG capsule   Oral   Take 1 capsule (500 mg total) by mouth 4 (four) times daily.   28 capsule   0   . aspirin EC 81 MG tablet   Oral   Take 81 mg by mouth daily.         . clopidogrel (PLAVIX) 75 MG tablet   Oral   Take 1 tablet (75 mg total) by mouth daily with breakfast.   30 tablet   11   . furosemide (LASIX) 20 MG tablet   Oral   Take 1 tablet (20 mg total) by mouth 2 (two) times daily.   4 tablet   0   . glimepiride (AMARYL) 4 MG  tablet   Oral   Take 4 mg by mouth daily with breakfast.         . loratadine (CLARITIN) 10 MG tablet   Oral   Take 10 mg by mouth daily.         . metFORMIN (GLUCOPHAGE) 500 MG tablet   Oral   Take 500 mg by mouth 2 (two) times daily with a meal.          . metoprolol succinate (TOPROL-XL) 25 MG 24 hr tablet   Oral   Take 25 mg by mouth daily.         . pantoprazole (PROTONIX) 40 MG tablet   Oral   Take 40 mg by mouth daily.          . simvastatin (ZOCOR) 40 MG tablet   Oral   Take 40 mg by mouth daily at 6 PM.          . vitamin B-12 (CYANOCOBALAMIN) 250 MCG tablet   Oral   Take 500 mcg by mouth daily.           Allergies Lipitor; Pravachol; Riomet; and Vytorin  No family history on file.  Social History Social History  Substance Use Topics  . Smoking status: Former Smoker -- 1.50 packs/day    Types: Cigarettes    Quit date: 10/03/2003  . Smokeless tobacco: Never Used  . Alcohol Use: 0.6 oz/week    1 Cans of beer per week    Review of Systems  Constitutional: Negative for fever. Cardiovascular: Negative for chest pain. Respiratory: Negative for shortness of breath. Gastrointestinal: Negative for abdominal pain, vomiting and diarrhea. Neurological: Negative for headaches, focal weakness or numbness. Positive for seizure 10-point ROS otherwise negative.  ____________________________________________   PHYSICAL EXAM:  VITAL SIGNS:   97.7 F (36.5 C)  78   27   103/49 mmHg  95 %    Constitutional: Alert and oriented. Well appearing and in no distress. Eyes: Conjunctivae are normal. PERRL. Normal extraocular movements. ENT   Head: Normocephalic and atraumatic.   Nose: No congestion/rhinnorhea.   Mouth/Throat: Mucous membranes are moist.   Neck: No stridor. Hematological/Lymphatic/Immunilogical: No cervical lymphadenopathy. Cardiovascular: Normal rate, regular rhythm.  No murmurs, rubs, or gallops. Respiratory: Normal  respiratory effort without tachypnea nor retractions. Breath sounds are clear and equal bilaterally. No wheezes/rales/rhonchi. Gastrointestinal: Soft and nontender. Well-healing surgical scar present. No distention.  Genitourinary: Deferred Musculoskeletal: Normal range of motion in all extremities. No joint effusions.  No lower extremity tenderness nor edema. Neurologic:  Normal speech and language. No gross focal neurologic deficits are appreciated.  Skin:  Skin is warm, dry and intact. No rash noted. Psychiatric: Mood and affect are normal.  Speech and behavior are normal. Patient exhibits appropriate insight and judgment.  ____________________________________________    LABS (pertinent positives/negatives)  Lactic acid 2.3 Trop 0.33 Na 139 K 3.2 Cr 1.12 WBC 12.6 Hgb 9.2   ____________________________________________   EKG  I, Nance Pear, attending physician, personally viewed and interpreted this EKG  EKG Time: 1811 Rate: 77 Rhythm: normal sinus rhythm Axis: normal Intervals: qtc 515 QRS: RBBB ST changes: no st elevation Impression: abnormal ekg ____________________________________________    RADIOLOGY  CT abd/pel IMPRESSION: Cholelithiasis.  Status post endovascular repair of infrarenal abdominal aortic aneurysm. No endoleak is noted.  Mild right pleural effusion with adjacent subsegmental atelectasis.  Status post right colectomy. 14.6 x 5.2 x 3.1 cm abscess remains along the right lateral chest wall and extending into right pericolic gutter.  ____________________________________________   PROCEDURES  Procedure(s) performed: None  Critical Care performed: Yes, see critical care note(s)  CRITICAL CARE Performed by: Nance Pear   Total critical care time: 45 minutes  Critical care time was exclusive of separately billable procedures and treating other patients.  Critical care was necessary to treat or prevent imminent or  life-threatening deterioration.  Critical care was time spent personally by me on the following activities: development of treatment plan with patient and/or surrogate as well as nursing, discussions with consultants, evaluation of patient's response to treatment, examination of patient, obtaining history from patient or surrogate, ordering and performing treatments and interventions, ordering and review of laboratory studies, ordering and review of radiographic studies, pulse oximetry and re-evaluation of patient's condition.  ____________________________________________   INITIAL IMPRESSION / ASSESSMENT AND PLAN / ED COURSE  Pertinent labs & imaging results that were available during my care of the patient were reviewed by me and considered in my medical decision making (see chart for details).  Patient presented to the emergency department today via EMS because of seizure. When EMS arrived his glucose was registering as low. They did give him 2 A of D50. Upon initial arrival here patient's blood sugar was within normal limits. It however quickly dropped. Patient did not have any further seizure activities however we did give additional boluses of D50 as well as starting him on a D10 drip. I think likely the seizure activity was due to the hypoglycemia. The source of the hypoglycemia is likely sepsis secondary to intra-abdominal abscess. CT scan confirmed presence of the intra-abdominal abscess for which she is been treated in the hospital last week. Will plan on admission to the hospital service.  ____________________________________________   FINAL CLINICAL IMPRESSION(S) / ED DIAGNOSES  Final diagnoses:  Seizure ([place])  Hypoglycemia  Abscess     Nance Pear, MD 10/07/15 1534

## 2015-10-06 NOTE — ED Notes (Signed)
Patient became spaced out and glazed over, had some tremors.  MD notified. Glucose was checked and found to be 12, immediately patient was given 1 amp of d50 and started on 200 cc/hr of d10 per Dr. Gennette Pac orders.  Rechecked glucose a few minutes after and it was 143.  Patient began to become oriented and alert again and stated that he felt better.  Will recheck again soon.

## 2015-10-07 ENCOUNTER — Inpatient Hospital Stay: Payer: Commercial Managed Care - HMO

## 2015-10-07 ENCOUNTER — Emergency Department: Payer: Commercial Managed Care - HMO

## 2015-10-07 DIAGNOSIS — I2510 Atherosclerotic heart disease of native coronary artery without angina pectoris: Secondary | ICD-10-CM | POA: Diagnosis not present

## 2015-10-07 DIAGNOSIS — B952 Enterococcus as the cause of diseases classified elsewhere: Secondary | ICD-10-CM | POA: Diagnosis present

## 2015-10-07 DIAGNOSIS — Z87891 Personal history of nicotine dependence: Secondary | ICD-10-CM | POA: Diagnosis not present

## 2015-10-07 DIAGNOSIS — Z955 Presence of coronary angioplasty implant and graft: Secondary | ICD-10-CM | POA: Diagnosis not present

## 2015-10-07 DIAGNOSIS — E162 Hypoglycemia, unspecified: Secondary | ICD-10-CM | POA: Diagnosis not present

## 2015-10-07 DIAGNOSIS — Z7984 Long term (current) use of oral hypoglycemic drugs: Secondary | ICD-10-CM | POA: Diagnosis not present

## 2015-10-07 DIAGNOSIS — E785 Hyperlipidemia, unspecified: Secondary | ICD-10-CM | POA: Diagnosis present

## 2015-10-07 DIAGNOSIS — I10 Essential (primary) hypertension: Secondary | ICD-10-CM | POA: Diagnosis not present

## 2015-10-07 DIAGNOSIS — R569 Unspecified convulsions: Secondary | ICD-10-CM | POA: Diagnosis not present

## 2015-10-07 DIAGNOSIS — R1030 Lower abdominal pain, unspecified: Secondary | ICD-10-CM | POA: Diagnosis not present

## 2015-10-07 DIAGNOSIS — I5022 Chronic systolic (congestive) heart failure: Secondary | ICD-10-CM | POA: Diagnosis not present

## 2015-10-07 DIAGNOSIS — B9620 Unspecified Escherichia coli [E. coli] as the cause of diseases classified elsewhere: Secondary | ICD-10-CM | POA: Diagnosis present

## 2015-10-07 DIAGNOSIS — Z85828 Personal history of other malignant neoplasm of skin: Secondary | ICD-10-CM | POA: Diagnosis not present

## 2015-10-07 DIAGNOSIS — E119 Type 2 diabetes mellitus without complications: Secondary | ICD-10-CM | POA: Diagnosis not present

## 2015-10-07 DIAGNOSIS — G40909 Epilepsy, unspecified, not intractable, without status epilepticus: Secondary | ICD-10-CM | POA: Diagnosis not present

## 2015-10-07 DIAGNOSIS — T814XXA Infection following a procedure, initial encounter: Secondary | ICD-10-CM | POA: Diagnosis not present

## 2015-10-07 DIAGNOSIS — K651 Peritoneal abscess: Secondary | ICD-10-CM | POA: Diagnosis not present

## 2015-10-07 DIAGNOSIS — Z7902 Long term (current) use of antithrombotics/antiplatelets: Secondary | ICD-10-CM | POA: Diagnosis not present

## 2015-10-07 DIAGNOSIS — Z79899 Other long term (current) drug therapy: Secondary | ICD-10-CM | POA: Diagnosis not present

## 2015-10-07 DIAGNOSIS — Z79891 Long term (current) use of opiate analgesic: Secondary | ICD-10-CM | POA: Diagnosis not present

## 2015-10-07 DIAGNOSIS — K219 Gastro-esophageal reflux disease without esophagitis: Secondary | ICD-10-CM | POA: Diagnosis present

## 2015-10-07 DIAGNOSIS — R630 Anorexia: Secondary | ICD-10-CM | POA: Diagnosis not present

## 2015-10-07 DIAGNOSIS — I110 Hypertensive heart disease with heart failure: Secondary | ICD-10-CM | POA: Diagnosis not present

## 2015-10-07 DIAGNOSIS — I714 Abdominal aortic aneurysm, without rupture: Secondary | ICD-10-CM | POA: Diagnosis present

## 2015-10-07 DIAGNOSIS — E11649 Type 2 diabetes mellitus with hypoglycemia without coma: Secondary | ICD-10-CM | POA: Diagnosis not present

## 2015-10-07 DIAGNOSIS — Z888 Allergy status to other drugs, medicaments and biological substances status: Secondary | ICD-10-CM | POA: Diagnosis not present

## 2015-10-07 DIAGNOSIS — Z7982 Long term (current) use of aspirin: Secondary | ICD-10-CM | POA: Diagnosis not present

## 2015-10-07 DIAGNOSIS — Y838 Other surgical procedures as the cause of abnormal reaction of the patient, or of later complication, without mention of misadventure at the time of the procedure: Secondary | ICD-10-CM | POA: Diagnosis present

## 2015-10-07 LAB — GLUCOSE, CAPILLARY
GLUCOSE-CAPILLARY: 107 mg/dL — AB (ref 65–99)
GLUCOSE-CAPILLARY: 120 mg/dL — AB (ref 65–99)
GLUCOSE-CAPILLARY: 131 mg/dL — AB (ref 65–99)
GLUCOSE-CAPILLARY: 140 mg/dL — AB (ref 65–99)
GLUCOSE-CAPILLARY: 30 mg/dL — AB (ref 65–99)
GLUCOSE-CAPILLARY: 34 mg/dL — AB (ref 65–99)
GLUCOSE-CAPILLARY: 36 mg/dL — AB (ref 65–99)
GLUCOSE-CAPILLARY: 39 mg/dL — AB (ref 65–99)
GLUCOSE-CAPILLARY: 61 mg/dL — AB (ref 65–99)
GLUCOSE-CAPILLARY: 62 mg/dL — AB (ref 65–99)
GLUCOSE-CAPILLARY: 70 mg/dL (ref 65–99)
GLUCOSE-CAPILLARY: 71 mg/dL (ref 65–99)
GLUCOSE-CAPILLARY: 72 mg/dL (ref 65–99)
GLUCOSE-CAPILLARY: 89 mg/dL (ref 65–99)
Glucose-Capillary: 81 mg/dL (ref 65–99)
Glucose-Capillary: 117 mg/dL — ABNORMAL HIGH (ref 65–99)
Glucose-Capillary: 87 mg/dL (ref 65–99)
Glucose-Capillary: 36 mg/dL — CL (ref 65–99)
Glucose-Capillary: 46 mg/dL — ABNORMAL LOW (ref 65–99)
Glucose-Capillary: 60 mg/dL — ABNORMAL LOW (ref 65–99)
Glucose-Capillary: 86 mg/dL (ref 65–99)
Glucose-Capillary: 356 mg/dL — ABNORMAL HIGH (ref 65–99)
Glucose-Capillary: 60 mg/dL — ABNORMAL LOW (ref 65–99)
Glucose-Capillary: 45 mg/dL — ABNORMAL LOW (ref 65–99)
Glucose-Capillary: 68 mg/dL (ref 65–99)
Glucose-Capillary: 80 mg/dL (ref 65–99)
Glucose-Capillary: 89 mg/dL (ref 65–99)
Glucose-Capillary: 113 mg/dL — ABNORMAL HIGH (ref 65–99)

## 2015-10-07 LAB — BASIC METABOLIC PANEL
Anion gap: 5 (ref 5–15)
BUN: 10 mg/dL (ref 6–20)
CHLORIDE: 100 mmol/L — AB (ref 101–111)
CO2: 33 mmol/L — ABNORMAL HIGH (ref 22–32)
CREATININE: 1.1 mg/dL (ref 0.61–1.24)
Calcium: 8.2 mg/dL — ABNORMAL LOW (ref 8.9–10.3)
GFR calc Af Amer: >60 mL/min (ref >60)
GFR calc non Af Amer: >60 mL/min (ref >60)
GLUCOSE: 73 mg/dL (ref 65–99)
POTASSIUM: 3.8 mmol/L (ref 3.5–5.1)
SODIUM: 138 mmol/L (ref 135–145)

## 2015-10-07 LAB — MRSA PCR SCREENING: MRSA BY PCR: NEGATIVE

## 2015-10-07 LAB — CBC
HEMATOCRIT: 30 % — AB (ref 40.0–52.0)
Hemoglobin: 9.8 g/dL — ABNORMAL LOW (ref 13.0–18.0)
MCH: 27.4 pg (ref 26.0–34.0)
MCHC: 32.9 g/dL (ref 32.0–36.0)
MCV: 83.3 fL (ref 80.0–100.0)
PLATELETS: 286 10*3/uL (ref 150–440)
RBC: 3.6 MIL/uL — ABNORMAL LOW (ref 4.40–5.90)
RDW: 15.5 % — AB (ref 11.5–14.5)
WBC: 10.8 10*3/uL — AB (ref 3.8–10.6)

## 2015-10-07 LAB — GLUCOSE, RANDOM: GLUCOSE: 37 mg/dL — AB (ref 65–99)

## 2015-10-07 LAB — LACTIC ACID, PLASMA: Lactic Acid, Venous: 2.2 mmol/L (ref 0.5–2.0)

## 2015-10-07 MED ORDER — DEXTROSE 50 % IV SOLN
INTRAVENOUS | Status: AC
  Administered 2015-10-07: 25 mL via INTRAVENOUS
  Filled 2015-10-07: qty 50

## 2015-10-07 MED ORDER — DEXTROSE 50 % IV SOLN
INTRAVENOUS | Status: AC
  Administered 2015-10-07: 03:00:00
  Filled 2015-10-07: qty 50

## 2015-10-07 MED ORDER — HYDROCODONE-ACETAMINOPHEN 5-325 MG PO TABS
1.0000 | ORAL_TABLET | ORAL | Status: DC | PRN
  Administered 2015-10-08: 1 via ORAL
  Filled 2015-10-07: qty 1

## 2015-10-07 MED ORDER — GLUCAGON HCL RDNA (DIAGNOSTIC) 1 MG IJ SOLR
3.0000 mg/h | INTRAVENOUS | Status: DC
  Filled 2015-10-07: qty 5

## 2015-10-07 MED ORDER — PANTOPRAZOLE SODIUM 40 MG PO TBEC
40.0000 mg | DELAYED_RELEASE_TABLET | Freq: Every day | ORAL | Status: DC
  Administered 2015-10-08: 40 mg via ORAL
  Filled 2015-10-07 (×2): qty 1

## 2015-10-07 MED ORDER — DEXTROSE 50 % IV SOLN
INTRAVENOUS | Status: AC
  Filled 2015-10-07: qty 50

## 2015-10-07 MED ORDER — ACETAMINOPHEN 650 MG RE SUPP: 650.0000 mg | Freq: Four times a day (QID) | RECTAL | Status: DC | PRN

## 2015-10-07 MED ORDER — SIMVASTATIN 40 MG PO TABS
40.0000 mg | ORAL_TABLET | Freq: Every day | ORAL | Status: DC
  Administered 2015-10-07: 40 mg via ORAL
  Filled 2015-10-07: qty 1

## 2015-10-07 MED ORDER — ACETAMINOPHEN 325 MG PO TABS: 650.0000 mg | ORAL_TABLET | Freq: Four times a day (QID) | ORAL | Status: DC | PRN

## 2015-10-07 MED ORDER — STERILE WATER FOR INJECTION IJ SOLN
INTRAMUSCULAR | Status: AC
  Administered 2015-10-07: 10 mL
  Filled 2015-10-07: qty 10

## 2015-10-07 MED ORDER — DEXTROSE 50 % IV SOLN
25.0000 mL | Freq: Once | INTRAVENOUS | Status: AC
  Administered 2015-10-07: 25 mL via INTRAVENOUS
  Filled 2015-10-07: qty 50

## 2015-10-07 MED ORDER — SODIUM CHLORIDE 0.9 % IV SOLN
1250.0000 mg | INTRAVENOUS | Status: DC
  Administered 2015-10-07 (×2): 1250 mg via INTRAVENOUS
  Filled 2015-10-07 (×5): qty 1250

## 2015-10-07 MED ORDER — SODIUM CHLORIDE 0.9 % IV SOLN: 250.0000 mL | INTRAVENOUS | Status: DC | PRN

## 2015-10-07 MED ORDER — LORATADINE 10 MG PO TABS
10.0000 mg | ORAL_TABLET | Freq: Every day | ORAL | Status: DC
Start: 2015-10-07 — End: 2015-10-08
  Administered 2015-10-08: 10 mg via ORAL
  Filled 2015-10-07 (×2): qty 1

## 2015-10-07 MED ORDER — DEXTROSE 50 % IV SOLN
1.0000 | Freq: Once | INTRAVENOUS | Status: AC
  Administered 2015-10-07: 50 mL via INTRAVENOUS

## 2015-10-07 MED ORDER — FUROSEMIDE 20 MG PO TABS
20.0000 mg | ORAL_TABLET | Freq: Two times a day (BID) | ORAL | Status: DC
  Administered 2015-10-07 – 2015-10-08 (×2): 20 mg via ORAL
  Filled 2015-10-07 (×3): qty 1

## 2015-10-07 MED ORDER — MORPHINE SULFATE (PF) 2 MG/ML IV SOLN: 2.0000 mg | INTRAVENOUS | Status: DC | PRN

## 2015-10-07 MED ORDER — HYDROCORTISONE NA SUCCINATE PF 100 MG IJ SOLR
50.0000 mg | Freq: Four times a day (QID) | INTRAMUSCULAR | Status: DC
  Administered 2015-10-07 – 2015-10-08 (×2): 50 mg via INTRAVENOUS
  Filled 2015-10-07 (×2): qty 2

## 2015-10-07 MED ORDER — ALBUTEROL SULFATE (2.5 MG/3ML) 0.083% IN NEBU: 2.5000 mg | INHALATION_SOLUTION | RESPIRATORY_TRACT | Status: DC | PRN

## 2015-10-07 MED ORDER — SODIUM CHLORIDE 0.9% FLUSH: 3.0000 mL | INTRAVENOUS | Status: DC | PRN

## 2015-10-07 MED ORDER — PIPERACILLIN-TAZOBACTAM 3.375 G IVPB
3.3750 g | Freq: Three times a day (TID) | INTRAVENOUS | Status: DC
  Administered 2015-10-07 – 2015-10-08 (×4): 3.375 g via INTRAVENOUS
  Filled 2015-10-07 (×8): qty 50

## 2015-10-07 MED ORDER — BISACODYL 5 MG PO TBEC: 5.0000 mg | DELAYED_RELEASE_TABLET | Freq: Every day | ORAL | Status: DC | PRN

## 2015-10-07 MED ORDER — GLUCAGON HCL RDNA (DIAGNOSTIC) 1 MG IJ SOLR
5.0000 mg | Freq: Once | INTRAVENOUS | Status: DC
  Filled 2015-10-07: qty 5

## 2015-10-07 MED ORDER — AMIODARONE HCL 200 MG PO TABS
400.0000 mg | ORAL_TABLET | Freq: Every day | ORAL | Status: DC
  Administered 2015-10-07 – 2015-10-08 (×2): 400 mg via ORAL
  Filled 2015-10-07 (×2): qty 2

## 2015-10-07 MED ORDER — SODIUM CHLORIDE 0.9% FLUSH
3.0000 mL | Freq: Two times a day (BID) | INTRAVENOUS | Status: DC
  Administered 2015-10-07 – 2015-10-08 (×3): 3 mL via INTRAVENOUS

## 2015-10-07 MED ORDER — DEXTROSE 50 % IV SOLN
25.0000 mL | Freq: Once | INTRAVENOUS | Status: AC
  Administered 2015-10-07: 25 mL via INTRAVENOUS

## 2015-10-07 MED ORDER — HYDROCORTISONE NA SUCCINATE PF 100 MG IJ SOLR
50.0000 mg | Freq: Three times a day (TID) | INTRAMUSCULAR | Status: DC
  Administered 2015-10-07: 50 mg via INTRAVENOUS
  Filled 2015-10-07: qty 2

## 2015-10-07 MED ORDER — ONDANSETRON HCL 4 MG PO TABS: 4.0000 mg | ORAL_TABLET | Freq: Four times a day (QID) | ORAL | Status: DC | PRN

## 2015-10-07 MED ORDER — POLYETHYLENE GLYCOL 3350 17 G PO PACK: 17.0000 g | PACK | Freq: Every day | ORAL | Status: DC | PRN

## 2015-10-07 MED ORDER — LORAZEPAM 2 MG/ML IJ SOLN
2.0000 mg | INTRAMUSCULAR | Status: DC | PRN
Start: 2015-10-07 — End: 2015-10-08

## 2015-10-07 MED ORDER — GLUCAGON HCL RDNA (DIAGNOSTIC) 1 MG IJ SOLR
1.0000 mg | Freq: Once | INTRAMUSCULAR | Status: AC | PRN
  Administered 2015-10-07: 1 mg via INTRAVENOUS
  Filled 2015-10-07 (×2): qty 1

## 2015-10-07 MED ORDER — DEXTROSE 10 % IV SOLN: INTRAVENOUS | Status: DC

## 2015-10-07 MED ORDER — DEXTROSE 10 % IV SOLN
INTRAVENOUS | Status: DC
  Administered 2015-10-07: 1 mL via INTRAVENOUS
  Administered 2015-10-07 (×2): via INTRAVENOUS

## 2015-10-07 MED ORDER — METOPROLOL SUCCINATE ER 25 MG PO TB24
25.0000 mg | ORAL_TABLET | Freq: Every day | ORAL | Status: DC
  Administered 2015-10-07 – 2015-10-08 (×2): 25 mg via ORAL
  Filled 2015-10-07 (×2): qty 1

## 2015-10-07 MED ORDER — GLUCAGON HCL RDNA (DIAGNOSTIC) 1 MG IJ SOLR
1.0000 mg | INTRAMUSCULAR | Status: AC | PRN
  Administered 2015-10-07: 1 mg via INTRAVENOUS
  Filled 2015-10-07 (×2): qty 1

## 2015-10-07 MED ORDER — ONDANSETRON HCL 4 MG/2ML IJ SOLN: 4.0000 mg | Freq: Four times a day (QID) | INTRAMUSCULAR | Status: DC | PRN

## 2015-10-07 MED ORDER — DOCUSATE SODIUM 100 MG PO CAPS
100.0000 mg | ORAL_CAPSULE | Freq: Two times a day (BID) | ORAL | Status: DC
  Administered 2015-10-07 – 2015-10-08 (×2): 100 mg via ORAL
  Filled 2015-10-07 (×3): qty 1

## 2015-10-07 MED ORDER — HYDROCORTISONE NA SUCCINATE PF 100 MG IJ SOLR: 50.0000 mg | Freq: Three times a day (TID) | INTRAMUSCULAR | Status: DC

## 2015-10-07 NOTE — Care Management (Signed)
Readmitted within 48 hours of discharge.  Patient and his wife declined any home health.  Patient was to have been fitted with [place] at home the day of discharge for ventricular tachycardia but chose to leave the hospital without it. [employer] was to have gone to patient's residence to fit the vest.  There is no mention of the vest in the h/p.  Patient presented with seizure thought to be due to low blood sugar which did not respond to D50.  He has no history of seizure disorder.  Neuro consult pending.  Patient also had intraabdominal abscess that was treated last admission and at one point it was thought patient was going to require home IV antibiotics (patient and wife had refused skilled nursing placement) but wife was ambivalent regarding willingness to assume administration responsibilities. Radiology report indicates that abdominal abscess may be larger in size.

## 2015-10-07 NOTE — H&P (Addendum)
[facility] NAME: Nicholas Parker    MR#:  [id number]  DATE OF BIRTH:  February 11, 1934  DATE OF ADMISSION:  10/06/2015  PRIMARY CARE PHYSICIAN: Adin Hector, MD   REQUESTING/REFERRING PHYSICIAN: Dr. Archie Balboa  CHIEF COMPLAINT:   Chief Complaint  Patient presents with  . Seizures    HISTORY OF PRESENT ILLNESS:  Nicholas Parker  is a 82 y.o. male with a known history of hypertension, diabetes, CAD, colon resection recently presents to the emergency room due to an episode of seizure. Patient was recently in the hospital for intra-abdominal abscess and was discharged on ampicillin after cultures grew enterococcus and Escherichia coli. Patient had CT-guided catheter placement with drainage which was removed prior to discharge. Today he is afebrile. Her sugars were found to be low at 12. In spite of multiple rounds of D50 blood sugars are still low at 43. White count elevated at 12.6. CT scan of the abdomen repeated today in the emergency room shows the intra-abdominal right-sided abscess to be larger. Patient is alert and oriented 3 and complains of some right-sided abdominal pain at this time. Decreased appetite at home.  PAST MEDICAL HISTORY:   Past Medical History  Diagnosis Date  . Hypertension   . Diabetes mellitus without complication ([place])   . Hyperlipidemia   . Coronary artery disease   . neuropathy   . Low testosterone   . Neuropathy ([place])   . Anemia   . B12 deficiency   . GERD (gastroesophageal reflux disease)   . Skin cancer     Basal Cell  . Adenocarcinoma ([place])   . AAA (abdominal aortic aneurysm) ([employer])   . Diverticulosis     PAST SURGICAL HISTORY:   Past Surgical History  Procedure Laterality Date  . Arthrosopic rotator cuff repair Left   . Robotic colectomy    . Coronary angioplasty    . Vascular surgery    . Colonoscopy with propofol N/A 04/13/2015    Procedure: COLONOSCOPY WITH PROPOFOL;  Surgeon: Lollie Sails, MD;  Location: [facility] ENDOSCOPY;  Service: Endoscopy;  Laterality: N/A;  . Colonoscopy with propofol N/A 04/14/2015    Procedure: COLONOSCOPY WITH PROPOFOL;  Surgeon: Lollie Sails, MD;  Location: [facility] ENDOSCOPY;  Service: Endoscopy;  Laterality: N/A;  . Colonoscopy with propofol N/A 07/20/2015    Procedure: COLONOSCOPY WITH PROPOFOL;  Surgeon: Lollie Sails, MD;  Location: [facility] ENDOSCOPY;  Service: Endoscopy;  Laterality: N/A;  . Colon surgery    . Eye surgery Bilateral     Cataract Extraction with IOL  . Abdominal aortic aneurysm repair  2014    Dr. Delana Meyer, [facility]  . Laparoscopic right colectomy Right 09/14/2015    Procedure: LAPAROSCOPIC RIGHT COLECTOMY  CONVERTED TO OPEN RIGHT COLECTOMY, LYSIS OF ADHESIONS;  Surgeon: Leonie Green, MD;  Location: [facility] ORS;  Service: General;  Laterality: Right;  . Cardiac catheterization N/A 09/17/2015    Procedure: Left Heart Cath and Coronary Angiography;  Surgeon: Wellington Hampshire, MD;  Location: [place] CV LAB;  Service: Cardiovascular;  Laterality: N/A;  . Cardiac catheterization N/A 09/17/2015    Procedure: Coronary Stent Intervention;  Surgeon: Wellington Hampshire, MD;  Location: [place] CV LAB;  Service: Cardiovascular;  Laterality: N/A;    SOCIAL HISTORY:   Social History  Substance Use Topics  . Smoking status: Former Smoker -- 1.50 packs/day    Types: Cigarettes    Quit date: 10/03/2003  .  Smokeless tobacco: Never Used  . Alcohol Use: 0.6 oz/week    1 Cans of beer per week    FAMILY HISTORY:  No family history on file.  DRUG ALLERGIES:   Allergies  Allergen Reactions  . Lipitor [Atorvastatin] Other (See Comments)    Reaction:  Leg cramps   . Pravachol [Pravastatin Sodium] Other (See Comments)    Reaction:  Leg cramps  . Riomet [Metformin Hcl] Diarrhea  . Vytorin [Ezetimibe-Simvastatin] Other (See Comments)    Reaction:  Leg cramps    REVIEW OF SYSTEMS:   Review of Systems  Constitutional:  Positive for chills and malaise/fatigue. Negative for fever and weight loss.  HENT: Negative for hearing loss and nosebleeds.   Eyes: Negative for blurred vision, double vision and pain.  Respiratory: Positive for shortness of breath. Negative for cough, hemoptysis, sputum production and wheezing.   Cardiovascular: Positive for leg swelling. Negative for chest pain, palpitations and orthopnea.  Gastrointestinal: Positive for nausea. Negative for vomiting, abdominal pain, diarrhea and constipation.  Genitourinary: Negative for dysuria and hematuria.  Musculoskeletal: Positive for back pain. Negative for myalgias and falls.  Skin: Negative for rash.  Neurological: Positive for dizziness, seizures, loss of consciousness and weakness. Negative for tremors, sensory change, speech change, focal weakness and headaches.  Endo/Heme/Allergies: Does not bruise/bleed easily.  Psychiatric/Behavioral: Negative for depression and memory loss. The patient is not nervous/anxious.     MEDICATIONS AT HOME:   Prior to Admission medications   Medication Sig Start Date End Date Taking? Authorizing Provider  amiodarone (PACERONE) 400 MG tablet Take 1 tablet (400 mg total) by mouth daily. 10/04/15  Yes Epifanio Lesches, MD  ampicillin (PRINCIPEN) 500 MG capsule Take 1 capsule (500 mg total) by mouth 4 (four) times daily. 10/04/15  Yes Epifanio Lesches, MD  aspirin EC 81 MG tablet Take 81 mg by mouth daily.   Yes Historical Provider, MD  clopidogrel (PLAVIX) 75 MG tablet Take 1 tablet (75 mg total) by mouth daily with breakfast. 09/21/15  Yes Leonie Green, MD  furosemide (LASIX) 20 MG tablet Take 1 tablet (20 mg total) by mouth 2 (two) times daily. 10/04/15  Yes Epifanio Lesches, MD  glimepiride (AMARYL) 4 MG tablet Take 4 mg by mouth daily with breakfast.   Yes Historical Provider, MD  lisinopril (PRINIVIL,ZESTRIL) 5 MG tablet Take 5 mg by mouth daily.   Yes Historical Provider, MD  loratadine (CLARITIN)  10 MG tablet Take 10 mg by mouth daily.   Yes Historical Provider, MD  metFORMIN (GLUCOPHAGE) 500 MG tablet Take 500 mg by mouth 2 (two) times daily with a meal.    Yes Historical Provider, MD  metoprolol succinate (TOPROL-XL) 25 MG 24 hr tablet Take 25 mg by mouth daily.   Yes Historical Provider, MD  oxyCODONE-acetaminophen (PERCOCET/ROXICET) 5-325 MG tablet Take 1 tablet by mouth every 4 (four) hours as needed for severe pain.   Yes Historical Provider, MD  pantoprazole (PROTONIX) 40 MG tablet Take 40 mg by mouth daily.    Yes Historical Provider, MD  simvastatin (ZOCOR) 40 MG tablet Take 40 mg by mouth at bedtime.    Yes Historical Provider, MD  vitamin B-12 (CYANOCOBALAMIN) 500 MCG tablet Take 500 mcg by mouth daily.   Yes Historical Provider, MD      VITAL SIGNS:  Blood pressure 103/48, pulse 69, temperature 97.7 F (36.5 C), temperature source Oral, resp. rate 24, weight 81.647 kg (180 lb), SpO2 100 %.  PHYSICAL EXAMINATION:  Physical Exam  GENERAL:  82 y.o.-year-old patient lying in the bed with no acute distress.  EYES: Pupils equal, round, reactive to light and accommodation. No scleral icterus. Extraocular muscles intact.  HEENT: Head atraumatic, normocephalic. Oropharynx and nasopharynx clear. No oropharyngeal erythema, moist oral mucosa  NECK:  Supple, no jugular venous distention. No thyroid enlargement, no tenderness.  LUNGS: Normal breath sounds bilaterally, no wheezing, rales, rhonchi. No use of accessory muscles of respiration. LifeVest on CARDIOVASCULAR: S1, S2 normal. No murmurs, rubs, or gallops.  ABDOMEN: Soft. Scar from recent surgeries well-healed. Tenderness on the right side. Bowel sounds normal. EXTREMITIES: No  cyanosis, or clubbing. + 2 pedal & radial pulses b/l.  2+ lower extremity edema NEUROLOGIC: Cranial nerves II through XII are intact. No focal Motor or sensory deficits appreciated b/l PSYCHIATRIC: The patient is alert and oriented x 3. Good affect.   SKIN: No obvious rash, lesion, or ulcer.   LABORATORY PANEL:   CBC  Recent Labs Lab 10/06/15 2032  WBC 12.6*  HGB 9.2*  HCT 31.4*  PLT 273   ------------------------------------------------------------------------------------------------------------------  Chemistries   Recent Labs Lab 09/30/15 0528  10/06/15 1824  NA 141  < > 139  K 3.4*  < > 3.2*  CL 106  < > 101  CO2 30  < > 28  GLUCOSE 124*  < > 89  BUN 15  < > 14  CREATININE 0.84  < > 1.12  CALCIUM 8.0*  < > 8.3*  MG 1.8  --   --   AST  --   --  23  ALT  --   --  12*  ALKPHOS  --   --  57  BILITOT  --   --  0.5  < > = values in this interval not displayed. ------------------------------------------------------------------------------------------------------------------  Cardiac Enzymes  Recent Labs Lab 10/06/15 1824  TROPONINI 0.33*   ------------------------------------------------------------------------------------------------------------------  RADIOLOGY:  Ct Head Wo Contrast  10/07/2015  CLINICAL DATA:  Acute onset of seizure. Tremors. Hypoglycemia. Initial encounter. EXAM: CT HEAD WITHOUT CONTRAST TECHNIQUE: Contiguous axial images were obtained from the base of the skull through the vertex without intravenous contrast. COMPARISON:  None. FINDINGS: There is no evidence of acute infarction, mass lesion, or intra- or extra-axial hemorrhage on CT. Prominence of the ventricles and sulci reflects mild cortical volume loss. Mild periventricular white matter change likely reflects small vessel ischemic microangiopathy. Mild cerebellar atrophy is noted. The brainstem and fourth ventricle are within normal limits. The basal ganglia are unremarkable in appearance. The cerebral hemispheres demonstrate grossly normal gray-white differentiation. No mass effect or midline shift is seen. There is no evidence of fracture; visualized osseous structures are unremarkable in appearance. The orbits are within normal limits.  The paranasal sinuses and mastoid air cells are well-aerated. No significant soft tissue abnormalities are seen. IMPRESSION: 1. No acute intracranial pathology seen on CT. 2. Mild cortical volume loss and scattered small vessel ischemic microangiopathy. Electronically Signed   By: Garald Balding M.D.   On: 10/07/2015 01:04   Ct Abdomen Pelvis W Contrast  10/06/2015  CLINICAL DATA:  Lower abdominal pain. EXAM: CT ABDOMEN AND PELVIS WITH CONTRAST TECHNIQUE: Multidetector CT imaging of the abdomen and pelvis was performed using the standard protocol following bolus administration of intravenous contrast. CONTRAST:  110m OMNIPAQUE IOHEXOL 300 MG/ML  SOLN COMPARISON:  CT scan of September 29, 2015. FINDINGS: Mild right pleural effusion is noted with adjacent subsegmental atelectasis. No acute osseous abnormality is noted. Cholelithiasis is noted without inflammation. The liver,  spleen and pancreas appear normal. Adrenal glands and kidneys appear normal. No hydronephrosis or renal obstruction is noted. Status post endovascular repair of abdominal aortic aneurysm. The graft and its limbs are patent without evidence of endoleak. Urinary bladder appears normal. Prostatic calcifications are noted. There is no evidence of bowel obstruction. Status post right colectomy. There remains abscess along the right lateral wall inferior to the liver which measures 14.6 by 5.2 x 3.1 cm. No catheter is noted within it. No significant adenopathy is noted. IMPRESSION: Cholelithiasis. Status post endovascular repair of infrarenal abdominal aortic aneurysm. No endoleak is noted. Mild right pleural effusion with adjacent subsegmental atelectasis. Status post right colectomy. 14.6 x 5.2 x 3.1 cm abscess remains along the right lateral chest wall and extending into right pericolic gutter. Electronically Signed   By: Marijo Conception, M.D.   On: 10/06/2015 21:53     IMPRESSION AND PLAN:   * Hypoglycemia Patient has had poor appetite. But  significantly low blood sugars in spite of D50. Give a stat dose of D50 again. Start D10 at 50 ML per hour. Accu-Cheks every hour. Patient will be admitted to stepdown unit. Hold oral hypoglycemics. Patient will be on regular diet.  * Seizure Likely from hypoglycemia. No prior history of seizures. CT scan of the head showed nothing acute. Will place patient on seizure precautions. Ativan when necessary if any further seizures. Will need to consult neurology and look for other etiologies if patient has further episodes of seizure.  * Intra abdominal abscess s/p colon resection The abscess seems larger as per radiologist description. Consult surgery Dr. Tamala Julian for further input. We will also consult interventional radiology for drainage catheter placement. Start broad-spectrum antibiotics with vancomycin and Zosyn. Recent cultures had enterococcus and Escherichia coli.  * CAD Patient had an RCA drug-eluting stent placed on 09/17/2015. He took his Plavix earlier today. I have held her Plavix so that patient can get his IR procedure. Needs to be resumed immediately after the procedure. Resume Plavix if patient is unable to get the procedure.  * HTN Hold medications due to borderline low blood pressure.  * Chronic systolic chf with EF 49% Life vest on Hold lasix  * DVT prophylaxis with SCDs Lovenox not started while waiting for interventional radiology to drain the abscess.    All the records are reviewed and case discussed with ED provider. Management plans discussed with the patient, family and they are in agreement.  CODE STATUS: FULL  TOTAL CC TIME TAKING CARE OF THIS PATIENT: 50 minutes.    Hillary Bow R M.D on 10/07/2015 at 1:39 AM  Between 7am to 6pm - Pager - [phone]  After 6pm go to [email or web address] - password [email or web address] [place] [facility]  Office  [phone]  CC: Primary care physician; Adin Hector, MD     Note: This dictation was prepared  with Dragon dictation along with smaller phrase technology. Any transcriptional errors that result from this process are unintentional.

## 2015-10-07 NOTE — Consult Note (Signed)
Endocrine Initial Consult Note Date of Consult: 10/07/2015  Consulting Service: [facility] Endocrinology  Service Requesting Consult: Dr. Manuella Ghazi  SUBJECTIVE: Reason for Consultation: persistent hypoglycemia  History of Present Illness: Nicholas Parker is a 82 y.o. male with a PMH hypertension, type 2 diabetes, CAD (s/p MI recently), s/p Colon resection complicated by intraabdominal abscess in the past month who was admitted with hypoglycemia after seizure at home. He was [facility] home from [facility] just 2 days prior. He was taking ampicillin. He reports his appetite was poor at home and he was feeling more sleepy than usual. On the morning of admission, his BG was 74 upon waking. He took his usual regimen of metformin 500 mg and glimepiride (unknown dose). He ate his usual amount and went to take a nap on the couch at some point in the afternoon. Then he woke up feeling a bit lightheaded. He asked his wife for soda. While seated at the counter, his wife witnessed him slumped over and jerking, unresponsive. She called EMS. Upon arrival to the ED, BG was 12 despite receiving treatment by EMS.   He has had type 2 diabetes for at least 10 years now.  He is followed by Dr. [name]. He normally takes 500 mg metformin twice daily and 4 mg glimepiride once daily. Hb A1c was 6.4% in 07/2015. The patient reports occassional episodes of hypoglycemia but this has never been a major issue. His last dose of glimepiride was taken yesterday morning.  Upon review of systems, he endorses mild abdominal pain. Denies nausea or vomiting. Denies fevers, chills, sweats. He has no major complaints. Remainder of the 10 pt ROS was negative.   Patient Active Problem List   Diagnosis Date Noted  . Hypoglycemia 10/07/2015  . Renal failure 09/24/2015  . Acute ST elevation myocardial infarction (STEMI) involving right coronary artery ([place])   . Colon polyp 09/14/2015     Past Medical History  Diagnosis Date  .  Hypertension   . Diabetes mellitus without complication ([place])   . Hyperlipidemia   . Coronary artery disease   . neuropathy   . Low testosterone   . Neuropathy ([place])   . Anemia   . B12 deficiency   . GERD (gastroesophageal reflux disease)   . Skin cancer     Basal Cell  . Adenocarcinoma ([place])   . AAA (abdominal aortic aneurysm) ([place])   . Diverticulosis    Past Surgical History  Procedure Laterality Date  . Arthrosopic rotator cuff repair Left   . Robotic colectomy    . Coronary angioplasty    . Vascular surgery    . Colonoscopy with propofol N/A 04/13/2015    Procedure: COLONOSCOPY WITH PROPOFOL;  Surgeon: Lollie Sails, MD;  Location: [facility] ENDOSCOPY;  Service: Endoscopy;  Laterality: N/A;  . Colonoscopy with propofol N/A 04/14/2015    Procedure: COLONOSCOPY WITH PROPOFOL;  Surgeon: Lollie Sails, MD;  Location: [facility] ENDOSCOPY;  Service: Endoscopy;  Laterality: N/A;  . Colonoscopy with propofol N/A 07/20/2015    Procedure: COLONOSCOPY WITH PROPOFOL;  Surgeon: Lollie Sails, MD;  Location: [facility] ENDOSCOPY;  Service: Endoscopy;  Laterality: N/A;  . Colon surgery    . Eye surgery Bilateral     Cataract Extraction with IOL  . Abdominal aortic aneurysm repair  2014    Dr. Delana Meyer, [facility]  . Laparoscopic right colectomy Right 09/14/2015    Procedure: LAPAROSCOPIC RIGHT COLECTOMY  CONVERTED TO OPEN RIGHT COLECTOMY, LYSIS OF ADHESIONS;  Surgeon: Hillery Aldo  Tamala Julian, MD;  Location: [facility] ORS;  Service: General;  Laterality: Right;  . Cardiac catheterization N/A 09/17/2015    Procedure: Left Heart Cath and Coronary Angiography;  Surgeon: Wellington Hampshire, MD;  Location: [place] CV LAB;  Service: Cardiovascular;  Laterality: N/A;  . Cardiac catheterization N/A 09/17/2015    Procedure: Coronary Stent Intervention;  Surgeon: Wellington Hampshire, MD;  Location: [place] CV LAB;  Service: Cardiovascular;  Laterality: N/A;   No family history on file.  Social History:  Social  History  Substance Use Topics  . Smoking status: Former Smoker -- 1.50 packs/day    Types: Cigarettes    Quit date: 10/03/2003  . Smokeless tobacco: Never Used  . Alcohol Use: 0.6 oz/week    1 Cans of beer per week     Allergies  Allergen Reactions  . Lipitor [Atorvastatin] Other (See Comments)    Reaction:  Leg cramps   . Pravachol [Pravastatin Sodium] Other (See Comments)    Reaction:  Leg cramps  . Riomet [Metformin Hcl] Diarrhea  . Vytorin [Ezetimibe-Simvastatin] Other (See Comments)    Reaction:  Leg cramps     Medications:  No current facility-administered medications on file prior to encounter.   Current Outpatient Prescriptions on File Prior to Encounter  Medication Sig Dispense Refill  . amiodarone (PACERONE) 400 MG tablet Take 1 tablet (400 mg total) by mouth daily. 30 tablet 0  . ampicillin (PRINCIPEN) 500 MG capsule Take 1 capsule (500 mg total) by mouth 4 (four) times daily. 28 capsule 0  . aspirin EC 81 MG tablet Take 81 mg by mouth daily.    . clopidogrel (PLAVIX) 75 MG tablet Take 1 tablet (75 mg total) by mouth daily with breakfast. 30 tablet 11  . furosemide (LASIX) 20 MG tablet Take 1 tablet (20 mg total) by mouth 2 (two) times daily. 4 tablet 0  . glimepiride (AMARYL) 4 MG tablet Take 4 mg by mouth daily with breakfast.    . loratadine (CLARITIN) 10 MG tablet Take 10 mg by mouth daily.    . metFORMIN (GLUCOPHAGE) 500 MG tablet Take 500 mg by mouth 2 (two) times daily with a meal.     . metoprolol succinate (TOPROL-XL) 25 MG 24 hr tablet Take 25 mg by mouth daily.    . pantoprazole (PROTONIX) 40 MG tablet Take 40 mg by mouth daily.     . simvastatin (ZOCOR) 40 MG tablet Take 40 mg by mouth at bedtime.       OBJECTIVE: Temp:  [97.7 F (36.5 C)-98.6 F (37 C)] 98.5 F (36.9 C) (02/09 1500) Pulse Rate:  [64-81] 64 (02/09 1500) Resp:  [14-32] 22 (02/09 1500) BP: (79-125)/(41-69) 105/53 mmHg (02/09 1500) SpO2:  [93 %-100 %] 100 % (02/09 1500) Weight:   [74.7 kg (164 lb 10.9 oz)-81.647 kg (180 lb)] 74.7 kg (164 lb 10.9 oz) (02/09 0500)  Temp (24hrs), Avg:98.2 F (36.8 C), Min:97.7 F (36.5 C), Max:98.6 F (37 C)  Weight: 74.7 kg (164 lb 10.9 oz)  Physical Exam: Gen: no acute distress, well-nourished, well-appearing elderly man HEENT: [place]/AT, eyes anicteric, EOMI, mucous membranes moist, no oropharyngeal lesions Neck: no thyroid enlargement or nodules noted, no cervical lymphadenopathy CAD: regular rate, regular rhythm, s1, s2 PULM: clear to ausculation anteriorly. GI: soft, non tender, non distended, normoactive bowel sounds. EXT: no clubbing, cyanosis or edema, no foot lesions or ulcerations  Skin: warm, dry, no rash Neuro: grossly non focal, normal DTRs, alert and oriented x 3  Labs:  CBC Latest Ref Rng 10/07/2015 10/06/2015 10/03/2015  WBC 3.8 - 10.6 K/uL 10.8(H) 12.6(H) 7.8  Hemoglobin 13.0 - 18.0 g/dL 9.8(L) 9.2(L) 9.4(L)  Hematocrit 40.0 - 52.0 % 30.0(L) 31.4(L) 29.2(L)  Platelets 150 - 440 K/uL 286 273 302   BMP Latest Ref Rng 10/07/2015 10/07/2015 10/06/2015  Glucose 65 - 99 mg/dL 37(LL) 73 89  BUN 6 - 20 mg/dL - 10 14  Creatinine 0.61 - 1.24 mg/dL - 1.10 1.12  Sodium 135 - 145 mmol/L - 138 139  Potassium 3.5 - 5.1 mmol/L - 3.8 3.2(L)  Chloride 101 - 111 mmol/L - 100(L) 101  CO2 22 - 32 mmol/L - 33(H) 28  Calcium 8.9 - 10.3 mg/dL - 8.2(L) 8.3(L)   Blood glucose values reviewed in glucose accordion view  ASSESSMENT:  1. Hypoglycemia  2. Controlled type 2 Diabetes 3. Seizures in the setting of hypoglycemia 4. Intraabdominal abscess  RECOMMENDATIONS:  Recommend discontinuation of his sulfonylurea.  Hold metformin while inpatient. Can be restarted upon discharge. Continue D10 gtt at 125 mL/hour. Treat per hypoglycemia protocol.  q1 hour glucometer checks until BG stabilizes above 70 mg/dL x 3 consecutive hours. Then reduce to glucometer checks q2-3hours. Start IV hydrocortisone 50 mg every 8 hours. Allow liberal diet now  that he is not NPO for procedure. Encourage patient to drink regular soda or juice to keep BG>70. Seizure precautions. Defer management of his intraabdominal abscess to primary team. Agree with postponing drainage until BG stabilizes. Patient will be seen by Dr. Gabriel Carina tomorrow. Please call if any questions or concerns.  Thank you for allowing me to participate in this patient's care.   Atha Starks, MD [facility] Endocrinology

## 2015-10-07 NOTE — ED Notes (Signed)
Patient transported to CT 

## 2015-10-07 NOTE — Progress Notes (Signed)
Pt CBG rechecked at resulted at 60. D50 given per protocol. Dr.Simonds updated as to pt condition and repeated hypoglycemic episodes.  No new orders given at this time. Will recheck CBG in 15 min  Will continue to assess.

## 2015-10-07 NOTE — Consult Note (Signed)
Reason for Consult:Seizure Referring Physician: Manuella Ghazi  CC: Seizure  HPI: Nicholas Parker is an 82 y.o. male s/p treatment for abdominal abscess who was noted by family to be having a seizure at home.  EMS was called and patient as found to have a CBG of 12.  EMS witnessed a seizure.  Versed was administered and patient was brought to [facility] for further treatment.  Patient has not had any further seizures.  Has no history of seizures.    Past Medical History  Diagnosis Date  . Hypertension   . Diabetes mellitus without complication ([place])   . Hyperlipidemia   . Coronary artery disease   . neuropathy   . Low testosterone   . Neuropathy ([place])   . Anemia   . B12 deficiency   . GERD (gastroesophageal reflux disease)   . Skin cancer     Basal Cell  . Adenocarcinoma ([place])   . AAA (abdominal aortic aneurysm) ([place])   . Diverticulosis     Past Surgical History  Procedure Laterality Date  . Arthrosopic rotator cuff repair Left   . Robotic colectomy    . Coronary angioplasty    . Vascular surgery    . Colonoscopy with propofol N/A 04/13/2015    Procedure: COLONOSCOPY WITH PROPOFOL;  Surgeon: Lollie Sails, MD;  Location: [facility] ENDOSCOPY;  Service: Endoscopy;  Laterality: N/A;  . Colonoscopy with propofol N/A 04/14/2015    Procedure: COLONOSCOPY WITH PROPOFOL;  Surgeon: Lollie Sails, MD;  Location: [facility] ENDOSCOPY;  Service: Endoscopy;  Laterality: N/A;  . Colonoscopy with propofol N/A 07/20/2015    Procedure: COLONOSCOPY WITH PROPOFOL;  Surgeon: Lollie Sails, MD;  Location: [facility] ENDOSCOPY;  Service: Endoscopy;  Laterality: N/A;  . Colon surgery    . Eye surgery Bilateral     Cataract Extraction with IOL  . Abdominal aortic aneurysm repair  2014    Dr. Delana Meyer, [facility]  . Laparoscopic right colectomy Right 09/14/2015    Procedure: LAPAROSCOPIC RIGHT COLECTOMY  CONVERTED TO OPEN RIGHT COLECTOMY, LYSIS OF ADHESIONS;  Surgeon: Leonie Green, MD;  Location: [facility] ORS;  Service:  General;  Laterality: Right;  . Cardiac catheterization N/A 09/17/2015    Procedure: Left Heart Cath and Coronary Angiography;  Surgeon: Wellington Hampshire, MD;  Location: [place] CV LAB;  Service: Cardiovascular;  Laterality: N/A;  . Cardiac catheterization N/A 09/17/2015    Procedure: Coronary Stent Intervention;  Surgeon: Wellington Hampshire, MD;  Location: [place] CV LAB;  Service: Cardiovascular;  Laterality: N/A;    No family history on file.  Social History:  reports that he quit smoking about 12 years ago. His smoking use included Cigarettes. He smoked 1.50 packs per day. He has never used smokeless tobacco. He reports that he drinks about 0.6 oz of alcohol per week. He reports that he does not use illicit drugs.  Allergies  Allergen Reactions  . Lipitor [Atorvastatin] Other (See Comments)    Reaction:  Leg cramps   . Pravachol [Pravastatin Sodium] Other (See Comments)    Reaction:  Leg cramps  . Riomet [Metformin Hcl] Diarrhea  . Vytorin [Ezetimibe-Simvastatin] Other (See Comments)    Reaction:  Leg cramps    Medications:  I have reviewed the patient's current medications. Prior to Admission:  Prescriptions prior to admission  Medication Sig Dispense Refill Last Dose  . amiodarone (PACERONE) 400 MG tablet Take 1 tablet (400 mg total) by mouth daily. 30 tablet 0 10/06/2015 at Unknown time  .  ampicillin (PRINCIPEN) 500 MG capsule Take 1 capsule (500 mg total) by mouth 4 (four) times daily. 28 capsule 0 10/06/2015 at Unknown time  . aspirin EC 81 MG tablet Take 81 mg by mouth daily.   10/06/2015 at 1000  . clopidogrel (PLAVIX) 75 MG tablet Take 1 tablet (75 mg total) by mouth daily with breakfast. 30 tablet 11 10/06/2015 at 1000  . furosemide (LASIX) 20 MG tablet Take 1 tablet (20 mg total) by mouth 2 (two) times daily. 4 tablet 0 10/06/2015 at Unknown time  . glimepiride (AMARYL) 4 MG tablet Take 4 mg by mouth daily with breakfast.   10/06/2015 at Unknown time  . lisinopril  (PRINIVIL,ZESTRIL) 5 MG tablet Take 5 mg by mouth daily.   10/06/2015 at Unknown time  . loratadine (CLARITIN) 10 MG tablet Take 10 mg by mouth daily.   10/06/2015 at Unknown time  . metFORMIN (GLUCOPHAGE) 500 MG tablet Take 500 mg by mouth 2 (two) times daily with a meal.    10/06/2015 at Unknown time  . metoprolol succinate (TOPROL-XL) 25 MG 24 hr tablet Take 25 mg by mouth daily.   10/06/2015 at 1000  . oxyCODONE-acetaminophen (PERCOCET/ROXICET) 5-325 MG tablet Take 1 tablet by mouth every 4 (four) hours as needed for severe pain.   10/05/2015 at 2315  . pantoprazole (PROTONIX) 40 MG tablet Take 40 mg by mouth daily.    10/06/2015 at Unknown time  . simvastatin (ZOCOR) 40 MG tablet Take 40 mg by mouth at bedtime.    10/05/2015 at Unknown time  . vitamin B-12 (CYANOCOBALAMIN) 500 MCG tablet Take 500 mcg by mouth daily.   10/06/2015 at Unknown time   Scheduled: . amiodarone  400 mg Oral Daily  . docusate sodium  100 mg Oral BID  . furosemide  20 mg Oral BID  . glucagon (GLUCAGEN)  IVPB (bolus for beta blocker/calcium channel bocker overdose)  5 mg Intravenous Once  . hydrocortisone sod succinate (SOLU-CORTEF) inj  50 mg Intravenous Q6H  . loratadine  10 mg Oral Daily  . metoprolol succinate  25 mg Oral Daily  . pantoprazole  40 mg Oral Daily  . piperacillin-tazobactam (ZOSYN)  IV  3.375 g Intravenous 3 times per day  . simvastatin  40 mg Oral QHS  . sodium chloride flush  3 mL Intravenous Q12H  . sodium chloride flush  3 mL Intravenous Q12H  . vancomycin  1,250 mg Intravenous Q18H    ROS: History obtained from the patient  General ROS: negative for - chills, fatigue, fever, night sweats, weight gain or weight loss Psychological ROS: negative for - behavioral disorder, hallucinations, memory difficulties, mood swings or suicidal ideation Ophthalmic ROS: negative for - blurry vision, double vision, eye pain or loss of vision ENT ROS: negative for - epistaxis, nasal discharge, oral lesions, sore throat,  tinnitus or vertigo Allergy and Immunology ROS: negative for - hives or itchy/watery eyes Hematological and Lymphatic ROS: negative for - bleeding problems, bruising or swollen lymph nodes Endocrine ROS: negative for - galactorrhea, hair pattern changes, polydipsia/polyuria or temperature intolerance Respiratory ROS: negative for - cough, hemoptysis, shortness of breath or wheezing Cardiovascular ROS: lower extremity swelling Gastrointestinal ROS: negative for - abdominal pain, diarrhea, hematemesis, nausea/vomiting or stool incontinence Genito-Urinary ROS: negative for - dysuria, hematuria, incontinence or urinary frequency/urgency Musculoskeletal ROS: negative for - joint swelling or muscular weakness Neurological ROS: as noted in HPI Dermatological ROS: negative for rash and skin lesion changes  Physical Examination: Blood pressure 102/48, pulse  64, temperature 97.8 F (36.6 C), temperature source Oral, resp. rate 23, weight 74.7 kg (164 lb 10.9 oz), SpO2 100 %.  HEENT-  Normocephalic, no lesions, without obvious abnormality.  Normal external eye and conjunctiva.  Normal TM's bilaterally.  Normal auditory canals and external ears. Normal external nose, mucus membranes and septum.  Normal pharynx. Cardiovascular- S1, S2 normal, pulses palpable throughout   Lungs- chest clear, no wheezing, rales, normal symmetric air entry Abdomen- soft, non-tender; bowel sounds normal; no masses,  no organomegaly Extremities- BLE edema Lymph-no adenopathy palpable Musculoskeletal-no joint tenderness, deformity or swelling Skin-warm and dry, no hyperpigmentation, vitiligo, or suspicious lesions  Neurological Examination Mental Status: Alert, oriented, thought content appropriate.  Speech fluent without evidence of aphasia.  Able to follow 3 step commands without difficulty. Cranial Nerves: II: Discs flat bilaterally; Visual fields grossly normal, pupils equal, round, reactive to light and  accommodation III,IV, VI: left ptosis, extra-ocular motions intact bilaterally V,VII: smile symmetric, facial light touch sensation normal bilaterally VIII: hearing normal bilaterally IX,X: gag reflex present XI: bilateral shoulder shrug XII: midline tongue extension Motor: Right : Upper extremity   5/5    Left:     Upper extremity   5/5  Lower extremity   5/5     Lower extremity   5/5 Tone and bulk:normal tone throughout; no atrophy noted Sensory: Pinprick and light touch intact throughout, bilaterally Deep Tendon Reflexes: 2+ and symmetric with absent AJ's bilaterally Plantars: Right: downgoing   Left: downgoing Cerebellar: Normal finger-to-nose and normal heel-to-shin testing bilaterally Gait: not tested due to safety concerns      Laboratory Studies:   Basic Metabolic Panel:  Recent Labs Lab 10/01/15 0556 10/02/15 0555 10/06/15 1824 10/07/15 0645 10/07/15 1421  NA 141 140 139 138  --   K 3.4* 3.9 3.2* 3.8  --   CL 102 99* 101 100*  --   CO2 32 34* 28 33*  --   GLUCOSE 124* 112* 89 73 37*  BUN '14 12 14 10  '$ --   CREATININE 0.90 0.85 1.12 1.10  --   CALCIUM 8.1* 8.3* 8.3* 8.2*  --     Liver Function Tests:  Recent Labs Lab 10/06/15 1824  AST 23  ALT 12*  ALKPHOS 57  BILITOT 0.5  PROT 5.5*  ALBUMIN 2.5*   No results for input(s): LIPASE, AMYLASE in the last 168 hours. No results for input(s): AMMONIA in the last 168 hours.  CBC:  Recent Labs Lab 10/01/15 0556 10/03/15 0500 10/06/15 2032 10/07/15 0645  WBC 7.8 7.8 12.6* 10.8*  HGB 9.3* 9.4* 9.2* 9.8*  HCT 28.6* 29.2* 31.4* 30.0*  MCV 83.4 84.9 93.2 83.3  PLT 313 302 273 286    Cardiac Enzymes:  Recent Labs Lab 10/06/15 1824  TROPONINI 0.33*    BNP: Invalid input(s): POCBNP  CBG:  Recent Labs Lab 10/07/15 1156 10/07/15 1245 10/07/15 1314 10/07/15 1322 10/07/15 1425  GLUCAP 71 54* 60* 356* 23*    Microbiology: Results for orders placed or performed during the hospital  encounter of 10/06/15  MRSA PCR Screening     Status: None   Collection Time: 10/07/15  2:18 AM  Result Value Ref Range Status   MRSA by PCR NEGATIVE NEGATIVE Final    Comment:        The GeneXpert MRSA Assay (FDA approved for NASAL specimens only), is one component of a comprehensive MRSA colonization surveillance program. It is not intended to diagnose MRSA infection nor to guide or  monitor treatment for MRSA infections.     Coagulation Studies: No results for input(s): LABPROT, INR in the last 72 hours.  Urinalysis: No results for input(s): COLORURINE, LABSPEC, PHURINE, GLUCOSEU, HGBUR, BILIRUBINUR, KETONESUR, PROTEINUR, UROBILINOGEN, NITRITE, LEUKOCYTESUR in the last 168 hours.  Invalid input(s): APPERANCEUR  Lipid Panel:  No results found for: CHOL, TRIG, HDL, CHOLHDL, VLDL, LDLCALC  HgbA1C: No results found for: HGBA1C  Urine Drug Screen:  No results found for: LABOPIA, COCAINSCRNUR, LABBENZ, AMPHETMU, THCU, LABBARB  Alcohol Level: No results for input(s): ETH in the last 168 hours.  Other results: EKG: sinus rhythm at 77 bpm.  Imaging: Ct Head Wo Contrast  10/07/2015  CLINICAL DATA:  Acute onset of seizure. Tremors. Hypoglycemia. Initial encounter. EXAM: CT HEAD WITHOUT CONTRAST TECHNIQUE: Contiguous axial images were obtained from the base of the skull through the vertex without intravenous contrast. COMPARISON:  None. FINDINGS: There is no evidence of acute infarction, mass lesion, or intra- or extra-axial hemorrhage on CT. Prominence of the ventricles and sulci reflects mild cortical volume loss. Mild periventricular white matter change likely reflects small vessel ischemic microangiopathy. Mild cerebellar atrophy is noted. The brainstem and fourth ventricle are within normal limits. The basal ganglia are unremarkable in appearance. The cerebral hemispheres demonstrate grossly normal gray-white differentiation. No mass effect or midline shift is seen. There is no  evidence of fracture; visualized osseous structures are unremarkable in appearance. The orbits are within normal limits. The paranasal sinuses and mastoid air cells are well-aerated. No significant soft tissue abnormalities are seen. IMPRESSION: 1. No acute intracranial pathology seen on CT. 2. Mild cortical volume loss and scattered small vessel ischemic microangiopathy. Electronically Signed   By: Garald Balding M.D.   On: 10/07/2015 01:04   Ct Abdomen Pelvis W Contrast  10/06/2015  CLINICAL DATA:  Lower abdominal pain. EXAM: CT ABDOMEN AND PELVIS WITH CONTRAST TECHNIQUE: Multidetector CT imaging of the abdomen and pelvis was performed using the standard protocol following bolus administration of intravenous contrast. CONTRAST:  123m OMNIPAQUE IOHEXOL 300 MG/ML  SOLN COMPARISON:  CT scan of September 29, 2015. FINDINGS: Mild right pleural effusion is noted with adjacent subsegmental atelectasis. No acute osseous abnormality is noted. Cholelithiasis is noted without inflammation. The liver, spleen and pancreas appear normal. Adrenal glands and kidneys appear normal. No hydronephrosis or renal obstruction is noted. Status post endovascular repair of abdominal aortic aneurysm. The graft and its limbs are patent without evidence of endoleak. Urinary bladder appears normal. Prostatic calcifications are noted. There is no evidence of bowel obstruction. Status post right colectomy. There remains abscess along the right lateral wall inferior to the liver which measures 14.6 by 5.2 x 3.1 cm. No catheter is noted within it. No significant adenopathy is noted. IMPRESSION: Cholelithiasis. Status post endovascular repair of infrarenal abdominal aortic aneurysm. No endoleak is noted. Mild right pleural effusion with adjacent subsegmental atelectasis. Status post right colectomy. 14.6 x 5.2 x 3.1 cm abscess remains along the right lateral chest wall and extending into right pericolic gutter. Electronically Signed   By: JMarijo Conception M.D.   On: 10/06/2015 21:53     Assessment/Plan: 82year old male presenting with seizure activity and hypoglycemia.  No further seizure activity noted since admission.  No history of seizure.  Neurological  examination nonfocal.  Seizure likely provoked.  Anticonvulsant therapy not indicated at this time.    Recommendations: 1.  EEG.  If unremarkable no further neurological intervention recommended.   2.  Agree with continued  addressing of hypoglycemia  Alexis Goodell, MD Neurology [phone] 10/07/2015, 3:30 PM

## 2015-10-07 NOTE — Progress Notes (Signed)
Nurse entered room to preform noon assessment and pt reported feeling tired. Observed being lethargic. CBG resulted at 39. D50 given per protocol. Dr. Manuella Ghazi paged. Will reassess in 15 min. Will continue to assess

## 2015-10-07 NOTE — Progress Notes (Signed)
Dr.Simmonds consulted.  Orders placed to start Glucagon drip.  Waiting on Endocrinology to consult.   Will continue to assess.

## 2015-10-07 NOTE — ED Notes (Signed)
Give Zozyn at [phone]

## 2015-10-07 NOTE — Progress Notes (Signed)
[place] at [place] NAME: Nicholas Parker    MR#:  [id number]  DATE OF BIRTH:  1934/04/02  SUBJECTIVE:  CHIEF COMPLAINT:   Chief Complaint  Patient presents with  . Seizures   sugars staying between 30s to 40s on D10 drip, not very symptomatic.   REVIEW OF SYSTEMS:  Review of Systems  Constitutional: Negative for fever, weight loss, malaise/fatigue and diaphoresis.  HENT: Negative for ear discharge, ear pain, hearing loss, nosebleeds, sore throat and tinnitus.   Eyes: Negative for blurred vision and pain.  Respiratory: Negative for cough, hemoptysis, shortness of breath and wheezing.   Cardiovascular: Negative for chest pain, palpitations, orthopnea and leg swelling.  Gastrointestinal: Negative for heartburn, nausea, vomiting, abdominal pain, diarrhea, constipation and blood in stool.  Genitourinary: Negative for dysuria, urgency and frequency.  Musculoskeletal: Negative for myalgias and back pain.  Skin: Negative for itching and rash.  Neurological: Negative for dizziness, tingling, tremors, focal weakness, seizures, weakness and headaches.  Psychiatric/Behavioral: Negative for depression. The patient is not nervous/anxious.     DRUG ALLERGIES:   Allergies  Allergen Reactions  . Lipitor [Atorvastatin] Other (See Comments)    Reaction:  Leg cramps   . Pravachol [Pravastatin Sodium] Other (See Comments)    Reaction:  Leg cramps  . Riomet [Metformin Hcl] Diarrhea  . Vytorin [Ezetimibe-Simvastatin] Other (See Comments)    Reaction:  Leg cramps   VITALS:  Blood pressure 105/53, pulse 64, temperature 98.5 F (36.9 C), temperature source Oral, resp. rate 22, weight 74.7 kg (164 lb 10.9 oz), SpO2 100 %. PHYSICAL EXAMINATION:  Physical Exam  Constitutional: He is oriented to person, place, and time and well-developed, well-nourished, and in no distress.  HENT:  Head: Normocephalic and atraumatic.  Eyes: Conjunctivae and EOM are  normal. Pupils are equal, round, and reactive to light.  Neck: Normal range of motion. Neck supple. No tracheal deviation present. No thyromegaly present.  Cardiovascular: Normal rate, regular rhythm and normal heart sounds.   Pulmonary/Chest: Effort normal and breath sounds normal. No respiratory distress. He has no wheezes. He exhibits no tenderness.  Abdominal: Soft. Bowel sounds are normal. He exhibits no distension. There is no tenderness.  Musculoskeletal: Normal range of motion.  Neurological: He is alert and oriented to person, place, and time. No cranial nerve deficit.  Skin: Skin is warm and dry. No rash noted.  Psychiatric: Mood and affect normal.   LABORATORY PANEL:   CBC  Recent Labs Lab 10/07/15 0645  WBC 10.8*  HGB 9.8*  HCT 30.0*  PLT 286   ------------------------------------------------------------------------------------------------------------------ Chemistries   Recent Labs Lab 10/06/15 1824 10/07/15 0645 10/07/15 1421  NA 139 138  --   K 3.2* 3.8  --   CL 101 100*  --   CO2 28 33*  --   GLUCOSE 89 73 37*  BUN 14 10  --   CREATININE 1.12 1.10  --   CALCIUM 8.3* 8.2*  --   AST 23  --   --   ALT 12*  --   --   ALKPHOS 57  --   --   BILITOT 0.5  --   --    RADIOLOGY:  Ct Head Wo Contrast  10/07/2015  CLINICAL DATA:  Acute onset of seizure. Tremors. Hypoglycemia. Initial encounter. EXAM: CT HEAD WITHOUT CONTRAST TECHNIQUE: Contiguous axial images were obtained from the base of the skull through the vertex without intravenous contrast. COMPARISON:  None. FINDINGS: There is  no evidence of acute infarction, mass lesion, or intra- or extra-axial hemorrhage on CT. Prominence of the ventricles and sulci reflects mild cortical volume loss. Mild periventricular white matter change likely reflects small vessel ischemic microangiopathy. Mild cerebellar atrophy is noted. The brainstem and fourth ventricle are within normal limits. The basal ganglia are unremarkable  in appearance. The cerebral hemispheres demonstrate grossly normal gray-white differentiation. No mass effect or midline shift is seen. There is no evidence of fracture; visualized osseous structures are unremarkable in appearance. The orbits are within normal limits. The paranasal sinuses and mastoid air cells are well-aerated. No significant soft tissue abnormalities are seen. IMPRESSION: 1. No acute intracranial pathology seen on CT. 2. Mild cortical volume loss and scattered small vessel ischemic microangiopathy. Electronically Signed   By: Garald Balding M.D.   On: 10/07/2015 01:04   Ct Abdomen Pelvis W Contrast  10/06/2015  CLINICAL DATA:  Lower abdominal pain. EXAM: CT ABDOMEN AND PELVIS WITH CONTRAST TECHNIQUE: Multidetector CT imaging of the abdomen and pelvis was performed using the standard protocol following bolus administration of intravenous contrast. CONTRAST:  120m OMNIPAQUE IOHEXOL 300 MG/ML  SOLN COMPARISON:  CT scan of September 29, 2015. FINDINGS: Mild right pleural effusion is noted with adjacent subsegmental atelectasis. No acute osseous abnormality is noted. Cholelithiasis is noted without inflammation. The liver, spleen and pancreas appear normal. Adrenal glands and kidneys appear normal. No hydronephrosis or renal obstruction is noted. Status post endovascular repair of abdominal aortic aneurysm. The graft and its limbs are patent without evidence of endoleak. Urinary bladder appears normal. Prostatic calcifications are noted. There is no evidence of bowel obstruction. Status post right colectomy. There remains abscess along the right lateral wall inferior to the liver which measures 14.6 by 5.2 x 3.1 cm. No catheter is noted within it. No significant adenopathy is noted. IMPRESSION: Cholelithiasis. Status post endovascular repair of infrarenal abdominal aortic aneurysm. No endoleak is noted. Mild right pleural effusion with adjacent subsegmental atelectasis. Status post right colectomy.  14.6 x 5.2 x 3.1 cm abscess remains along the right lateral chest wall and extending into right pericolic gutter. Electronically Signed   By: JMarijo Conception M.D.   On: 10/06/2015 21:53   ASSESSMENT AND PLAN:   * Hypoglycemia - - patient with endocrinology and diabetic nurse coordinator's input along with intensivist - Off sulfonylurea & metformin. Can be restarted upon discharge .  per endocrinology - Continue D10 gtt at 125 mL/hour. Treat per hypoglycemia protocol.  q1 hour glucometer checks until BG stabilizes above 70 mg/dL x 3 consecutive hours. Then reduce to glucometer checks q2-3hours. Started IV hydrocortisone 50 mg every 8 hours.  * Seizure Likely from hypoglycemia. No prior history of seizures. CT scan of the head showed nothing acute. Will place patient on seizure precautions. Ativan when necessary if any further seizures. Pending consult neurology - discussed with Dr. LAlexis Goodell * Intra abdominal abscess s/p colon resection The abscess seems larger as per radiologist description. Pending Consult for surgery Dr. STamala Julian  interventional radiology canceled drainage catheter placement and suffering ongoing severe hypoglycemia - For now, continue broad-spectrum antibiotics with vancomycin and Zosyn. Recent cultures had enterococcus and Escherichia coli.  * CAD Patient had an RCA drug-eluting stent placed on 09/17/2015. He took his Plavix earlier today. I have held her Plavix so that patient can get his IR procedure. Needs to be resumed immediately after the procedure. Resume Plavix if patient is unable to get the procedure.  * HTN Hold medications  due to borderline low blood pressure.  * Chronic systolic chf with EF 22% Well compensated at this time Hold lasix  * DVT prophylaxis with SCDs     All the records are reviewed and case discussed with Care Management/Social Worker. Management plans discussed with the patient, family and they are in agreement.  CODE  STATUS: Full code  TOTAL TIME (critical care) TAKING CARE OF THIS PATIENT: 35 minutes.   More than 50% of the time was spent in counseling/coordination of care: YES  POSSIBLE D/C IN 2-3 DAYS, DEPENDING ON CLINICAL CONDITION.   [facility], Alliya Marcon M.D on 10/07/2015 at 5:50 PM  Between 7am to 6pm - Pager - [phone]  After 6pm go to [email or web address] - password [email or web address]  Office  [phone]  CC: Primary care physician; Adin Hector, MD  Note: This dictation was prepared with Dragon dictation along with smaller phrase technology. Any transcriptional errors that result from this process are unintentional.

## 2015-10-07 NOTE — Progress Notes (Signed)
Inpatient Diabetes Program Recommendations  AACE/ADA: New Consensus Statement on Inpatient Glycemic Control (2015)  Target Ranges:  Prepandial:   less than 140 mg/dL      Peak postprandial:   less than 180 mg/dL (1-2 hours)      Critically ill patients:  140 - 180 mg/dL   Review of Glycemic Control  Results for Nicholas Parker, Nicholas Parker (MRN [id number]) as of 10/07/2015 12:30  Ref. Range 10/07/2015 07:45 10/07/2015 08:14 10/07/2015 08:15 10/07/2015 08:40 10/07/2015 08:54 10/07/2015 10:28 10/07/2015 11:13 10/07/2015 11:56  Glucose-Capillary Latest Ref Range: 65-99 mg/dL 46 (L) 34 (LL) 36 (LL) 60 (L) 86 30 (LL) 36 (LL) 71    Diabetes history: Type 2 Outpatient Diabetes medications: Amaryl '4mg'$  qam, Metformin '500mg'$  bid    Inpatient Diabetes Program Recommendations:  Agree with current orders for D 10% IV at 100 ml per hour, NPO, q1h blood sugar checks, endocrinology consult  and hypoglycemia protocol.   Gentry Fitz, RN, BA, MHA, CDE Diabetes Coordinator Inpatient Diabetes Program  [phone] (Team Pager) [phone] ([place]) 10/07/2015 12:42 PM

## 2015-10-07 NOTE — Progress Notes (Signed)
Pharmacy Antibiotic Note  TRAXTON KOLENDA is a 82 y.o. male admitted on 10/06/2015 with wound infection.  Pharmacy has been consulted for vancomycin dosing.  Plan: Wound cx 2/1 E. coli and E. faecalis (VSE)  TBW 81.6kg  IBW 63.8kg DW 81.6kg  Vd 57L kei 0.048 hr-1  T1/2 14 hours Vancomycin 1250 mg q 18 hours ordered with stacked dosing.  Level before 5th dose.   Weight: 180 lb (81.647 kg)  Temp (24hrs), Avg:97.7 F (36.5 C), Min:97.7 F (36.5 C), Max:97.7 F (36.5 C)   Recent Labs Lab 09/30/15 0528 10/01/15 0556 10/02/15 0555 10/03/15 0500 10/06/15 1824 10/06/15 2032  WBC 8.0 7.8  --  7.8  --  12.6*  CREATININE 0.84 0.90 0.85  --  1.12  --   LATICACIDVEN  --   --   --   --  2.3*  --     Estimated Creatinine Clearance: 52.8 mL/min (by C-G formula based on Cr of 1.12).    Allergies  Allergen Reactions  . Lipitor [Atorvastatin] Other (See Comments)    Reaction:  Leg cramps   . Pravachol [Pravastatin Sodium] Other (See Comments)    Reaction:  Leg cramps  . Riomet [Metformin Hcl] Diarrhea  . Vytorin [Ezetimibe-Simvastatin] Other (See Comments)    Reaction:  Leg cramps    Antimicrobials this admission:   Dose adjustments this admission:   Microbiology results:   Thank you for allowing pharmacy to be a part of this patient's care.  Denzil Mceachron S 10/07/2015 12:52 AM

## 2015-10-07 NOTE — Progress Notes (Addendum)
Dr.Shah paged and and updated to patient's condition. Consults placed to Endocrinology and DM coordinator.  Verbal orders given to increase rate of D10 to 125 and continue current treatment plan. Will continue to assess. Pt CBG was rechecked at 1157 and resulted at 71. Will continue to assess

## 2015-10-07 NOTE — Progress Notes (Signed)
Pt CBG repeated, resulted at 86. Will continue to assess

## 2015-10-07 NOTE — Progress Notes (Signed)
Pt CBG was rechecked and resulted at 36. Dr.Willis paged D50 given per protocol. Will recheck in 15 min.Will continue to assess.

## 2015-10-07 NOTE — Progress Notes (Signed)
Pt CBG rechecked, resulted at 34. Pt given D 50 per protocol. Will recheck in 15 min. Pt now NPO per surgical tem, awaiting drain placement. Will continue to assess.

## 2015-10-07 NOTE — Plan of Care (Signed)
Problem: Nutrition: Goal: Adequate nutrition will be maintained Outcome: Not Progressing Pt with persistent hypoglycemia; poor appetite and poor po intake.  Per endocrinologist, pt may have anything he wants at this time.  Will continue to assess.

## 2015-10-07 NOTE — Progress Notes (Signed)
Patient blood sugar at 0300 was 61 gave D50        per hypoglycemia  Standing orderes.re-checkedCBG 15 mts later was 120. Will continue to observe.

## 2015-10-07 NOTE — Progress Notes (Signed)
[facility] Progress Note Patient Name: ANDREY MCCASKILL DOB: 1934-07-04 MRN: [id number]   Date of Service  10/07/2015  HPI/Events of Note  Admitted for hypoglycemia with seizure with increasing intra abd abscess  eICU Interventions  Cont d10 infusion follow fsbs closely Await gen surgery recs, source control     Intervention Category Evaluation Type: New Patient Evaluation  Sueann Brownley 10/07/2015, 3:03 AM

## 2015-10-07 NOTE — Progress Notes (Signed)
Pt CBG checked and resulted at 30. Glucagon '1mg'$  was given as ordered Will recheck sugar in 15 min. Will continue to assess.

## 2015-10-07 NOTE — Progress Notes (Signed)
Pt CBG resulted at 80. Will continue to assesss

## 2015-10-07 NOTE — Progress Notes (Signed)
Pt sugar was 62 at 1603, verbal orders given by Dr.Shah to wait and check sugar again. CBG was checked again at 1643 with Endocrinology at bed side and was resulted at 68. New orders given by endocrinology to give pt juice or soda to bring sugar up.  Pt was given Pepsi. Will continue to assess.

## 2015-10-07 NOTE — Progress Notes (Signed)
At [place], CBG 46. Pt give 4 mL of juice per protocol. Will recheck in 15 min.  Will continue to assess.

## 2015-10-07 NOTE — Progress Notes (Signed)
1445 verbal orders to hold Glucagon until lab results come back.  1455 Lab called critical glucose value of 37. Dr.Shah notified of lab ordered.  New orders given to continue to hold glucagon drip, administer '50mg'$  Solu-cortef. Wait 15 min and repeat CBG.  Solu- cortef administered at 1545. CBG check again at 1600, resulted at 61.   Dr. Manuella Ghazi notified. New orders given to continue to hold drip. Change in Steroid schedule.  Endocrinology at bedside now.  Will continue to assess.

## 2015-10-08 ENCOUNTER — Encounter: Payer: Self-pay | Admitting: Radiology

## 2015-10-08 ENCOUNTER — Inpatient Hospital Stay: Payer: Commercial Managed Care - HMO

## 2015-10-08 DIAGNOSIS — R569 Unspecified convulsions: Secondary | ICD-10-CM | POA: Insufficient documentation

## 2015-10-08 LAB — CBC
HCT: 31.9 % — ABNORMAL LOW (ref 40.0–52.0)
Hemoglobin: 10.3 g/dL — ABNORMAL LOW (ref 13.0–18.0)
MCH: 27.3 pg (ref 26.0–34.0)
MCHC: 32.3 g/dL (ref 32.0–36.0)
MCV: 84.4 fL (ref 80.0–100.0)
PLATELETS: 265 10*3/uL (ref 150–440)
RBC: 3.77 MIL/uL — ABNORMAL LOW (ref 4.40–5.90)
RDW: 15.6 % — AB (ref 11.5–14.5)
WBC: 8.6 10*3/uL (ref 3.8–10.6)

## 2015-10-08 LAB — GLUCOSE, CAPILLARY
GLUCOSE-CAPILLARY: 146 mg/dL — AB (ref 65–99)
GLUCOSE-CAPILLARY: 155 mg/dL — AB (ref 65–99)
GLUCOSE-CAPILLARY: 192 mg/dL — AB (ref 65–99)
GLUCOSE-CAPILLARY: 205 mg/dL — AB (ref 65–99)
GLUCOSE-CAPILLARY: 175 mg/dL — AB (ref 65–99)

## 2015-10-08 LAB — BASIC METABOLIC PANEL
ANION GAP: 15 (ref 5–15)
BUN: 8 mg/dL (ref 6–20)
CHLORIDE: 101 mmol/L (ref 101–111)
CO2: 22 mmol/L (ref 22–32)
Calcium: 8.3 mg/dL — ABNORMAL LOW (ref 8.9–10.3)
Creatinine, Ser: 1.07 mg/dL (ref 0.61–1.24)
GFR calc Af Amer: >60 mL/min (ref >60)
GLUCOSE: 173 mg/dL — AB (ref 65–99)
POTASSIUM: 3.9 mmol/L (ref 3.5–5.1)
SODIUM: 138 mmol/L (ref 135–145)

## 2015-10-08 MED ORDER — HYDROCORTISONE NA SUCCINATE PF 100 MG IJ SOLR: 25.0000 mg | Freq: Three times a day (TID) | INTRAMUSCULAR | Status: DC

## 2015-10-08 MED ORDER — FENTANYL CITRATE (PF) 100 MCG/2ML IJ SOLN
INTRAMUSCULAR | Status: AC
  Filled 2015-10-08: qty 2

## 2015-10-08 MED ORDER — AMPICILLIN 500 MG PO CAPS: 500.0000 mg | ORAL_CAPSULE | Freq: Four times a day (QID) | ORAL | Status: DC

## 2015-10-08 MED ORDER — MIDAZOLAM HCL 5 MG/5ML IJ SOLN
INTRAMUSCULAR | Status: AC
  Filled 2015-10-08: qty 5

## 2015-10-08 NOTE — Progress Notes (Signed)
Pt returned from [place] in stable condition. Report received from [place].  Dr.Shah notified of pt return and condition. Verbal orders given to transfer to 2A.  [employer] representative with family now.  Attempted to call report to receiving RN.

## 2015-10-08 NOTE — Care Management (Signed)
Patient adamantly  but very pleasantly again declines home health nurse follow up.  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

## 2015-10-08 NOTE — Progress Notes (Signed)
Patient life vest alerting of low battery.. Life vest taken off and patient placed on [employer] defibrillator pads per Dr. Marcille Blanco. Life vest placed in personal belongings bag. Patient states his wife will bring extra battery in tomorrow. Customer Service for [employer] vest called and one of their reps is to bring in extra battery to replace if patient's wife does not bring his from home first. Patient has no signs of distress or complaints at this time; will continue to monitor.

## 2015-10-08 NOTE — Progress Notes (Signed)
Pt transported to Interventional Radiology for drain placement. Pt in stable condition.

## 2015-10-08 NOTE — Procedures (Signed)
Ct guided abscess drainage with 99V catheter  Complications:  None  Blood Loss: none  See dictation in canopy pacs  Catheter must be reevaluated with CT prior to tube removal!

## 2015-10-08 NOTE — Procedures (Signed)
ELECTROENCEPHALOGRAM REPORT   Patient: Nicholas Parker       Room #: IC01A-AA EEG No. ID: [id number] Age: 82 y.o.        Sex: male Referring Physician: Manuella Ghazi Report Date:  10/08/2015        Interpreting Physician: Alexis Goodell  History: [name] is an 82 y.o. male with witnessed seizure at home  Medications:  Scheduled: . amiodarone  400 mg Oral Daily  . docusate sodium  100 mg Oral BID  . furosemide  20 mg Oral BID  . glucagon (GLUCAGEN)  IVPB (bolus for beta blocker/calcium channel bocker overdose)  5 mg Intravenous Once  . hydrocortisone sod succinate (SOLU-CORTEF) inj  25 mg Intravenous Q8H  . loratadine  10 mg Oral Daily  . metoprolol succinate  25 mg Oral Daily  . pantoprazole  40 mg Oral Daily  . piperacillin-tazobactam (ZOSYN)  IV  3.375 g Intravenous 3 times per day  . simvastatin  40 mg Oral QHS  . sodium chloride flush  3 mL Intravenous Q12H  . sodium chloride flush  3 mL Intravenous Q12H  . vancomycin  1,250 mg Intravenous Q18H    Conditions of Recording:  This is a 16 channel EEG carried out with the patient in the awake and drowsy states.  Description:  The waking background activity consists of a low voltage, symmetrical, fairly well organized, 8 Hz alpha activity, seen from the parieto-occipital and posterior temporal regions.  Low voltage fast activity, poorly organized, is seen anteriorly and is at times superimposed on more posterior regions.  A mixture of theta and alpha rhythms are seen from the central and temporal regions. The patient drowses with slowing to irregular, low voltage theta and beta activity.   Stage II sleep is not obtained. No epileptiform activity is noted.   Hyperventilation and intermittent photic stimulation were not performed.   IMPRESSION: This is a normal electroencephalogram. There are no focal lateralizing or epileptiform features noted.   Alexis Goodell, MD Neurology [phone] 10/08/2015, 12:59 PM

## 2015-10-08 NOTE — Progress Notes (Signed)
MEDICATION RELATED CONSULT NOTE - INITIAL   Pharmacy Consult for Post IR Procedure Anticoagulant/Antiplatelet   Allergies  Allergen Reactions  . Lipitor [Atorvastatin] Other (See Comments)    Reaction:  Leg cramps   . Pravachol [Pravastatin Sodium] Other (See Comments)    Reaction:  Leg cramps  . Riomet [Metformin Hcl] Diarrhea  . Vytorin [Ezetimibe-Simvastatin] Other (See Comments)    Reaction:  Leg cramps    Patient Measurements: Height: '5\' 6"'$  (167.6 cm) Weight: 166 lb 10.7 oz (75.6 kg) IBW/kg (Calculated) : 63.8   Vital Signs: Temp: 97.5 F (36.4 C) (02/10 1306) Temp Source: Oral (02/10 1306) BP: 111/56 mmHg (02/10 1306) Pulse Rate: 67 (02/10 1306) Intake/Output from previous day: 02/09 0701 - 02/10 0700 In: 2632.7 [I.V.:2482.7; IV Piggyback:150] Out: 2575 [Urine:2575] Intake/Output from this shift: Total I/O In: 6 [I.V.:6] Out: -   Labs:  Recent Labs  10/06/15 1824 10/06/15 2032 10/07/15 0645 10/08/15 0327  WBC  --  12.6* 10.8* 8.6  HGB  --  9.2* 9.8* 10.3*  HCT  --  31.4* 30.0* 31.9*  PLT  --  273 286 265  CREATININE 1.12  --  1.10 1.07  ALBUMIN 2.5*  --   --   --   PROT 5.5*  --   --   --   AST 23  --   --   --   ALT 12*  --   --   --   ALKPHOS 57  --   --   --   BILITOT 0.5  --   --   --    Estimated Creatinine Clearance: 49.7 mL/min (by C-G formula based on Cr of 1.07).   Microbiology: Recent Results (from the past 720 hour(s))  CULTURE, BLOOD (ROUTINE X 2) w Reflex to PCR ID Panel     Status: None   Collection Time: 09/26/15 11:34 AM  Result Value Ref Range Status   Specimen Description BLOOD LEFT HAND  Final   Special Requests   Final    BOTTLES DRAWN AEROBIC AND ANAEROBIC  AER 1CC ANA 3CC   Culture NO GROWTH 5 DAYS  Final   Report Status 10/01/2015 FINAL  Final  CULTURE, BLOOD (ROUTINE X 2) w Reflex to PCR ID Panel     Status: None   Collection Time: 09/26/15 11:35 AM  Result Value Ref Range Status   Specimen Description BLOOD LEFT  ARM  Final   Special Requests BOTTLES DRAWN AEROBIC AND ANAEROBIC  10CC  Final   Culture NO GROWTH 5 DAYS  Final   Report Status 10/01/2015 FINAL  Final  MRSA PCR Screening     Status: None   Collection Time: 09/27/15  9:15 AM  Result Value Ref Range Status   MRSA by PCR NEGATIVE NEGATIVE Final    Comment:        The GeneXpert MRSA Assay (FDA approved for NASAL specimens only), is one component of a comprehensive MRSA colonization surveillance program. It is not intended to diagnose MRSA infection nor to guide or monitor treatment for MRSA infections.   Culture, routine-abscess     Status: None   Collection Time: 09/29/15  5:11 PM  Result Value Ref Range Status   Specimen Description BIOPSY  Final   Special Requests Normal  Final   Culture   Final    MODERATE GROWTH ESCHERICHIA COLI MODERATE GROWTH ENTEROCOCCUS FAECALIS    Report Status 10/04/2015 FINAL  Final   Organism ID, Bacteria ESCHERICHIA COLI  Final  Organism ID, Bacteria ENTEROCOCCUS FAECALIS  Final      Susceptibility   Escherichia coli - MIC*    AMPICILLIN Value in next row Sensitive      SENSITIVE<=2    CEFAZOLIN Value in next row Sensitive      SENSITIVE<=4    CEFEPIME Value in next row Sensitive      SENSITIVE<=1    CEFTAZIDIME Value in next row Sensitive      SENSITIVE<=1    CEFTRIAXONE Value in next row Sensitive      SENSITIVE<=1    CIPROFLOXACIN Value in next row Sensitive      SENSITIVE<=0.25    GENTAMICIN Value in next row Sensitive      SENSITIVE<=1    IMIPENEM Value in next row Sensitive      SENSITIVE<=0.25    TRIMETH/SULFA Value in next row Sensitive      SENSITIVE<=20    AMPICILLIN/SULBACTAM Value in next row Sensitive      SENSITIVE<=2    PIP/TAZO Value in next row Sensitive      SENSITIVE<=4    Extended ESBL Value in next row Sensitive      SENSITIVE<=4    * MODERATE GROWTH ESCHERICHIA COLI   Enterococcus faecalis - MIC*    AMPICILLIN Value in next row Sensitive      SENSITIVE<=2     VANCOMYCIN Value in next row Sensitive      SENSITIVE1    GENTAMICIN SYNERGY Value in next row Sensitive      SENSITIVE1    LINEZOLID Value in next row Sensitive      SENSITIVE2    * MODERATE GROWTH ENTEROCOCCUS FAECALIS  MRSA PCR Screening     Status: None   Collection Time: 10/07/15  2:18 AM  Result Value Ref Range Status   MRSA by PCR NEGATIVE NEGATIVE Final    Comment:        The GeneXpert MRSA Assay (FDA approved for NASAL specimens only), is one component of a comprehensive MRSA colonization surveillance program. It is not intended to diagnose MRSA infection nor to guide or monitor treatment for MRSA infections.     Medical History: Past Medical History  Diagnosis Date  . Hypertension   . Diabetes mellitus without complication ([place])   . Hyperlipidemia   . Coronary artery disease   . neuropathy   . Low testosterone   . Neuropathy ([place])   . Anemia   . B12 deficiency   . GERD (gastroesophageal reflux disease)   . Skin cancer     Basal Cell  . Adenocarcinoma ([place])   . AAA (abdominal aortic aneurysm) ([place])   . Diverticulosis     Medications:  Scheduled:  . amiodarone  400 mg Oral Daily  . docusate sodium  100 mg Oral BID  . furosemide  20 mg Oral BID  . glucagon (GLUCAGEN)  IVPB (bolus for beta blocker/calcium channel bocker overdose)  5 mg Intravenous Once  . hydrocortisone sod succinate (SOLU-CORTEF) inj  25 mg Intravenous Q8H  . loratadine  10 mg Oral Daily  . metoprolol succinate  25 mg Oral Daily  . pantoprazole  40 mg Oral Daily  . piperacillin-tazobactam (ZOSYN)  IV  3.375 g Intravenous 3 times per day  . simvastatin  40 mg Oral QHS  . sodium chloride flush  3 mL Intravenous Q12H  . sodium chloride flush  3 mL Intravenous Q12H  . vancomycin  1,250 mg Intravenous Q18H   Infusions:  . glucagon (GLUCAGEN) infusion (for  beta blocker/calcium channel blocker overdose) Stopped (10/07/15 1430)    Assessment: 82 y/o M s/p abscess drainage per IR  previously on clopidogrel.   Plan:  If patient is to remain inpatient, would recommend resuming clopidogrel 24 hours post-procedure and beginning LMWH at the same time. Patient has dishcarge orders at present. Recommendations communicated to MD. Will sign off.   Ulice Dash D 10/08/2015,3:22 PM

## 2015-10-08 NOTE — Progress Notes (Signed)
Inpatient Diabetes Program Recommendations  AACE/ADA: New Consensus Statement on Inpatient Glycemic Control (2015)  Target Ranges:  Prepandial:   less than 140 mg/dL      Peak postprandial:   less than 180 mg/dL (1-2 hours)      Critically ill patients:  140 - 180 mg/dL   Review of Glycemic Control  Results for Nicholas Parker, SPEIR (MRN [id number]) as of 10/08/2015 07:10  Ref. Range 10/07/2015 23:08 10/07/2015 23:59 10/08/2015 01:02 10/08/2015 03:43 10/08/2015 06:39  Glucose-Capillary Latest Ref Range: 65-99 mg/dL 131 (H) 140 (H) 146 (H) 155 (H) 192 (H)   Diabetes history: Type 2 Outpatient Diabetes medications: Amaryl '4mg'$  qam, Metformin '500mg'$  bid   Current orders for Inpatient glycemic control: none  Inpatient Diabetes Program Recommendations:  Endocrinology following this patient.  q3h checks through the night- BG more stable now- regular diet initiated. Will likely require low dose correction Novolog insulin - Dr. Gabriel Carina to make recommendations later today.  Gentry Fitz, RN, BA, MHA, CDE Diabetes Coordinator Inpatient Diabetes Program  [phone] (Team Pager) [phone] ([place]) 10/08/2015 7:14 AM

## 2015-10-08 NOTE — Progress Notes (Signed)
Pharmacy Antibiotic Note  Nicholas Parker is a 82 y.o. male admitted on 10/06/2015 with wound infection.  Pharmacy has been consulted for vancomycin dosing. Patient currently on vancomycin and Zosyn.   Plan: Wound cx 2/1 E. coli and E. faecalis (VSE)  Will continue vancomycin 1250 mg q 18 hours.  Level before 5th dose.   Height: '5\' 6"'$  (167.6 cm) Weight: 166 lb 10.7 oz (75.6 kg) IBW/kg (Calculated) : 63.8  Temp (24hrs), Avg:97.7 F (36.5 C), Min:96.2 F (35.7 C), Max:98.5 F (36.9 C)   Recent Labs Lab 10/02/15 0555 10/03/15 0500 10/06/15 0645 10/06/15 1824 10/06/15 2032 10/07/15 0645 10/08/15 0327  WBC  --  7.8  --   --  12.6* 10.8* 8.6  CREATININE 0.85  --   --  1.12  --  1.10 1.07  LATICACIDVEN  --   --  2.2* 2.3*  --   --   --     Estimated Creatinine Clearance: 49.7 mL/min (by C-G formula based on Cr of 1.07).    Allergies  Allergen Reactions  . Lipitor [Atorvastatin] Other (See Comments)    Reaction:  Leg cramps   . Pravachol [Pravastatin Sodium] Other (See Comments)    Reaction:  Leg cramps  . Riomet [Metformin Hcl] Diarrhea  . Vytorin [Ezetimibe-Simvastatin] Other (See Comments)    Reaction:  Leg cramps    Antimicrobials this admission: Vancomycin 2/08 >> Zosyn 2/08 >>  Dose adjustments this admission:   Microbiology results: MRSA PCR negative Wound cx 2/1 E. coli and E. faecalis (VSE)   Thank you for allowing pharmacy to be a part of this patient's care.  Ulice Dash D 10/08/2015 11:19 AM

## 2015-10-08 NOTE — Discharge Instructions (Signed)

## 2015-10-08 NOTE — Progress Notes (Signed)
Subjective: No further seizures noted.  Objective: Current vital signs: BP 106/53 mmHg  Pulse 68  Temp(Src) 97.7 F (36.5 C) (Oral)  Resp 27  Ht '5\' 6"'$  (1.676 m)  Wt 75.6 kg (166 lb 10.7 oz)  BMI 26.91 kg/m2  SpO2 96% Vital signs in last 24 hours: Temp:  [96.2 F (35.7 C)-98.5 F (36.9 C)] 97.7 F (36.5 C) (02/10 0800) Pulse Rate:  [57-74] 68 (02/10 1200) Resp:  [16-31] 27 (02/10 1200) BP: (92-114)/(43-61) 106/53 mmHg (02/10 1200) SpO2:  [94 %-100 %] 96 % (02/10 1200) Weight:  [75.6 kg (166 lb 10.7 oz)] 75.6 kg (166 lb 10.7 oz) (02/10 0545)  Intake/Output from previous day: 02/09 0701 - 02/10 0700 In: 2632.7 [I.V.:2482.7; IV Piggyback:150] Out: 2575 [Urine:2575] Intake/Output this shift: Total I/O In: 6 [I.V.:6] Out: -  Nutritional status: Diet regular Room service appropriate?: Yes; Fluid consistency:: Thin  Neurologic Exam: Mental Status: Alert, oriented, thought content appropriate. Speech fluent without evidence of aphasia. Able to follow 3 step commands without difficulty. Cranial Nerves: II: Discs flat bilaterally; Visual fields grossly normal, pupils equal, round, reactive to light and accommodation III,IV, VI: left ptosis, extra-ocular motions intact bilaterally V,VII: smile symmetric, facial light touch sensation normal bilaterally VIII: hearing normal bilaterally IX,X: gag reflex present XI: bilateral shoulder shrug XII: midline tongue extension Motor: 5/5 throughout  Lab Results: Basic Metabolic Panel:  Recent Labs Lab 10/02/15 0555 10/06/15 1824 10/07/15 0645 10/07/15 1421 10/08/15 0327  NA 140 139 138  --  138  K 3.9 3.2* 3.8  --  3.9  CL 99* 101 100*  --  101  CO2 34* 28 33*  --  22  GLUCOSE 112* 89 73 37* 173*  BUN '12 14 10  '$ --  8  CREATININE 0.85 1.12 1.10  --  1.07  CALCIUM 8.3* 8.3* 8.2*  --  8.3*    Liver Function Tests:  Recent Labs Lab 10/06/15 1824  AST 23  ALT 12*  ALKPHOS 57  BILITOT 0.5  PROT 5.5*  ALBUMIN 2.5*    No results for input(s): LIPASE, AMYLASE in the last 168 hours. No results for input(s): AMMONIA in the last 168 hours.  CBC:  Recent Labs Lab 10/03/15 0500 10/06/15 2032 10/07/15 0645 10/08/15 0327  WBC 7.8 12.6* 10.8* 8.6  HGB 9.4* 9.2* 9.8* 10.3*  HCT 29.2* 31.4* 30.0* 31.9*  MCV 84.9 93.2 83.3 84.4  PLT 302 273 286 265    Cardiac Enzymes:  Recent Labs Lab 10/06/15 1824  TROPONINI 0.33*    Lipid Panel: No results for input(s): CHOL, TRIG, HDL, CHOLHDL, VLDL, LDLCALC in the last 168 hours.  CBG:  Recent Labs Lab 10/08/15 0102 10/08/15 0343 10/08/15 0639 10/08/15 1107 10/08/15 1206  GLUCAP 146* 155* 192* 205* 175*    Microbiology: Results for orders placed or performed during the hospital encounter of 10/06/15  MRSA PCR Screening     Status: None   Collection Time: 10/07/15  2:18 AM  Result Value Ref Range Status   MRSA by PCR NEGATIVE NEGATIVE Final    Comment:        The GeneXpert MRSA Assay (FDA approved for NASAL specimens only), is one component of a comprehensive MRSA colonization surveillance program. It is not intended to diagnose MRSA infection nor to guide or monitor treatment for MRSA infections.     Coagulation Studies: No results for input(s): LABPROT, INR in the last 72 hours.  Imaging: Ct Head Wo Contrast  10/07/2015  CLINICAL DATA:  Acute onset of seizure. Tremors. Hypoglycemia. Initial encounter. EXAM: CT HEAD WITHOUT CONTRAST TECHNIQUE: Contiguous axial images were obtained from the base of the skull through the vertex without intravenous contrast. COMPARISON:  None. FINDINGS: There is no evidence of acute infarction, mass lesion, or intra- or extra-axial hemorrhage on CT. Prominence of the ventricles and sulci reflects mild cortical volume loss. Mild periventricular white matter change likely reflects small vessel ischemic microangiopathy. Mild cerebellar atrophy is noted. The brainstem and fourth ventricle are within normal  limits. The basal ganglia are unremarkable in appearance. The cerebral hemispheres demonstrate grossly normal gray-white differentiation. No mass effect or midline shift is seen. There is no evidence of fracture; visualized osseous structures are unremarkable in appearance. The orbits are within normal limits. The paranasal sinuses and mastoid air cells are well-aerated. No significant soft tissue abnormalities are seen. IMPRESSION: 1. No acute intracranial pathology seen on CT. 2. Mild cortical volume loss and scattered small vessel ischemic microangiopathy. Electronically Signed   By: Garald Balding M.D.   On: 10/07/2015 01:04   Ct Abdomen Pelvis W Contrast  10/06/2015  CLINICAL DATA:  Lower abdominal pain. EXAM: CT ABDOMEN AND PELVIS WITH CONTRAST TECHNIQUE: Multidetector CT imaging of the abdomen and pelvis was performed using the standard protocol following bolus administration of intravenous contrast. CONTRAST:  139m OMNIPAQUE IOHEXOL 300 MG/ML  SOLN COMPARISON:  CT scan of September 29, 2015. FINDINGS: Mild right pleural effusion is noted with adjacent subsegmental atelectasis. No acute osseous abnormality is noted. Cholelithiasis is noted without inflammation. The liver, spleen and pancreas appear normal. Adrenal glands and kidneys appear normal. No hydronephrosis or renal obstruction is noted. Status post endovascular repair of abdominal aortic aneurysm. The graft and its limbs are patent without evidence of endoleak. Urinary bladder appears normal. Prostatic calcifications are noted. There is no evidence of bowel obstruction. Status post right colectomy. There remains abscess along the right lateral wall inferior to the liver which measures 14.6 by 5.2 x 3.1 cm. No catheter is noted within it. No significant adenopathy is noted. IMPRESSION: Cholelithiasis. Status post endovascular repair of infrarenal abdominal aortic aneurysm. No endoleak is noted. Mild right pleural effusion with adjacent subsegmental  atelectasis. Status post right colectomy. 14.6 x 5.2 x 3.1 cm abscess remains along the right lateral chest wall and extending into right pericolic gutter. Electronically Signed   By: JMarijo Conception M.D.   On: 10/06/2015 21:53   Ct Image Guided Drainage By Percutaneous Catheter  10/08/2015  INDICATION: Persistent right mid abdominal abscess despite previous drainage EXAM: CT GUIDED DRAINAGE OF POSTOPERATIVE RIGHT ABDOMINAL ABSCESS MEDICATIONS: The patient is currently admitted to the hospital and receiving intravenous antibiotics. The antibiotics were administered within an appropriate time frame prior to the initiation of the procedure. ANESTHESIA/SEDATION: None COMPLICATIONS: None immediate. TECHNIQUE: Informed written consent was obtained from the patient after a thorough discussion of the procedural risks, benefits and alternatives. All questions were addressed. Maximal Sterile Barrier Technique was utilized including caps, mask, sterile gowns, sterile gloves, sterile drape, hand hygiene and skin antiseptic. A timeout was performed prior to the initiation of the procedure. PROCEDURE: The operative field was prepped with chlorhexidine in a sterile fashion, and a sterile drape was applied covering the operative field. A sterile gown and sterile gloves were used for the procedure. Local anesthesia was provided with 1% Lidocaine. Utilizing 1% lidocaine as a local anesthetic and CT fluoroscopic guidance, an 18 gauge trocar needle was placed into the bilobed residual abscess cavity in the  right mid abdomen. The more anterior approach was performed to allow of a catheter courses through both lobes of the collection. An Amplatz wire was then coiled within the inferior aspect of the collection and dilatation new 10 [place] was performed. A 12 French drainage catheter was then placed and fluid was aspirated which was overtly purulent. Samples were sent for laboratory evaluation. The catheter was then sewn into place  utilizing 0 Prolene. The patient tolerated the procedure well. The catheter was then placed to an external suction grenade. The patient was then returned to his room in satisfactory condition. FINDINGS: Bilobed abscess cavity similar to that noted on the prior exam of 10/06/2015. The overall appearance is reduced in size from the initial exam obtained on 09/29/2015. IMPRESSION: Successful placement of a 12 French drainage catheter as described. Samples were sent to the lab for evaluation. REPEAT CT EXAMINATION IS MANDATORY PRIOR TO CATHETER REMOVAL TO AVOID PERSISTENT/RECURRENT ABSCESS FORMATION. This was discussed with the patient's family at the time of exam performance. Electronically Signed   By: Inez Catalina M.D.   On: 10/08/2015 12:11    Medications:  I have reviewed the patient's current medications. Scheduled: . amiodarone  400 mg Oral Daily  . docusate sodium  100 mg Oral BID  . furosemide  20 mg Oral BID  . glucagon (GLUCAGEN)  IVPB (bolus for beta blocker/calcium channel bocker overdose)  5 mg Intravenous Once  . hydrocortisone sod succinate (SOLU-CORTEF) inj  25 mg Intravenous Q8H  . loratadine  10 mg Oral Daily  . metoprolol succinate  25 mg Oral Daily  . pantoprazole  40 mg Oral Daily  . piperacillin-tazobactam (ZOSYN)  IV  3.375 g Intravenous 3 times per day  . simvastatin  40 mg Oral QHS  . sodium chloride flush  3 mL Intravenous Q12H  . sodium chloride flush  3 mL Intravenous Q12H  . vancomycin  1,250 mg Intravenous Q18H    Assessment/Plan: Patient without further seizures.  EEG normal.  Anticonvulsant therapy not indicated at this time.  Seizure provoked.    Recommendations: 1.  No further neurologic intervention is recommended at this time.  If further questions arise, please call or page at that time.  Thank you for allowing neurology to participate in the care of this patient.     LOS: 1 day   Alexis Goodell, MD Neurology [phone] 10/08/2015  1:03  PM

## 2015-10-08 NOTE — Progress Notes (Signed)
This 82 year old male recently had laparoscopy which was converted to an open right colectomy with extensive enterolysis.  He had pathologic findings of tubular adenoma.  His postoperative course was complicated by a myocardial infarction and also developed a right intra-abdominal abscess.  The abscess was drained with percutaneous CT-guided catheter.  Culture demonstrated E. coli and enterococcus both sensitive to ampicillin.  After drainage decreased to 20 cc per 24 hours the catheter was removed and he was discharged on oral ampicillin.  He was readmitted with hypoglycemia and seizures.  He had a follow-up CT scan which I have reviewed the images which demonstrated fluid collection in the right mid abdomen.  He reports minimal abdominal discomfort.  He has been eating satisfactorily and moving his bowels satisfactorily.  On examination he was afebrile.  He was awake alert and oriented and abdomen is nondistended soft and nontender.  White blood count on 10/07/2015 was 10,800, today is 8600  He had repeat insertion of CT-guided catheter today.  Draining purulent fluid.  Fluid has been submitted to the lab for culture.  I discussed this case with Dr.Shah who reports he is now ready for discharge  Impression right mid abdominal abscess  Plan is to discharge on oral ampicillin empty drain as needed and record.  Anticipate a follow-up CT scan approximately next week to confirm resolution of abscess

## 2015-10-08 NOTE — Progress Notes (Signed)
A & O. No pain. Room air. NSR. Life vest on. Wife at the bedside. Discharge instuctions given to pt. Pt has no further concerns at this time.

## 2015-10-08 NOTE — Consult Note (Signed)
   [facility] CM Inpatient Consult   10/08/2015  Nicholas Parker 1934-03-21 [id number]   Patient was discharged prior to [place] referral. Will follow up for potential [facility] Management services.  Marthenia Rolling, MSN-Ed, RN,BSN [facility] Liaison [phone]

## 2015-10-10 DIAGNOSIS — Z955 Presence of coronary angioplasty implant and graft: Secondary | ICD-10-CM | POA: Insufficient documentation

## 2015-10-10 DIAGNOSIS — D126 Benign neoplasm of colon, unspecified: Secondary | ICD-10-CM | POA: Insufficient documentation

## 2015-10-10 DIAGNOSIS — Z87898 Personal history of other specified conditions: Secondary | ICD-10-CM | POA: Insufficient documentation

## 2015-10-11 ENCOUNTER — Other Ambulatory Visit: Payer: Self-pay | Admitting: Surgery

## 2015-10-11 DIAGNOSIS — K659 Peritonitis, unspecified: Secondary | ICD-10-CM

## 2015-10-11 DIAGNOSIS — I2119 ST elevation (STEMI) myocardial infarction involving other coronary artery of inferior wall: Secondary | ICD-10-CM | POA: Diagnosis not present

## 2015-10-11 DIAGNOSIS — I2510 Atherosclerotic heart disease of native coronary artery without angina pectoris: Secondary | ICD-10-CM | POA: Diagnosis not present

## 2015-10-11 DIAGNOSIS — I714 Abdominal aortic aneurysm, without rupture: Secondary | ICD-10-CM | POA: Diagnosis not present

## 2015-10-11 DIAGNOSIS — D518 Other vitamin B12 deficiency anemias: Secondary | ICD-10-CM | POA: Diagnosis not present

## 2015-10-11 DIAGNOSIS — G629 Polyneuropathy, unspecified: Secondary | ICD-10-CM | POA: Diagnosis not present

## 2015-10-11 DIAGNOSIS — E118 Type 2 diabetes mellitus with unspecified complications: Secondary | ICD-10-CM | POA: Diagnosis not present

## 2015-10-11 DIAGNOSIS — D6489 Other specified anemias: Secondary | ICD-10-CM | POA: Diagnosis not present

## 2015-10-11 DIAGNOSIS — E782 Mixed hyperlipidemia: Secondary | ICD-10-CM | POA: Diagnosis not present

## 2015-10-11 DIAGNOSIS — I10 Essential (primary) hypertension: Secondary | ICD-10-CM | POA: Diagnosis not present

## 2015-10-11 DIAGNOSIS — IMO0002 Reserved for concepts with insufficient information to code with codable children: Secondary | ICD-10-CM | POA: Insufficient documentation

## 2015-10-11 DIAGNOSIS — K219 Gastro-esophageal reflux disease without esophagitis: Secondary | ICD-10-CM | POA: Diagnosis not present

## 2015-10-11 NOTE — Discharge Summary (Signed)
Nicholas Parker NAME: Nicholas Parker    MR#:  [id number]  DATE OF BIRTH:  23-Jun-1934  DATE OF ADMISSION:  10/06/2015 ADMITTING PHYSICIAN: Hillary Bow, MD  DATE OF DISCHARGE: 10/08/2015  3:43 PM  PRIMARY CARE PHYSICIAN: Tama High III, MD    ADMISSION DIAGNOSIS:  Abscess [L02.91] Seizure ([place]) [R56.9] Hypoglycemia [E16.2] Abdominal abscess ([place]) [K65.1]  DISCHARGE DIAGNOSIS:  Active Problems:   Hypoglycemia   Seizure ([place]) Intraabdominal abscess requiring CT guided cathetar placement on 2/10  SECONDARY DIAGNOSIS:   Past Medical History  Diagnosis Date  . Hypertension   . Diabetes mellitus without complication ([place])   . Hyperlipidemia   . Coronary artery disease   . neuropathy   . Low testosterone   . Neuropathy ([place])   . Anemia   . B12 deficiency   . GERD (gastroesophageal reflux disease)   . Skin cancer     Basal Cell  . Adenocarcinoma ([place])   . AAA (abdominal aortic aneurysm) ([place])   . Diverticulosis     HOSPITAL COURSE:  82 y.o. male with a known history of hypertension, diabetes, CAD, colon resection recently in the emergency room due to an episode of seizure. He was recently in the hospital for intra-abdominal abscess and was discharged on ampicillin after cultures grew enterococcus and Escherichia coli. Patient had CT-guided catheter placement with drainage which was removed prior to discharge. This time He was noted to have sugars as low as 12. In spite of multiple rounds of D50 blood sugars are still low at 43. White count elevated at 12.6. CT scan of the abdomen repeated in the emergency room shows the intra-abdominal right-sided abscess to be larger and complained of some right-sided abdominal pain at this time. He also reported decreased appetite at home. Please see Dr Boykin Reaper dictated [place] for further details.  He was persistently hypoglycemic despite D10 drip so was managed in ICU. Endocrine c/s was  obtained who recommended starting solucortef considering possible underlying infection/abscess requiring stress dose steroids. He responded to that very well and sugars started coming up. There was also possible concern for Sulfonylurea contributing to his persistent hypoglycemia and amaryl was stopped while in the Hospital and at D/C.  Patient also had some seizure like episode which was thought to be due to severe hypoglycemia for which Neuro c/s was obtained who recommended EEG which was WNL. No anticonvulsant treatment was recommended per Neuro.  He underwent CT guided cathetar placement with Draining purulent fluid on 2/10 and was doing much better. After discussion with Dr Tamala Julian he was d/c home as he was feeling quite back to baseline. His leukocytosis was resolved and sugars were fine also without D10.   He was D/C home in stable condition. He and his wife were in agreement with D/C plans. They were instructed to call Dr Thompson Caul office on coming Wednesday with fluid collection from cathetar (wife was instructed to keep log of output from cathetar) and depending on that Dr Tamala Julian will decide whether to remove cathetar or not (may need another CT before removing cathetar). Family was in agreement with same. DISCHARGE CONDITIONS:   stable  CONSULTS OBTAINED:  Treatment Team:  Catarina Hartshorn, MD Abby Percell Locus, MD  DRUG ALLERGIES:   Allergies  Allergen Reactions  . Lipitor [Atorvastatin] Other (See Comments)    Reaction:  Leg cramps   . Pravachol [Pravastatin Sodium] Other (See Comments)    Reaction:  Leg  cramps  . Riomet [Metformin Hcl] Diarrhea  . Vytorin [Ezetimibe-Simvastatin] Other (See Comments)    Reaction:  Leg cramps    DISCHARGE MEDICATIONS:   Discharge Medication List as of 10/08/2015  2:40 PM    CONTINUE these medications which have CHANGED   Details  ampicillin (PRINCIPEN) 500 MG capsule Take 1 capsule (500 mg total) by mouth 4 (four) times daily., Starting  10/08/2015, Until Discontinued, Normal      CONTINUE these medications which have NOT CHANGED   Details  amiodarone (PACERONE) 400 MG tablet Take 1 tablet (400 mg total) by mouth daily., Starting 10/04/2015, Until Discontinued, Normal    aspirin EC 81 MG tablet Take 81 mg by mouth daily., Until Discontinued, Historical Med    clopidogrel (PLAVIX) 75 MG tablet Take 1 tablet (75 mg total) by mouth daily with breakfast., Starting 09/21/2015, Until Discontinued, Print    furosemide (LASIX) 20 MG tablet Take 1 tablet (20 mg total) by mouth 2 (two) times daily., Starting 10/04/2015, Until Discontinued, Normal    lisinopril (PRINIVIL,ZESTRIL) 5 MG tablet Take 5 mg by mouth daily., Until Discontinued, Historical Med    loratadine (CLARITIN) 10 MG tablet Take 10 mg by mouth daily., Until Discontinued, Historical Med    metFORMIN (GLUCOPHAGE) 500 MG tablet Take 500 mg by mouth 2 (two) times daily with a meal. , Until Discontinued, Historical Med    metoprolol succinate (TOPROL-XL) 25 MG 24 hr tablet Take 25 mg by mouth daily., Until Discontinued, Historical Med    oxyCODONE-acetaminophen (PERCOCET/ROXICET) 5-325 MG tablet Take 1 tablet by mouth every 4 (four) hours as needed for severe pain., Until Discontinued, Historical Med    pantoprazole (PROTONIX) 40 MG tablet Take 40 mg by mouth daily. , Until Discontinued, Historical Med    simvastatin (ZOCOR) 40 MG tablet Take 40 mg by mouth at bedtime. , Until Discontinued, Historical Med    vitamin B-12 (CYANOCOBALAMIN) 500 MCG tablet Take 500 mcg by mouth daily., Until Discontinued, Historical Med      STOP taking these medications     glimepiride (AMARYL) 4 MG tablet          DISCHARGE INSTRUCTIONS:    DIET:  Regular diet  DISCHARGE CONDITION:  Good  ACTIVITY:  Activity as tolerated  OXYGEN:  Home Oxygen: No.   Oxygen Delivery: room air  DISCHARGE LOCATION:  home   If you experience worsening of your admission symptoms, develop  shortness of breath, life threatening emergency, suicidal or homicidal thoughts you must seek medical attention immediately by calling 911 or calling your MD immediately  if symptoms less severe.  You Must read complete instructions/literature along with all the possible adverse reactions/side effects for all the Medicines you take and that have been prescribed to you. Take any new Medicines after you have completely understood and accpet all the possible adverse reactions/side effects.   Please note  You were cared for by a hospitalist during your hospital stay. If you have any questions about your discharge medications or the care you received while you were in the hospital after you are discharged, you can call the unit and asked to speak with the hospitalist on call if the hospitalist that took care of you is not available. Once you are discharged, your primary care physician will handle any further medical issues. Please note that NO REFILLS for any discharge medications will be authorized once you are discharged, as it is imperative that you return to your primary care physician (or establish a  relationship with a primary care physician if you do not have one) for your aftercare needs so that they can reassess your need for medications and monitor your lab values.    On the day of Discharge:  VITAL SIGNS:  Blood pressure 111/56, pulse 67, temperature 97.5 F (36.4 C), temperature source Oral, resp. rate 27, height '5\' 6"'$  (1.676 m), weight 75.6 kg (166 lb 10.7 oz), SpO2 98 %.  PHYSICAL EXAMINATION:  GENERAL:  82 y.o.-year-old patient lying in the bed with no acute distress.  EYES: Pupils equal, round, reactive to light and accommodation. No scleral icterus. Extraocular muscles intact.  HEENT: Head atraumatic, normocephalic. Oropharynx and nasopharynx clear.  NECK:  Supple, no jugular venous distention. No thyroid enlargement, no tenderness.  LUNGS: Normal breath sounds bilaterally, no  wheezing, rales,rhonchi or crepitation. No use of accessory muscles of respiration.  CARDIOVASCULAR: S1, S2 normal. No murmurs, rubs, or gallops.  ABDOMEN: Soft, non-tender, non-distended. Bowel sounds present. No organomegaly or mass. cathetar in rt abdominal sidewall draining purulent drainage in bulb. EXTREMITIES: No pedal edema, cyanosis, or clubbing.  NEUROLOGIC: Cranial nerves II through XII are intact. Muscle strength 5/5 in all extremities. Sensation intact. Gait not checked.  PSYCHIATRIC: The patient is alert and oriented x 3.  SKIN: No obvious rash, lesion, or ulcer.  DATA REVIEW:   CBC  Recent Labs Lab 10/08/15 0327  WBC 8.6  HGB 10.3*  HCT 31.9*  PLT 265    Chemistries   Recent Labs Lab 10/06/15 1824  10/08/15 0327  NA 139  < > 138  K 3.2*  < > 3.9  CL 101  < > 101  CO2 28  < > 22  GLUCOSE 89  < > 173*  BUN 14  < > 8  CREATININE 1.12  < > 1.07  CALCIUM 8.3*  < > 8.3*  AST 23  --   --   ALT 12*  --   --   ALKPHOS 57  --   --   BILITOT 0.5  --   --   < > = values in this interval not displayed.  Cardiac Enzymes  Recent Labs Lab 10/06/15 1824  TROPONINI 0.33*    Microbiology Results  Results for orders placed or performed during the hospital encounter of 10/06/15  MRSA PCR Screening     Status: None   Collection Time: 10/07/15  2:18 AM  Result Value Ref Range Status   MRSA by PCR NEGATIVE NEGATIVE Final    Comment:        The GeneXpert MRSA Assay (FDA approved for NASAL specimens only), is one component of a comprehensive MRSA colonization surveillance program. It is not intended to diagnose MRSA infection nor to guide or monitor treatment for MRSA infections.   Culture, routine-abscess     Status: None (Preliminary result)   Collection Time: 10/08/15 10:30 AM  Result Value Ref Range Status   Specimen Description ABDOMEN  Final   Special Requests Normal  Final   Gram Stain   Final    MODERATE WBC SEEN RARE YEAST RARE GRAM NEGATIVE  RODS    Culture   Final    LIGHT GROWTH ENTEROCOCCUS FAECALIS HOLDING FOR POSSIBLE ADDITIONAL PATHOGEN    Report Status PENDING  Incomplete   Organism ID, Bacteria ENTEROCOCCUS FAECALIS  Final      Susceptibility   Enterococcus faecalis - MIC*    AMPICILLIN Value in next row Sensitive      SENSITIVE<=2    VANCOMYCIN  Value in next row Sensitive      SENSITIVE1    GENTAMICIN SYNERGY Value in next row Sensitive      SENSITIVE1    * LIGHT GROWTH ENTEROCOCCUS FAECALIS  Anaerobic culture     Status: None (Preliminary result)   Collection Time: 10/08/15 10:30 AM  Result Value Ref Range Status   Specimen Description ABDOMEN  Final   Special Requests Normal  Final   Culture HOLDING FOR POSSIBLE ANAEROBE  Final   Report Status PENDING  Incomplete    RADIOLOGY:  No results found.   Management plans discussed with the patient, family and they are in agreement.  CODE STATUS:  Code Status History    Date Active Date Inactive Code Status Order ID Comments User Context   10/07/2015 12:18 AM 10/08/2015  6:44 PM Full Code [id number]  Hillary Bow, MD ED   09/29/2015  5:10 PM 10/04/2015  4:54 PM Full Code [id number]  Inez Catalina, MD Inpatient   09/24/2015  6:46 PM 09/29/2015  5:10 PM Full Code [id number]  Bettey Costa, MD Inpatient   09/17/2015  2:05 PM 09/21/2015  3:59 PM Full Code [id number]  Wellington Hampshire, MD Inpatient   09/14/2015  4:25 PM 09/17/2015  2:05 PM Full Code [id number]  Leonie Green, MD Inpatient    Advance Directive Documentation        Most Recent Value   Type of Advance Directive  Living will   Pre-existing out of facility DNR order (yellow form or pink MOST form)     "MOST" Form in Place?        TOTAL TIME TAKING CARE OF THIS PATIENT: 45 minutes.    [facility], Lorry Anastasi M.D on 10/11/2015 at 10:20 AM  Between 7am to 6pm - Pager - [phone]  After 6pm go to [email or web address] - password [email or web address] [facility]  Office  [phone]  CC: Primary care physician; Adin Hector, MD Leonie Green, MD Abby Percell Locus, MD Alexis Goodell, MD  Note: This dictation was prepared with Dragon dictation along with smaller phrase technology. Any transcriptional errors that result from this process are unintentional.

## 2015-10-11 NOTE — Patient Outreach (Signed)
  Telephone call made to patient to discuss [place] Management services. He was discharged prior to writer reaching him or his wife. Attempted to contact patient via phone numbers at [phone] or [phone]. Left HIPPA compliant voicemail message to request call back.  Marthenia Rolling, MSN-Ed, RN,BSN [facility] Liaison [phone]

## 2015-10-12 DIAGNOSIS — I219 Acute myocardial infarction, unspecified: Secondary | ICD-10-CM

## 2015-10-12 HISTORY — DX: Acute myocardial infarction, unspecified: I21.9

## 2015-10-12 LAB — CULTURE, ROUTINE-ABSCESS: Special Requests: NORMAL

## 2015-10-14 ENCOUNTER — Ambulatory Visit
Admission: RE | Admit: 2015-10-14 | Discharge: 2015-10-14 | Disposition: A | Discharge status: Home / Self Care | Payer: Commercial Managed Care - HMO | Source: Ambulatory Visit | Attending: Surgery | Admitting: Surgery

## 2015-10-14 DIAGNOSIS — K659 Peritonitis, unspecified: Secondary | ICD-10-CM

## 2015-10-14 DIAGNOSIS — K8020 Calculus of gallbladder without cholecystitis without obstruction: Secondary | ICD-10-CM | POA: Diagnosis not present

## 2015-10-16 LAB — ANAEROBIC CULTURE: Special Requests: NORMAL

## 2015-10-18 ENCOUNTER — Ambulatory Visit (INDEPENDENT_AMBULATORY_CARE_PROVIDER_SITE_OTHER): Payer: Commercial Managed Care - HMO | Admitting: Internal Medicine

## 2015-10-18 ENCOUNTER — Encounter: Payer: Self-pay | Admitting: Internal Medicine

## 2015-10-18 ENCOUNTER — Ambulatory Visit: Payer: Commercial Managed Care - HMO | Admitting: Internal Medicine

## 2015-10-18 VITALS — BP 118/56 | HR 70 | Ht 66.0 in | Wt 167.0 lb

## 2015-10-18 DIAGNOSIS — I472 Ventricular tachycardia, unspecified: Secondary | ICD-10-CM | POA: Insufficient documentation

## 2015-10-18 DIAGNOSIS — I255 Ischemic cardiomyopathy: Secondary | ICD-10-CM

## 2015-10-18 DIAGNOSIS — D649 Anemia, unspecified: Secondary | ICD-10-CM | POA: Insufficient documentation

## 2015-10-18 DIAGNOSIS — E349 Endocrine disorder, unspecified: Secondary | ICD-10-CM | POA: Insufficient documentation

## 2015-10-18 DIAGNOSIS — D519 Vitamin B12 deficiency anemia, unspecified: Secondary | ICD-10-CM | POA: Insufficient documentation

## 2015-10-18 DIAGNOSIS — I714 Abdominal aortic aneurysm, without rupture, unspecified: Secondary | ICD-10-CM | POA: Insufficient documentation

## 2015-10-18 DIAGNOSIS — K219 Gastro-esophageal reflux disease without esophagitis: Secondary | ICD-10-CM | POA: Insufficient documentation

## 2015-10-18 DIAGNOSIS — I2510 Atherosclerotic heart disease of native coronary artery without angina pectoris: Secondary | ICD-10-CM | POA: Insufficient documentation

## 2015-10-18 DIAGNOSIS — I259 Chronic ischemic heart disease, unspecified: Secondary | ICD-10-CM

## 2015-10-18 DIAGNOSIS — G629 Polyneuropathy, unspecified: Secondary | ICD-10-CM | POA: Insufficient documentation

## 2015-10-18 HISTORY — DX: Ischemic cardiomyopathy: I25.5

## 2015-10-18 HISTORY — DX: Ventricular tachycardia, unspecified: I47.20

## 2015-10-18 HISTORY — DX: Ventricular tachycardia: I47.2

## 2015-10-18 MED ORDER — AMIODARONE HCL 200 MG PO TABS: 200.0000 mg | ORAL_TABLET | Freq: Every day | ORAL | Status: DC

## 2015-10-18 NOTE — Patient Instructions (Signed)
Medication Instructions: 1) Take lasix (furosemide) 20 mg two tablets (40 mg) by mouth once daily x 5 days  Labwork: - none  Procedures/Testing: - none  Follow-Up: - Dr. Caryl Comes will see you back on an as needed basis.  Any Additional Special Instructions Will Be Listed Below (If Applicable). - You are ok to stop wearing your LifeVest - Dr. Alveria Apley office will be in touch with you to schedule an echocardiogram and a follow up appointment with him in the next 2 weeks.    If you need a refill on your cardiac medications before your next appointment, please call your pharmacy.

## 2015-10-18 NOTE — Progress Notes (Signed)
sko      ELECTROPHYSIOLOGY CONSULT NOTE  Patient ID: Nicholas Parker, MRN: [id number], DOB/AGE: 1934/03/14 82 y.o. Admit date: (Not on file) Date of Consult: 10/18/2015  Primary Physician: Adin Hector, MD Primary Cardiologist: [facility]) Consulting Physician smae  Chief Complaint: ICD   HPI Nicholas Parker is a 82 y.o. male  Referred for consideration of an ICD.  In January 2017 he underwent colectomy for a tubular adenoma. Postoperatively, he developed hypotension and was found to have an acute RCA MI. He subsequently underwent drug-eluting stenting  His postoperative course was complicated by nausea and he was found to have an intra-abdominal abscess  Having been readmitted with hypotension requiring Levophed  EF 35-40% 1/17  In the course of these events, he developed sustained ventricular tachycardia. The duration is appears to be as long as 40 minutes based on the telemetry time of 04 30 in a nursing note from 05 10; he was treated with amiodarone and lidocaine  he was given a LifeVest at discharge  This LifeVest is driving him crazy.  His abdominal drains were removed yesterday. He is weak and somewhat unstable on his feet. He ascribes this to pain   He has not had syncope.  An echocardiogram 2015 had demonstrated near-normal LV function 50-55%  Past Medical History  Diagnosis Date  . Hypertension   . Diabetes mellitus without complication ([place])   . Hyperlipidemia   . Coronary artery disease   . neuropathy   . Low testosterone   . Neuropathy ([place])   . Anemia   . B12 deficiency   . GERD (gastroesophageal reflux disease)   . Skin cancer     Basal Cell  . Adenocarcinoma ([place])   . AAA (abdominal aortic aneurysm) ([place])   . Diverticulosis   . Cardiomyopathy, ischemic 10/18/2015  . Ventricular tachycardia, sustained ([place]) 10/18/2015  . Myocardial infarction [facility]) 10/12/2015      Surgical History:  Past Surgical History  Procedure Laterality Date  . Arthrosopic  rotator cuff repair Left   . Robotic colectomy    . Coronary angioplasty    . Vascular surgery    . Colonoscopy with propofol N/A 04/13/2015    Procedure: COLONOSCOPY WITH PROPOFOL;  Surgeon: Lollie Sails, MD;  Location: [facility] ENDOSCOPY;  Service: Endoscopy;  Laterality: N/A;  . Colonoscopy with propofol N/A 04/14/2015    Procedure: COLONOSCOPY WITH PROPOFOL;  Surgeon: Lollie Sails, MD;  Location: [facility] ENDOSCOPY;  Service: Endoscopy;  Laterality: N/A;  . Colonoscopy with propofol N/A 07/20/2015    Procedure: COLONOSCOPY WITH PROPOFOL;  Surgeon: Lollie Sails, MD;  Location: [facility] ENDOSCOPY;  Service: Endoscopy;  Laterality: N/A;  . Colon surgery    . Eye surgery Bilateral     Cataract Extraction with IOL  . Abdominal aortic aneurysm repair  2014    Dr. Delana Meyer, [facility]  . Laparoscopic right colectomy Right 09/14/2015    Procedure: LAPAROSCOPIC RIGHT COLECTOMY  CONVERTED TO OPEN RIGHT COLECTOMY, LYSIS OF ADHESIONS;  Surgeon: Leonie Green, MD;  Location: [facility] ORS;  Service: General;  Laterality: Right;  . Cardiac catheterization N/A 09/17/2015    Procedure: Left Heart Cath and Coronary Angiography;  Surgeon: Wellington Hampshire, MD;  Location: [place] CV LAB;  Service: Cardiovascular;  Laterality: N/A;  . Cardiac catheterization N/A 09/17/2015    Procedure: Coronary Stent Intervention;  Surgeon: Wellington Hampshire, MD;  Location: [place] CV LAB;  Service: Cardiovascular;  Laterality: N/A;     Home  Meds: Prior to Admission medications   Medication Sig Start Date End Date Taking? Authorizing Provider  amiodarone (PACERONE) 400 MG tablet Take 1 tablet (400 mg total) by mouth daily. 10/04/15  Yes Epifanio Lesches, MD  amiodarone (PACERONE) 400 MG tablet Take 200 mg by mouth daily.   Yes Historical Provider, MD  ampicillin (PRINCIPEN) 500 MG capsule Take 1 capsule (500 mg total) by mouth 4 (four) times daily. 10/08/15  Yes Max Sane, MD  aspirin EC 81 MG tablet Take 81 mg by  mouth daily.   Yes Historical Provider, MD  ciprofloxacin (CIPRO) 500 MG tablet Take 500 mg by mouth 2 (two) times daily. 10/15/15 10/25/15 Yes Historical Provider, MD  clopidogrel (PLAVIX) 75 MG tablet Take 1 tablet (75 mg total) by mouth daily with breakfast. 09/21/15  Yes Leonie Green, MD  furosemide (LASIX) 20 MG tablet Take 1 tablet (20 mg total) by mouth 2 (two) times daily. 10/04/15  Yes Epifanio Lesches, MD  lisinopril (PRINIVIL,ZESTRIL) 5 MG tablet Take 5 mg by mouth daily.   Yes Historical Provider, MD  loratadine (CLARITIN) 10 MG tablet Take 10 mg by mouth daily.   Yes Historical Provider, MD  metFORMIN (GLUCOPHAGE) 500 MG tablet Take 500 mg by mouth 2 (two) times daily with a meal.    Yes Historical Provider, MD  metoprolol succinate (TOPROL-XL) 25 MG 24 hr tablet Take 25 mg by mouth daily.   Yes Historical Provider, MD  oxyCODONE-acetaminophen (PERCOCET/ROXICET) 5-325 MG tablet Take 1 tablet by mouth every 4 (four) hours as needed for severe pain.   Yes Historical Provider, MD  pantoprazole (PROTONIX) 40 MG tablet Take 40 mg by mouth daily.    Yes Historical Provider, MD  simvastatin (ZOCOR) 40 MG tablet Take 40 mg by mouth at bedtime.    Yes Historical Provider, MD  vitamin B-12 (CYANOCOBALAMIN) 500 MCG tablet Take 500 mcg by mouth daily.   Yes Historical Provider, MD    Allergies:  Allergies  Allergen Reactions  . Lipitor [Atorvastatin] Other (See Comments)    Reaction:  Leg cramps   . Pravachol [Pravastatin Sodium] Other (See Comments)    Reaction:  Leg cramps  . Riomet [Metformin Hcl] Diarrhea  . Vytorin [Ezetimibe-Simvastatin] Other (See Comments)    Reaction:  Leg cramps    Social History   Social History  . Marital Status: Married    Spouse Name: N/A  . Number of Children: N/A  . Years of Education: N/A   Occupational History  . Not on file.   Social History Main Topics  . Smoking status: Former Smoker -- 1.50 packs/day    Types: Cigarettes    Quit  date: 10/03/2003  . Smokeless tobacco: Never Used  . Alcohol Use: 0.6 oz/week    1 Cans of beer per week  . Drug Use: No  . Sexual Activity: Not on file   Other Topics Concern  . Not on file   Social History Narrative     Family History  Problem Relation Age of Onset  . CAD Mother   . Diabetes type II Mother   . Heart attack Mother   . Lung cancer Father   . Glaucoma Other      ROS:  Please see the history of present illness.     All other systems reviewed and negative.    Physical Exam:   Blood pressure 118/56, pulse 70, height '5\' 6"'$  (1.676 m), weight 167 lb (75.751 kg). General: Well developed, well nourished male  in no acute distress. Head: Normocephalic, atraumatic, sclera non-icteric, no xanthomas, nares are without discharge. EENT: normal  Lymph Nodes:  none Neck: Negative for carotid bruits. JVD 9 cm Back:without scoliosis kyphosis  Lungs: Clear bilaterally to auscultation without wheezes, rales, or rhonchi. Breathing is unlabored. Heart: RRR with S1 S2. 2/6 systolic  murmur . No rubs, or gallops appreciated. Abdomen: Soft, non-tender, non-distended with normoactive bowel sounds. No hepatomegaly. No rebound/guarding. No obvious abdominal masses. Msk:  Strength and tone appear normal for age. Extremities: No clubbing or cyanosis.  2 + right 1+*left edema.  Distal pedal pulses are 2+ and equal bilaterally. Skin: Warm and Dry Neuro: Alert and oriented X 3. CN III-XII intact Grossly normal sensory and motor function . Psych:  Responds to questions appropriately with a normal affect.      Labs: Cardiac Enzymes No results for input(s): CKTOTAL, CKMB, TROPONINI in the last 72 hours. CBC Lab Results  Component Value Date   WBC 8.6 10/08/2015   HGB 10.3* 10/08/2015   HCT 31.9* 10/08/2015   MCV 84.4 10/08/2015   PLT 265 10/08/2015   PROTIME: No results for input(s): LABPROT, INR in the last 72 hours. Chemistry No results for input(s): NA, K, CL, CO2, BUN,  CREATININE, CALCIUM, PROT, BILITOT, ALKPHOS, ALT, AST, GLUCOSE in the last 168 hours.  Invalid input(s): LABALBU Lipids No results found for: CHOL, HDL, LDLCALC, TRIG BNP No results found for: PROBNP Thyroid Function Tests: No results for input(s): TSH, T4TOTAL, T3FREE, THYROIDAB in the last 72 hours.  Invalid input(s): FREET3 Miscellaneous No results found for: DDIMER  Radiology/Studies:  Ct Abdomen Pelvis Wo Contrast  10/14/2015  CLINICAL DATA:  Abdominal abscess follow-up. EXAM: CT ABDOMEN AND PELVIS WITHOUT CONTRAST TECHNIQUE: Multidetector CT imaging of the abdomen and pelvis was performed following the standard protocol without IV contrast. COMPARISON:  10/08/2015. FINDINGS: Lower chest and abdominal wall: Small right pleural effusion with atelectasis, stable from prior. Hepatobiliary: No focal liver abnormality. Cholelithiasis. Stones nearly fill the gallbladder lumen. No superimposed inflammatory or obstructive changes. Pancreas: Unremarkable. Spleen: Unremarkable. Adrenals/Urinary Tract: Negative adrenals. No hydronephrosis or stone (hilar calcifications appear atherosclerotic. Unremarkable bladder. Reproductive:Unremarkable for age. Stomach/Bowel: Right hemicolectomy. There is also a staple line at the rectum. No obstruction or inflammation. Vascular/Lymphatic: Aorto bi-iliac stenting for treatment of abdominal aortic aneurysm. No acute vascular finding. Diffuse atherosclerotic calcification. No mass or adenopathy. Peritoneal: Diminished abscess in the right pericolic gutter. A percutaneous catheter is in stable position compared to placement. The residual gas collection with few bubbles measures 24 x 20 x 34 mm. The collection has decreased by at least 50%. Musculoskeletal: Multilevel spinal ankylosis from osteophytes. Sacroiliac ankylosis. IMPRESSION: 1. Diminished right pericolic gutter abscess. The residual collection measures 24 x 20 x 34 mm and is in contact with the percutaneous  catheter. 2. Cholelithiasis. Electronically Signed   By: Monte Fantasia M.D.   On: 10/14/2015 13:41   Ct Abdomen Pelvis Wo Contrast  09/29/2015  CLINICAL DATA:  Right lower quadrant pain following right colectomy and lysis of adhesions EXAM: CT ABDOMEN AND PELVIS WITHOUT CONTRAST TECHNIQUE: Multidetector CT imaging of the abdomen and pelvis was performed following the standard protocol without IV contrast. COMPARISON:  09/24/2015 FINDINGS: Lung bases demonstrate bilateral pleural effusions. These are new from the prior exam. The liver, gallbladder, spleen, adrenal glands and pancreas are all stable from the prior exam. Multiple gallstones are again seen within the gallbladder. The kidneys demonstrate renal vascular calcifications although no calculi are identified. No obstructive  changes are seen. Changes consistent with prior aortic stent graft repair are noted. No expansion of the aneurysm sac is seen. The bladder is well distended. There are again noted postsurgical changes consistent with prior distal colectomy as well as recent right colectomy. Contrast material is noted within the colon without significant small bowel dilatation indicating a patent anastomosis. In the right mid abdomen however extending from the tip of the liver there is a complex fluid collection identified which extends for approximately 18 cm along the right pericolic gutter. It measures approximately 5.6 cm in greatest AP dimension and approximately 7.7 cm in greatest transverse dimension. This has increased in the interval from the prior exam and is consistent with postoperative abscess. Within the fluid collection there is some mottled air suggesting fecal material although to this is uncertain on the basis of this exam. A few other areas of free fluid are noted particularly surrounding the spleen as well as a few pockets in the left pericolic gutter. These may be sterile and are not significantly increased when compared with the  prior exam. The osseous structures are stable. IMPRESSION: New right lower quadrant abscess as described above. These findings were communicated to Dr. Tamala Julian at time of exam interpretation the patient is planned for percutaneous drainage in the near future. Cholelithiasis without complicating factors. Postsurgical changes as described. Electronically Signed   By: Inez Catalina M.D.   On: 09/29/2015 13:36   Ct Abdomen Pelvis Wo Contrast  09/24/2015  ADDENDUM REPORT: 09/24/2015 13:15 ADDENDUM: It should be noted the kidneys are well visualized and demonstrate no definitive obstructive change. No renal calculi are seen although heavy renal vascular calcification is noted. Electronically Signed   By: Inez Catalina M.D.   On: 09/24/2015 13:15  09/24/2015  CLINICAL DATA:  Postop abdominal pain, history of recent colonic surgery EXAM: CT ABDOMEN AND PELVIS WITHOUT CONTRAST TECHNIQUE: Multidetector CT imaging of the abdomen and pelvis was performed following the standard protocol without IV contrast. COMPARISON:  04/26/2015. FINDINGS: Lung bases are free of acute infiltrate or sizable effusion. The liver, spleen, adrenal glands and pancreas are all normal in their CT appearance. The gallbladder is well distended and demonstrates multiple dependent gallstones. No definitive pericholecystic fluid is seen. Small amounts of free fluid are noted surrounding the liver and spleen. These changes are likely related to the recent colonic resection. Postsurgical changes are noted in the right colon consistent with the patient's given clinical history. No obstructive changes are seen. A tiny focus of air is noted adjacent to the suture line again likely related to the recent surgery. The anastomosis appears widely patent. Changes of prior sigmoid colectomy are also noted. No significant obstructive changes are noted. Aortic stent graft is noted in satisfactory position. Comparison with prior exam shows a stable appearing aneurysm  sac. Iliac calcifications are seen without aneurysmal dilatation. The bladder is well distended. Prostate calcifications are seen. No significant lymphadenopathy is noted. The osseous structures show degenerative change of the lumbar spine. IMPRESSION: Small amounts of free fluid and free air likely related to the recent colonic surgery. No significant findings to suggest acute perforation are noted at this time. Multiple gallstones without definitive complicating factors. Electronically Signed: By: Inez Catalina M.D. On: 09/24/2015 12:29   Dg Abd 1 View  09/25/2015  CLINICAL DATA:  82 year old male with abdominal distension and pain. Recent colonic resection. EXAM: ABDOMEN - 1 VIEW COMPARISON:  09/24/2015 CT FINDINGS: Gaseous distension of the colon is noted. There is some  oral contrast from recent CT present within the proximal colon. No dilated small bowel loops are noted. Surgical staples and clips are identified as well as an aortic stent graft. IMPRESSION: Gaseous colonic distention likely representing ileus. No dilated small bowel loops identified. Electronically Signed   By: Margarette Canada M.D.   On: 09/25/2015 10:53   Ct Head Wo Contrast  10/07/2015  CLINICAL DATA:  Acute onset of seizure. Tremors. Hypoglycemia. Initial encounter. EXAM: CT HEAD WITHOUT CONTRAST TECHNIQUE: Contiguous axial images were obtained from the base of the skull through the vertex without intravenous contrast. COMPARISON:  None. FINDINGS: There is no evidence of acute infarction, mass lesion, or intra- or extra-axial hemorrhage on CT. Prominence of the ventricles and sulci reflects mild cortical volume loss. Mild periventricular white matter change likely reflects small vessel ischemic microangiopathy. Mild cerebellar atrophy is noted. The brainstem and fourth ventricle are within normal limits. The basal ganglia are unremarkable in appearance. The cerebral hemispheres demonstrate grossly normal gray-white differentiation. No mass  effect or midline shift is seen. There is no evidence of fracture; visualized osseous structures are unremarkable in appearance. The orbits are within normal limits. The paranasal sinuses and mastoid air cells are well-aerated. No significant soft tissue abnormalities are seen. IMPRESSION: 1. No acute intracranial pathology seen on CT. 2. Mild cortical volume loss and scattered small vessel ischemic microangiopathy. Electronically Signed   By: Garald Balding M.D.   On: 10/07/2015 01:04   Ct Abdomen Pelvis W Contrast  10/06/2015  CLINICAL DATA:  Lower abdominal pain. EXAM: CT ABDOMEN AND PELVIS WITH CONTRAST TECHNIQUE: Multidetector CT imaging of the abdomen and pelvis was performed using the standard protocol following bolus administration of intravenous contrast. CONTRAST:  157m OMNIPAQUE IOHEXOL 300 MG/ML  SOLN COMPARISON:  CT scan of September 29, 2015. FINDINGS: Mild right pleural effusion is noted with adjacent subsegmental atelectasis. No acute osseous abnormality is noted. Cholelithiasis is noted without inflammation. The liver, spleen and pancreas appear normal. Adrenal glands and kidneys appear normal. No hydronephrosis or renal obstruction is noted. Status post endovascular repair of abdominal aortic aneurysm. The graft and its limbs are patent without evidence of endoleak. Urinary bladder appears normal. Prostatic calcifications are noted. There is no evidence of bowel obstruction. Status post right colectomy. There remains abscess along the right lateral wall inferior to the liver which measures 14.6 by 5.2 x 3.1 cm. No catheter is noted within it. No significant adenopathy is noted. IMPRESSION: Cholelithiasis. Status post endovascular repair of infrarenal abdominal aortic aneurysm. No endoleak is noted. Mild right pleural effusion with adjacent subsegmental atelectasis. Status post right colectomy. 14.6 x 5.2 x 3.1 cm abscess remains along the right lateral chest wall and extending into right pericolic  gutter. Electronically Signed   By: JMarijo Conception M.D.   On: 10/06/2015 21:53   [place]Venous Img Upper Uni Left  10/01/2015  CLINICAL DATA:  Left upper extremity edema. EXAM: LEFT UPPER EXTREMITY VENOUS DOPPLER ULTRASOUND TECHNIQUE: Gray-scale sonography with graded compression, as well as color Doppler and duplex ultrasound were performed to evaluate the upper extremity deep venous system from the level of the subclavian vein and including the jugular, axillary, basilic, radial, ulnar and upper cephalic vein. Spectral Doppler was utilized to evaluate flow at rest and with distal augmentation maneuvers. COMPARISON:  None. FINDINGS: Contralateral Subclavian Vein: Respiratory phasicity is normal and symmetric with the symptomatic side. No evidence of thrombus. Normal compressibility. Internal Jugular Vein: No evidence of thrombus. Normal compressibility, respiratory phasicity  and response to augmentation. Subclavian Vein: No evidence of thrombus. Normal compressibility, respiratory phasicity and response to augmentation. Axillary Vein: No evidence of thrombus. Normal compressibility, respiratory phasicity and response to augmentation. Cephalic Vein: No evidence of thrombus. Normal compressibility, respiratory phasicity and response to augmentation. Basilic Vein: No evidence of thrombus. Normal compressibility, respiratory phasicity and response to augmentation. Brachial Veins: No evidence of thrombus. Normal compressibility, respiratory phasicity and response to augmentation. Radial Veins: No evidence of thrombus. Normal compressibility, respiratory phasicity and response to augmentation. Ulnar Veins: No evidence of thrombus. Normal compressibility, respiratory phasicity and response to augmentation. Venous Reflux:  None visualized. Other Findings: No evidence of superficial thrombophlebitis or abnormal fluid collection. IMPRESSION: No evidence of left upper extremity deep venous thrombosis. Electronically Signed    By: Aletta Edouard M.D.   On: 10/01/2015 12:31   Dg Chest Portable 1 View  09/24/2015  CLINICAL DATA:  Central line placement.  NG tube placement EXAM: PORTABLE CHEST 1 VIEW COMPARISON:  09/24/2015 FINDINGS: Right central line tip is in the SVC. No pneumothorax. NG tube enters the stomach. Heart is upper limits normal in size. Bibasilar opacities, likely atelectasis. Mild vascular congestion. No effusions or acute bony abnormality. IMPRESSION: Right central line tip in the SVC. No pneumothorax. NG tube enters the stomach. Mild vascular congestion and bibasilar atelectasis. Electronically Signed   By: Rolm Baptise M.D.   On: 09/24/2015 18:01   Dg Chest Port 1 View  09/24/2015  CLINICAL DATA:  Recent colon resection, epigastric pain, shortness of breath, last bowel movement yesterday, hypertension, diabetes mellitus, coronary artery disease post angioplasty, hyperlipidemia, GERD EXAM: PORTABLE CHEST 1 VIEW COMPARISON:  Portable exam 1000 hours compared to 04/14/2014 FINDINGS: Enlargement of cardiac silhouette. Atherosclerotic calcification aorta. Mediastinal contours and pulmonary vascularity normal for technique. Lungs clear. No pleural effusion or pneumothorax. Bones demineralized. IMPRESSION: Minimal enlargement of cardiac silhouette. No acute abnormalities. Electronically Signed   By: Lavonia Dana M.D.   On: 09/24/2015 10:25   Dg Abd Acute W/chest  09/27/2015  CLINICAL DATA:  Abdominal pain.  Recent partial colectomy EXAM: DG ABDOMEN ACUTE W/ 1V CHEST COMPARISON:  Two days ago FINDINGS: Normal heart size. Negative aortic and hilar contours. Right IJ central line, tip at the SVC level. COPD changes. There is no edema, consolidation, effusion, or pneumothorax. Unchanged gaseous distention of colon, greatest in the left abdomen. Gas and stool reaches the rectum. No pneumoperitoneum. No evidence of pneumatosis. Aortoiliac stenting. IMPRESSION: Unchanged gaseous distention of the postoperative colon, likely  adynamic ileus. Electronically Signed   By: Monte Fantasia M.D.   On: 09/27/2015 05:56   Ct Image Guided Drainage By Percutaneous Catheter  10/08/2015  INDICATION: Persistent right mid abdominal abscess despite previous drainage EXAM: CT GUIDED DRAINAGE OF POSTOPERATIVE RIGHT ABDOMINAL ABSCESS MEDICATIONS: The patient is currently admitted to the hospital and receiving intravenous antibiotics. The antibiotics were administered within an appropriate time frame prior to the initiation of the procedure. ANESTHESIA/SEDATION: None COMPLICATIONS: None immediate. TECHNIQUE: Informed written consent was obtained from the patient after a thorough discussion of the procedural risks, benefits and alternatives. All questions were addressed. Maximal Sterile Barrier Technique was utilized including caps, mask, sterile gowns, sterile gloves, sterile drape, hand hygiene and skin antiseptic. A timeout was performed prior to the initiation of the procedure. PROCEDURE: The operative field was prepped with chlorhexidine in a sterile fashion, and a sterile drape was applied covering the operative field. A sterile gown and sterile gloves were used for the procedure. Local  anesthesia was provided with 1% Lidocaine. Utilizing 1% lidocaine as a local anesthetic and CT fluoroscopic guidance, an 18 gauge trocar needle was placed into the bilobed residual abscess cavity in the right mid abdomen. The more anterior approach was performed to allow of a catheter courses through both lobes of the collection. An Amplatz wire was then coiled within the inferior aspect of the collection and dilatation new 10 [place] was performed. A 12 French drainage catheter was then placed and fluid was aspirated which was overtly purulent. Samples were sent for laboratory evaluation. The catheter was then sewn into place utilizing 0 Prolene. The patient tolerated the procedure well. The catheter was then placed to an external suction grenade. The patient was  then returned to his room in satisfactory condition. FINDINGS: Bilobed abscess cavity similar to that noted on the prior exam of 10/06/2015. The overall appearance is reduced in size from the initial exam obtained on 09/29/2015. IMPRESSION: Successful placement of a 12 French drainage catheter as described. Samples were sent to the lab for evaluation. REPEAT CT EXAMINATION IS MANDATORY PRIOR TO CATHETER REMOVAL TO AVOID PERSISTENT/RECURRENT ABSCESS FORMATION. This was discussed with the patient's family at the time of exam performance. Electronically Signed   By: Inez Catalina M.D.   On: 10/08/2015 12:11   Ct Image Guided Drainage By Percutaneous Catheter  09/29/2015  INDICATION: History of recent right colectomy with enlarging right pericolic fluid collection now with air within the suggestive of intra-abdominal abscess. EXAM: CT GUIDED DRAINAGE OF RIGHT PERICOLIC ABSCESS MEDICATIONS: The patient is currently admitted to the hospital and receiving intravenous antibiotics. The antibiotics were administered within an appropriate time frame prior to the initiation of the procedure. ANESTHESIA/SEDATION: NONE COMPLICATIONS: None immediate. TECHNIQUE: Informed written consent was obtained from the patient after a thorough discussion of the procedural risks, benefits and alternatives. All questions were addressed. Maximal Sterile Barrier Technique was utilized including caps, mask, sterile gowns, sterile gloves, sterile drape, hand hygiene and skin antiseptic. A timeout was performed prior to the initiation of the procedure. PROCEDURE: The operative field was prepped with Chlorhexidine in a sterile fashion, and a sterile drape was applied covering the operative field. A sterile gown and sterile gloves were used for the procedure. Local anesthesia was provided with 1% Lidocaine. Utilizing CT guidance and a quick stick technique a 10 French drainage catheter was placed into the right pericolic fluid collection without  difficulty. Turbid serous fluid was withdrawn without difficulty and sent for laboratory evaluation. Catheter was then sutured into place utilizing 0 Prolene and placed to an external suction grenade. The catheter was then dressed in the standard manner. The patient tolerated the procedure well and was returned to his room in satisfactory condition. FINDINGS: Stable air-fluid collection in the right pericolic gutter similar to that seen on recent CT examination. IMPRESSION: Successful placement of a drainage catheter under CT guidance as described. Electronically Signed   By: Inez Catalina M.D.   On: 09/29/2015 16:51    EKG:  Sinus rhythm at 70 Intervals 17/14/45 Right bundle branch block   Assessment and Plan:  Sustained ventricular tachycardia  Myocardial infarction-RCA-1/17 perioperatively Treated with a stent  DES  Atrial fibrillation  Status post colectomy complicated by intra-abdominal abscess    He patient's most recent echocardiogram had an EF of 35-40%. The patient is not a great candidate at this tim the fact that his drains were just removed. Furthermore, he is not inclined to undergo her procedure. The patient's ejection fraction is  good, amiodarone  Was comparable to an ICD in outcomes in the AVID  Trial.  We will reassess his left entricular function. Hopefully this continued to improve.  He is not inclined to continue wearing a LIFEVEST. I think it is reasonable to discontinue it eespecially given the fact that he has been on amiodarone now for about 3 weeks.  We will also be valuable for his atrial fibrillation which he has by report although I do not see these tracings in [place]

## 2015-10-22 ENCOUNTER — Telehealth: Payer: Self-pay | Admitting: Internal Medicine

## 2015-10-22 NOTE — Telephone Encounter (Signed)
Pt is taking Simvastatin, pt has a new prescription for Amiodarone. There is an interaction between these two medication. Do you still want to do the Amiodarone.

## 2015-10-22 NOTE — Addendum Note (Signed)
Addended by: Freada Bergeron on: 10/22/2015 02:25 PM   Modules accepted: Orders

## 2015-10-25 NOTE — Telephone Encounter (Signed)
Yes, I did and I faxed it back to [facility].

## 2015-10-25 NOTE — Telephone Encounter (Signed)
Mindy, I think Dr. Caryl Comes filled out a fax on this patient that you brought around last week. I left it in your chair and think it was for this person. He was going to change him to lovastatin. Do you remember seeing the fax?

## 2015-10-28 ENCOUNTER — Telehealth: Payer: Self-pay | Admitting: Internal Medicine

## 2015-10-28 NOTE — Telephone Encounter (Signed)
Fax received from [facility] advising the patient is on amiodarone and simvastatin. Requesting if ok to fill simvastatin with amiodarone. The patient was seen in consult with Dr. Caryl Comes on 10/18/15 at the request of Dr. Nehemiah Massed. He was already on amiodarone and simvastatin. I faxed this form to Dr. Alveria Apley office to address this issue. Fax received back from Dr. Alveria Apley office that they were aware of the issue and they will address.

## 2015-11-01 DIAGNOSIS — E162 Hypoglycemia, unspecified: Secondary | ICD-10-CM | POA: Diagnosis not present

## 2015-11-01 DIAGNOSIS — E119 Type 2 diabetes mellitus without complications: Secondary | ICD-10-CM | POA: Diagnosis not present

## 2015-11-03 DIAGNOSIS — I10 Essential (primary) hypertension: Secondary | ICD-10-CM | POA: Diagnosis not present

## 2015-11-03 DIAGNOSIS — I2119 ST elevation (STEMI) myocardial infarction involving other coronary artery of inferior wall: Secondary | ICD-10-CM | POA: Diagnosis not present

## 2015-11-03 DIAGNOSIS — I2510 Atherosclerotic heart disease of native coronary artery without angina pectoris: Secondary | ICD-10-CM | POA: Diagnosis not present

## 2015-11-03 DIAGNOSIS — I472 Ventricular tachycardia: Secondary | ICD-10-CM | POA: Diagnosis not present

## 2015-11-03 DIAGNOSIS — E782 Mixed hyperlipidemia: Secondary | ICD-10-CM | POA: Diagnosis not present

## 2015-11-04 DIAGNOSIS — L718 Other rosacea: Secondary | ICD-10-CM | POA: Diagnosis not present

## 2015-11-11 ENCOUNTER — Ambulatory Visit: Payer: Commercial Managed Care - HMO | Admitting: Internal Medicine

## 2015-11-17 ENCOUNTER — Encounter: Payer: Self-pay | Admitting: *Deleted

## 2015-11-17 ENCOUNTER — Other Ambulatory Visit: Payer: Self-pay | Admitting: *Deleted

## 2015-11-17 NOTE — Patient Outreach (Signed)
[facility]) Care Management  11/17/2015  Nicholas Parker Aug 12, 1934 [id number]   Subjective: Telephone call to patient's home number, spoke with patient, and HIPAA verified.   Patient states he is doing well.   Discussed [facility] Care Management services, patient verbalized understanding, and patient declined services.   States he has received several calls regarding [facility] and he does not need anything.  States he is appreciative of the follow up call but does not need services at this time.  Patient in agreement to receive [place] Management program information.  Patient states his primary Dr. Ramonita Parker is taking care of everything.   Patient states he has no care coordination, community resource, disease management, disease education or pharmacy needs at this time.    Objective: Per Epic case review: Patient hospitalized 10/06/15 - 10/08/15 for hypoglycemia and seizure. Patient has a history of diabetes, CAD, hyperlipidemia,  abdominal abscess, and hypertension.  Assessment: Received [facility] referral on 11/16/15.   Referral source: Jettie Booze.   Referral reason:  Disease and symptom management.   Services requested: Telephonic RNCM.    Telephone screen completed and patient refused services.   Plan: RNCM will send case closure letter due to refusal of services to patient's primary MD. [facility] will send patient successful outreach letter, [facility] magnet, and pamphlet. RNCM will send case closure due to refusal of services request to [place] [facility].  Nita Whitmire H. Annia Friendly, BSN, [place] Management [facility] Telephonic CM Phone: [phone] Fax: [phone]

## 2015-12-03 DIAGNOSIS — X32XXXA Exposure to sunlight, initial encounter: Secondary | ICD-10-CM | POA: Diagnosis not present

## 2015-12-03 DIAGNOSIS — Z08 Encounter for follow-up examination after completed treatment for malignant neoplasm: Secondary | ICD-10-CM | POA: Diagnosis not present

## 2015-12-03 DIAGNOSIS — D0439 Carcinoma in situ of skin of other parts of face: Secondary | ICD-10-CM | POA: Diagnosis not present

## 2015-12-03 DIAGNOSIS — L218 Other seborrheic dermatitis: Secondary | ICD-10-CM | POA: Diagnosis not present

## 2015-12-03 DIAGNOSIS — Z85828 Personal history of other malignant neoplasm of skin: Secondary | ICD-10-CM | POA: Diagnosis not present

## 2015-12-03 DIAGNOSIS — L821 Other seborrheic keratosis: Secondary | ICD-10-CM | POA: Diagnosis not present

## 2015-12-03 DIAGNOSIS — D485 Neoplasm of uncertain behavior of skin: Secondary | ICD-10-CM | POA: Diagnosis not present

## 2015-12-03 DIAGNOSIS — L570 Actinic keratosis: Secondary | ICD-10-CM | POA: Diagnosis not present

## 2016-01-13 DIAGNOSIS — L570 Actinic keratosis: Secondary | ICD-10-CM | POA: Diagnosis not present

## 2016-01-13 DIAGNOSIS — D0439 Carcinoma in situ of skin of other parts of face: Secondary | ICD-10-CM | POA: Diagnosis not present

## 2016-02-04 DIAGNOSIS — I10 Essential (primary) hypertension: Secondary | ICD-10-CM | POA: Diagnosis not present

## 2016-02-04 DIAGNOSIS — Z79899 Other long term (current) drug therapy: Secondary | ICD-10-CM | POA: Diagnosis not present

## 2016-02-04 DIAGNOSIS — E118 Type 2 diabetes mellitus with unspecified complications: Secondary | ICD-10-CM | POA: Diagnosis not present

## 2016-02-04 DIAGNOSIS — D6489 Other specified anemias: Secondary | ICD-10-CM | POA: Diagnosis not present

## 2016-02-04 DIAGNOSIS — D518 Other vitamin B12 deficiency anemias: Secondary | ICD-10-CM | POA: Diagnosis not present

## 2016-02-11 DIAGNOSIS — E782 Mixed hyperlipidemia: Secondary | ICD-10-CM | POA: Diagnosis not present

## 2016-02-11 DIAGNOSIS — R569 Unspecified convulsions: Secondary | ICD-10-CM | POA: Diagnosis not present

## 2016-02-11 DIAGNOSIS — E118 Type 2 diabetes mellitus with unspecified complications: Secondary | ICD-10-CM | POA: Diagnosis not present

## 2016-02-11 DIAGNOSIS — I10 Essential (primary) hypertension: Secondary | ICD-10-CM | POA: Diagnosis not present

## 2016-02-11 DIAGNOSIS — I714 Abdominal aortic aneurysm, without rupture: Secondary | ICD-10-CM | POA: Diagnosis not present

## 2016-02-11 DIAGNOSIS — G629 Polyneuropathy, unspecified: Secondary | ICD-10-CM | POA: Diagnosis not present

## 2016-02-11 DIAGNOSIS — I2510 Atherosclerotic heart disease of native coronary artery without angina pectoris: Secondary | ICD-10-CM | POA: Diagnosis not present

## 2016-02-11 DIAGNOSIS — I2119 ST elevation (STEMI) myocardial infarction involving other coronary artery of inferior wall: Secondary | ICD-10-CM | POA: Diagnosis not present

## 2016-05-08 DIAGNOSIS — X32XXXA Exposure to sunlight, initial encounter: Secondary | ICD-10-CM | POA: Diagnosis not present

## 2016-05-08 DIAGNOSIS — D0461 Carcinoma in situ of skin of right upper limb, including shoulder: Secondary | ICD-10-CM | POA: Diagnosis not present

## 2016-05-08 DIAGNOSIS — C44319 Basal cell carcinoma of skin of other parts of face: Secondary | ICD-10-CM | POA: Diagnosis not present

## 2016-05-08 DIAGNOSIS — Z08 Encounter for follow-up examination after completed treatment for malignant neoplasm: Secondary | ICD-10-CM | POA: Diagnosis not present

## 2016-05-08 DIAGNOSIS — D485 Neoplasm of uncertain behavior of skin: Secondary | ICD-10-CM | POA: Diagnosis not present

## 2016-05-08 DIAGNOSIS — Z85828 Personal history of other malignant neoplasm of skin: Secondary | ICD-10-CM | POA: Diagnosis not present

## 2016-05-08 DIAGNOSIS — L218 Other seborrheic dermatitis: Secondary | ICD-10-CM | POA: Diagnosis not present

## 2016-05-08 DIAGNOSIS — L570 Actinic keratosis: Secondary | ICD-10-CM | POA: Diagnosis not present

## 2016-05-22 DIAGNOSIS — M79609 Pain in unspecified limb: Secondary | ICD-10-CM | POA: Diagnosis not present

## 2016-05-22 DIAGNOSIS — I10 Essential (primary) hypertension: Secondary | ICD-10-CM | POA: Diagnosis not present

## 2016-05-22 DIAGNOSIS — I714 Abdominal aortic aneurysm, without rupture: Secondary | ICD-10-CM | POA: Diagnosis not present

## 2016-05-22 DIAGNOSIS — E669 Obesity, unspecified: Secondary | ICD-10-CM | POA: Diagnosis not present

## 2016-05-22 DIAGNOSIS — I739 Peripheral vascular disease, unspecified: Secondary | ICD-10-CM | POA: Diagnosis not present

## 2016-05-22 DIAGNOSIS — E119 Type 2 diabetes mellitus without complications: Secondary | ICD-10-CM | POA: Diagnosis not present

## 2016-05-22 DIAGNOSIS — E785 Hyperlipidemia, unspecified: Secondary | ICD-10-CM | POA: Diagnosis not present

## 2016-06-13 DIAGNOSIS — I2510 Atherosclerotic heart disease of native coronary artery without angina pectoris: Secondary | ICD-10-CM | POA: Diagnosis not present

## 2016-06-13 DIAGNOSIS — I255 Ischemic cardiomyopathy: Secondary | ICD-10-CM | POA: Insufficient documentation

## 2016-06-13 DIAGNOSIS — R238 Other skin changes: Secondary | ICD-10-CM | POA: Diagnosis not present

## 2016-06-22 DIAGNOSIS — D0461 Carcinoma in situ of skin of right upper limb, including shoulder: Secondary | ICD-10-CM | POA: Diagnosis not present

## 2016-06-22 DIAGNOSIS — C44319 Basal cell carcinoma of skin of other parts of face: Secondary | ICD-10-CM | POA: Diagnosis not present

## 2016-08-07 ENCOUNTER — Other Ambulatory Visit: Payer: Self-pay | Admitting: Internal Medicine

## 2016-08-08 DIAGNOSIS — I714 Abdominal aortic aneurysm, without rupture: Secondary | ICD-10-CM | POA: Diagnosis not present

## 2016-08-08 DIAGNOSIS — I10 Essential (primary) hypertension: Secondary | ICD-10-CM | POA: Diagnosis not present

## 2016-08-08 DIAGNOSIS — I2510 Atherosclerotic heart disease of native coronary artery without angina pectoris: Secondary | ICD-10-CM | POA: Diagnosis not present

## 2016-08-08 DIAGNOSIS — E118 Type 2 diabetes mellitus with unspecified complications: Secondary | ICD-10-CM | POA: Diagnosis not present

## 2016-08-15 DIAGNOSIS — K219 Gastro-esophageal reflux disease without esophagitis: Secondary | ICD-10-CM | POA: Diagnosis not present

## 2016-08-15 DIAGNOSIS — I714 Abdominal aortic aneurysm, without rupture: Secondary | ICD-10-CM | POA: Diagnosis not present

## 2016-08-15 DIAGNOSIS — Z0000 Encounter for general adult medical examination without abnormal findings: Secondary | ICD-10-CM | POA: Diagnosis not present

## 2016-08-15 DIAGNOSIS — R569 Unspecified convulsions: Secondary | ICD-10-CM | POA: Diagnosis not present

## 2016-08-15 DIAGNOSIS — D692 Other nonthrombocytopenic purpura: Secondary | ICD-10-CM | POA: Insufficient documentation

## 2016-08-15 DIAGNOSIS — D649 Anemia, unspecified: Secondary | ICD-10-CM | POA: Diagnosis not present

## 2016-08-15 DIAGNOSIS — Z955 Presence of coronary angioplasty implant and graft: Secondary | ICD-10-CM | POA: Diagnosis not present

## 2016-08-15 DIAGNOSIS — D518 Other vitamin B12 deficiency anemias: Secondary | ICD-10-CM | POA: Diagnosis not present

## 2016-08-15 DIAGNOSIS — I10 Essential (primary) hypertension: Secondary | ICD-10-CM | POA: Diagnosis not present

## 2016-08-15 DIAGNOSIS — I5022 Chronic systolic (congestive) heart failure: Secondary | ICD-10-CM | POA: Diagnosis not present

## 2016-08-15 DIAGNOSIS — Z23 Encounter for immunization: Secondary | ICD-10-CM | POA: Diagnosis not present

## 2016-08-15 DIAGNOSIS — E782 Mixed hyperlipidemia: Secondary | ICD-10-CM | POA: Diagnosis not present

## 2016-08-29 ENCOUNTER — Emergency Department: Payer: Medicare HMO | Admitting: Anesthesiology

## 2016-08-29 ENCOUNTER — Emergency Department
Admission: EM | Admit: 2016-08-29 | Discharge: 2016-08-29 | Disposition: A | Discharge status: Home / Self Care | Payer: Medicare HMO | Attending: Emergency Medicine | Admitting: Emergency Medicine

## 2016-08-29 ENCOUNTER — Encounter
Admission: EM | Disposition: A | Discharge status: Home / Self Care | Payer: Self-pay | Source: Home / Self Care | Attending: Emergency Medicine

## 2016-08-29 ENCOUNTER — Encounter: Payer: Self-pay | Admitting: Emergency Medicine

## 2016-08-29 ENCOUNTER — Ambulatory Visit
Admission: RE | Admit: 2016-08-29 | Discharge: 2016-08-29 | Disposition: A | Discharge status: Home / Self Care | Payer: Medicare HMO | Source: Ambulatory Visit | Attending: Internal Medicine | Admitting: Internal Medicine

## 2016-08-29 ENCOUNTER — Other Ambulatory Visit: Payer: Self-pay | Admitting: Internal Medicine

## 2016-08-29 ENCOUNTER — Ambulatory Visit: Admit: 2016-08-29 | Payer: Medicare HMO | Admitting: Gastroenterology

## 2016-08-29 DIAGNOSIS — Z7984 Long term (current) use of oral hypoglycemic drugs: Secondary | ICD-10-CM | POA: Diagnosis not present

## 2016-08-29 DIAGNOSIS — E785 Hyperlipidemia, unspecified: Secondary | ICD-10-CM | POA: Insufficient documentation

## 2016-08-29 DIAGNOSIS — Z955 Presence of coronary angioplasty implant and graft: Secondary | ICD-10-CM | POA: Insufficient documentation

## 2016-08-29 DIAGNOSIS — Z79899 Other long term (current) drug therapy: Secondary | ICD-10-CM | POA: Diagnosis not present

## 2016-08-29 DIAGNOSIS — K222 Esophageal obstruction: Secondary | ICD-10-CM | POA: Insufficient documentation

## 2016-08-29 DIAGNOSIS — T18128A Food in esophagus causing other injury, initial encounter: Secondary | ICD-10-CM | POA: Diagnosis not present

## 2016-08-29 DIAGNOSIS — Z7982 Long term (current) use of aspirin: Secondary | ICD-10-CM | POA: Insufficient documentation

## 2016-08-29 DIAGNOSIS — R1312 Dysphagia, oropharyngeal phase: Secondary | ICD-10-CM | POA: Diagnosis not present

## 2016-08-29 DIAGNOSIS — D649 Anemia, unspecified: Secondary | ICD-10-CM | POA: Diagnosis not present

## 2016-08-29 DIAGNOSIS — Z7902 Long term (current) use of antithrombotics/antiplatelets: Secondary | ICD-10-CM | POA: Insufficient documentation

## 2016-08-29 DIAGNOSIS — I10 Essential (primary) hypertension: Secondary | ICD-10-CM | POA: Diagnosis not present

## 2016-08-29 DIAGNOSIS — I252 Old myocardial infarction: Secondary | ICD-10-CM | POA: Diagnosis not present

## 2016-08-29 DIAGNOSIS — I739 Peripheral vascular disease, unspecified: Secondary | ICD-10-CM | POA: Diagnosis not present

## 2016-08-29 DIAGNOSIS — I2510 Atherosclerotic heart disease of native coronary artery without angina pectoris: Secondary | ICD-10-CM | POA: Insufficient documentation

## 2016-08-29 DIAGNOSIS — Z87891 Personal history of nicotine dependence: Secondary | ICD-10-CM | POA: Insufficient documentation

## 2016-08-29 DIAGNOSIS — E1140 Type 2 diabetes mellitus with diabetic neuropathy, unspecified: Secondary | ICD-10-CM | POA: Diagnosis not present

## 2016-08-29 DIAGNOSIS — K219 Gastro-esophageal reflux disease without esophagitis: Secondary | ICD-10-CM | POA: Insufficient documentation

## 2016-08-29 DIAGNOSIS — R1310 Dysphagia, unspecified: Secondary | ICD-10-CM

## 2016-08-29 DIAGNOSIS — I255 Ischemic cardiomyopathy: Secondary | ICD-10-CM | POA: Diagnosis not present

## 2016-08-29 DIAGNOSIS — X58XXXA Exposure to other specified factors, initial encounter: Secondary | ICD-10-CM | POA: Insufficient documentation

## 2016-08-29 DIAGNOSIS — T18108A Unspecified foreign body in esophagus causing other injury, initial encounter: Secondary | ICD-10-CM | POA: Diagnosis not present

## 2016-08-29 DIAGNOSIS — Z85828 Personal history of other malignant neoplasm of skin: Secondary | ICD-10-CM | POA: Insufficient documentation

## 2016-08-29 HISTORY — PX: ESOPHAGOGASTRODUODENOSCOPY: SHX5428

## 2016-08-29 LAB — CBC
HEMATOCRIT: 39.6 % — AB (ref 40.0–52.0)
HEMOGLOBIN: 12.9 g/dL — AB (ref 13.0–18.0)
MCH: 26 pg (ref 26.0–34.0)
MCHC: 32.5 g/dL (ref 32.0–36.0)
MCV: 79.9 fL — AB (ref 80.0–100.0)
Platelets: 227 10*3/uL (ref 150–440)
RBC: 4.95 MIL/uL (ref 4.40–5.90)
RDW: 16.1 % — ABNORMAL HIGH (ref 11.5–14.5)
WBC: 7.9 10*3/uL (ref 3.8–10.6)

## 2016-08-29 LAB — COMPREHENSIVE METABOLIC PANEL
ALBUMIN: 5 g/dL (ref 3.5–5.0)
ALT: 14 U/L — AB (ref 17–63)
AST: 34 U/L (ref 15–41)
Alkaline Phosphatase: 69 U/L (ref 38–126)
Anion gap: 10 (ref 5–15)
BILIRUBIN TOTAL: 1.3 mg/dL — AB (ref 0.3–1.2)
BUN: 27 mg/dL — AB (ref 6–20)
CO2: 25 mmol/L (ref 22–32)
CREATININE: 1.31 mg/dL — AB (ref 0.61–1.24)
Calcium: 9.9 mg/dL (ref 8.9–10.3)
Chloride: 106 mmol/L (ref 101–111)
GFR calc Af Amer: 58 mL/min — ABNORMAL LOW (ref >60)
GFR, EST NON AFRICAN AMERICAN: 50 mL/min — AB (ref >60)
GLUCOSE: 124 mg/dL — AB (ref 65–99)
POTASSIUM: 3.9 mmol/L (ref 3.5–5.1)
Sodium: 141 mmol/L (ref 135–145)
TOTAL PROTEIN: 8.4 g/dL — AB (ref 6.5–8.1)

## 2016-08-29 LAB — PROTIME-INR
INR: 1.11
PROTHROMBIN TIME: 14.4 s (ref 11.4–15.2)

## 2016-08-29 SURGERY — EGD (ESOPHAGOGASTRODUODENOSCOPY)
Anesthesia: General

## 2016-08-29 MED ORDER — FENTANYL CITRATE (PF) 100 MCG/2ML IJ SOLN
25.0000 ug | INTRAMUSCULAR | Status: DC | PRN
  Administered 2016-08-29: 25 ug via INTRAVENOUS

## 2016-08-29 MED ORDER — PROPOFOL 10 MG/ML IV BOLUS
INTRAVENOUS | Status: AC
Start: 2016-08-29 — End: 2016-08-29
  Filled 2016-08-29: qty 20

## 2016-08-29 MED ORDER — EPHEDRINE SULFATE 50 MG/ML IJ SOLN
INTRAMUSCULAR | Status: DC | PRN
  Administered 2016-08-29: 5 mg via INTRAVENOUS

## 2016-08-29 MED ORDER — HYDROMORPHONE HCL 1 MG/ML IJ SOLN
INTRAMUSCULAR | Status: AC
  Filled 2016-08-29: qty 1

## 2016-08-29 MED ORDER — ONDANSETRON HCL 4 MG/2ML IJ SOLN: 4.0000 mg | Freq: Once | INTRAMUSCULAR | Status: DC | PRN

## 2016-08-29 MED ORDER — LIDOCAINE HCL (CARDIAC) 20 MG/ML IV SOLN
INTRAVENOUS | Status: DC | PRN
  Administered 2016-08-29: 50 mg via INTRAVENOUS

## 2016-08-29 MED ORDER — FENTANYL CITRATE (PF) 100 MCG/2ML IJ SOLN
INTRAMUSCULAR | Status: AC
  Filled 2016-08-29: qty 2

## 2016-08-29 MED ORDER — ETOMIDATE 2 MG/ML IV SOLN
INTRAVENOUS | Status: AC
  Filled 2016-08-29: qty 10

## 2016-08-29 MED ORDER — SUCCINYLCHOLINE CHLORIDE 200 MG/10ML IV SOSY
PREFILLED_SYRINGE | INTRAVENOUS | Status: AC
  Filled 2016-08-29: qty 10

## 2016-08-29 MED ORDER — SUCCINYLCHOLINE CHLORIDE 20 MG/ML IJ SOLN
INTRAMUSCULAR | Status: DC | PRN
  Administered 2016-08-29: 100 mg via INTRAVENOUS

## 2016-08-29 MED ORDER — PROPOFOL 10 MG/ML IV BOLUS
INTRAVENOUS | Status: DC | PRN
  Administered 2016-08-29: 120 mg via INTRAVENOUS

## 2016-08-29 MED ORDER — SEVOFLURANE IN SOLN
RESPIRATORY_TRACT | Status: AC
  Filled 2016-08-29: qty 250

## 2016-08-29 MED ORDER — SODIUM CHLORIDE 0.9 % IV BOLUS (SEPSIS)
1000.0000 mL | Freq: Once | INTRAVENOUS | Status: AC
  Administered 2016-08-29: 1000 mL via INTRAVENOUS

## 2016-08-29 MED ORDER — SODIUM CHLORIDE 0.9 % IV SOLN
INTRAVENOUS | Status: DC | PRN
  Administered 2016-08-29: 20:00:00 via INTRAVENOUS

## 2016-08-29 SURGICAL SUPPLY — 3 items
BIOPSY FORCEP ×3 IMPLANT
RETRIEVEL NET ×2 IMPLANT
ROTH NET ×2 IMPLANT

## 2016-08-29 NOTE — ED Provider Notes (Signed)
[facility] Emergency Department Provider Note  Time seen: 6:40 PM  I have reviewed the triage vital signs and the nursing notes.   HISTORY  Chief Complaint Foreign Body    HPI Nicholas Parker is a 83 y.o. male with a past medical history of abdominal aortic aneurysm, diabetes, gastric reflux, hypertension, hyperlipidemia, presents to the emergency department for likely esophageal obstruction. According to the patient since Saturday he has not been able to swallow or keep down anything to eat or drink. Patient was sent for a barium swallow by Dr. Gustavo Lah today which shows a likely obstruction. Patient sent to the emergency department for further evaluation and likely GI intervention. Patient denies any pain now or at any time. States he has not been able to swallow secretions or fluids for the past 4 days. We will check labs, IV hydrate and discussed with Dr. Gustavo Lah.  Past Medical History:  Diagnosis Date  . AAA (abdominal aortic aneurysm) ([place])   . Adenocarcinoma ([place])   . Anemia   . B12 deficiency   . Cardiomyopathy, ischemic 10/18/2015  . Coronary artery disease   . Diabetes mellitus without complication ([place])   . Diverticulosis   . GERD (gastroesophageal reflux disease)   . Hyperlipidemia   . Hypertension   . Low testosterone   . Myocardial infarction 10/12/2015  . neuropathy   . Neuropathy ([place])   . Skin cancer    Basal Cell  . Ventricular tachycardia, sustained ([place]) 10/18/2015    Patient Active Problem List   Diagnosis Date Noted  . Abdominal aortic aneurysm (AAA) ([place]) 10/18/2015  . Absolute anemia 10/18/2015  . Vitamin B12 deficiency anemia 10/18/2015  . Arteriosclerosis of coronary artery 10/18/2015  . Acid reflux 10/18/2015  . Hypotestosteronism 10/18/2015  . Neuropathy ([place]) 10/18/2015  . Ventricular tachycardia, sustained ([place]) 10/18/2015  . Cardiomyopathy, ischemic 10/18/2015  . Myocardial infarction 10/12/2015  . Abdominal abscess  ([place]) 10/11/2015  . Presence of stent in coronary artery 10/10/2015  . Tubular adenoma of colon 10/10/2015  . Seizure ([place])   . Hypoglycemia 10/07/2015  . Renal failure 09/24/2015  . Acute ST elevation myocardial infarction (STEMI) involving right coronary artery ([place])   . Colon polyp 09/14/2015  . Abnormal bruising 08/12/2015  . Type 2 diabetes mellitus ([place]) 08/11/2015  . Benign essential HTN 02/04/2015  . Combined fat and carbohydrate induced hyperlipemia 02/04/2015    Past Surgical History:  Procedure Laterality Date  . ABDOMINAL AORTIC ANEURYSM REPAIR  2014   Dr. Delana Meyer, [facility]  . arthrosopic rotator cuff repair Left   . CARDIAC CATHETERIZATION N/A 09/17/2015   Procedure: Left Heart Cath and Coronary Angiography;  Surgeon: Wellington Hampshire, MD;  Location: [facility] CV LAB;  Service: Cardiovascular;  Laterality: N/A;  . CARDIAC CATHETERIZATION N/A 09/17/2015   Procedure: Coronary Stent Intervention;  Surgeon: Wellington Hampshire, MD;  Location: [facility] LAB;  Service: Cardiovascular;  Laterality: N/A;  . COLON SURGERY    . COLONOSCOPY WITH PROPOFOL N/A 04/13/2015   Procedure: COLONOSCOPY WITH PROPOFOL;  Surgeon: Lollie Sails, MD;  Location: [facility] ENDOSCOPY;  Service: Endoscopy;  Laterality: N/A;  . COLONOSCOPY WITH PROPOFOL N/A 04/14/2015   Procedure: COLONOSCOPY WITH PROPOFOL;  Surgeon: Lollie Sails, MD;  Location: [facility] ENDOSCOPY;  Service: Endoscopy;  Laterality: N/A;  . COLONOSCOPY WITH PROPOFOL N/A 07/20/2015   Procedure: COLONOSCOPY WITH PROPOFOL;  Surgeon: Lollie Sails, MD;  Location: [facility] ENDOSCOPY;  Service: Endoscopy;  Laterality: N/A;  . CORONARY  ANGIOPLASTY    . EYE SURGERY Bilateral    Cataract Extraction with IOL  . LAPAROSCOPIC RIGHT COLECTOMY Right 09/14/2015   Procedure: LAPAROSCOPIC RIGHT COLECTOMY  CONVERTED TO OPEN RIGHT COLECTOMY, LYSIS OF ADHESIONS;  Surgeon: Leonie Green, MD;  Location: [facility] ORS;  Service: General;  Laterality:  Right;  . robotic colectomy    . VASCULAR SURGERY      Prior to Admission medications   Medication Sig Start Date End Date Taking? Authorizing Provider  amiodarone (PACERONE) 200 MG tablet Take 1 tablet (200 mg total) by mouth daily. 10/18/15   Deboraha Sprang, MD  ampicillin (PRINCIPEN) 500 MG capsule Take 1 capsule (500 mg total) by mouth 4 (four) times daily. 10/08/15   Max Sane, MD  aspirin EC 81 MG tablet Take 81 mg by mouth daily.    Historical Provider, MD  clopidogrel (PLAVIX) 75 MG tablet Take 1 tablet (75 mg total) by mouth daily with breakfast. 09/21/15   Leonie Green, MD  furosemide (LASIX) 20 MG tablet Take 1 tablet (20 mg total) by mouth 2 (two) times daily. 10/04/15   Epifanio Lesches, MD  lisinopril (PRINIVIL,ZESTRIL) 5 MG tablet Take 5 mg by mouth daily.    Historical Provider, MD  loratadine (CLARITIN) 10 MG tablet Take 10 mg by mouth daily.    Historical Provider, MD  metFORMIN (GLUCOPHAGE) 500 MG tablet Take 500 mg by mouth 2 (two) times daily with a meal.     Historical Provider, MD  metoprolol succinate (TOPROL-XL) 25 MG 24 hr tablet Take 25 mg by mouth daily.    Historical Provider, MD  oxyCODONE-acetaminophen (PERCOCET/ROXICET) 5-325 MG tablet Take 1 tablet by mouth every 4 (four) hours as needed for severe pain.    Historical Provider, MD  pantoprazole (PROTONIX) 40 MG tablet Take 40 mg by mouth daily.     Historical Provider, MD  simvastatin (ZOCOR) 40 MG tablet Take 40 mg by mouth at bedtime.     Historical Provider, MD  vitamin B-12 (CYANOCOBALAMIN) 500 MCG tablet Take 500 mcg by mouth daily.    Historical Provider, MD    Allergies  Allergen Reactions  . Lipitor [Atorvastatin] Other (See Comments)    Reaction:  Leg cramps   . Pravachol [Pravastatin Sodium] Other (See Comments)    Reaction:  Leg cramps  . Riomet [Metformin Hcl] Diarrhea  . Vytorin [Ezetimibe-Simvastatin] Other (See Comments)    Reaction:  Leg cramps    Family History  Problem  Relation Age of Onset  . CAD Mother   . Diabetes type II Mother   . Heart attack Mother   . Lung cancer Father   . Glaucoma Other     Social History Social History  Substance Use Topics  . Smoking status: Former Smoker    Packs/day: 1.50    Types: Cigarettes    Quit date: 10/03/2003  . Smokeless tobacco: Never Used  . Alcohol use 0.6 oz/week    1 Cans of beer per week    Review of Systems Constitutional: Negative for fever. Cardiovascular: Negative for chest pain. Respiratory: Negative for shortness of breath. Gastrointestinal: Negative for abdominal pain. Positive for vomiting after attempting to swallow. Neurological: Negative for headache 10-point ROS otherwise negative.  ____________________________________________   PHYSICAL EXAM:  VITAL SIGNS: ED Triage Vitals [08/29/16 1834]  Enc Vitals Group     BP (!) 145/55     Pulse Rate 74     Resp 17     Temp  Temp src      SpO2 98 %     Weight 157 lb (71.2 kg)     Height 6' (1.829 m)     Head Circumference      Peak Flow      Pain Score      Pain Loc      Pain Edu?      Excl. in [place]?     Constitutional: Alert and oriented. Well appearing and in no distress. Eyes: Normal exam ENT   Head: Normocephalic and atraumatic.   Mouth/Throat: Mucous membranes are moist. Cardiovascular: Normal rate, regular rhythm. No murmur Respiratory: Normal respiratory effort without tachypnea nor retractions. Breath sounds are clear  Gastrointestinal: Soft and nontender. No distention. Musculoskeletal: Nontender with normal range of motion in all extremities.  Neurologic:  Normal speech and language. No gross focal neurologic deficits  Skin:  Skin is warm, dry and intact.  Psychiatric: Mood and affect are normal.  ____________________________________________    EKG  EKG reviewed and interpreted by myself shows sinus rhythm 82 bpm, slightly widened QRS, normal axis, normal intervals besides a prolonged QTC at 521 ms,  nonspecific ST changes however largely unchanged from 10/18/15.  ____________________________________________   INITIAL IMPRESSION / ASSESSMENT AND PLAN / ED COURSE  Pertinent labs & imaging results that were available during my care of the patient were reviewed by me and considered in my medical decision making (see chart for details).  The patient presents the emergency department with a likely esophageal obstruction. Patient states he has had food get stuck before however it usually lasts several minutes where he vomits and then is able to swallow again. He states this is the first time that he has had food get stuck that has not cleared on its own. Has never had an endoscopy for an obstruction in the past. We will check labs, IV hydrate and discussed with GI medicine once results are known. Currently the patient appears very well, no distress, resting comfortably in bed.  Labs are largely within normal limits. I discussed the patient with Dr.Skulskie. The patient will be taken to the GI suite shortly for endoscopy.  ____________________________________________   FINAL CLINICAL IMPRESSION(S) / ED DIAGNOSES  Esophageal obstruction    Harvest Dark, MD 08/29/16 1932

## 2016-08-29 NOTE — Op Note (Signed)
[facility] Gastroenterology Patient Name: Nicholas Parker Procedure Date: 08/29/2016 7:39 PM MRN: [id number] Account #: [id number] Date of Birth: 12/02/1933 Admit Type: Outpatient Age: 83 Room: [facility] ENDO ROOM 1 Gender: Male Note Status: Finalized Procedure:            Upper GI endoscopy Indications:          Foreign body in the esophagus Providers:            Lollie Sails, MD Referring MD:         No Local Md, MD (Referring MD) Medicines:            Monitored Anesthesia Care Complications:        No immediate complications. Procedure:            Pre-Anesthesia Assessment:                       - ASA Grade Assessment: III - A patient with severe                        systemic disease.                       After obtaining informed consent, the endoscope was                        passed under direct vision. Throughout the procedure,                        the patient's blood pressure, pulse, and oxygen                        saturations were monitored continuously. The Endoscope                        was introduced through the mouth, and advanced to the                        duodenal bulb. The upper GI endoscopy was accomplished                        without difficulty. The patient tolerated the procedure                        well. Findings:      Food was found in the lower third of the esophagus. Removal of food was       accomplished with use of a Roth net. The lower esophagus, lower third       was noted to be quite edematous, with ulceration at the spot where the       foriegn body was found. I was unable to pass the adult endoscope through       the GE junction, however switched to a pediatric gastroscope, and       advanced into the duodenal bulb without difficulty. A limited evaluation       of the stomach with this scope showed no notable lesions, and the the       duodenal bulb was normal in appearance otherwise. Impression:           - Food in  the lower third of the esophagus. Removal was  successful. Recommendation:       - Discharge patient to home.                       - Use Protonix (pantoprazole) 40 mg PO BID for 3 weeks.                       - Use Protonix (pantoprazole) 40 mg PO daily daily.                       - Clear liquid diet for 3 days, then advance as                        tolerated to full liquid diet for 4 days. May then                        advance to soft /pureed diet until follow up endoscopy                        in 3-4 weeks.                       - Return to GI clinic in 1 week. Procedure Code(s):    --- Professional ---                       [phone], Esophagogastroduodenoscopy, flexible, transoral;                        with removal of foreign body(s) Diagnosis Code(s):    --- Professional ---                       W03.794C, Food in esophagus causing other injury,                        initial encounter                       T18.108A, Unspecified foreign body in esophagus causing                        other injury, initial encounter CPT copyright 2016 American Medical Association. All rights reserved. The codes documented in this report are preliminary and upon coder review may  be revised to meet current compliance requirements. Lollie Sails, MD 08/29/2016 8:34:05 PM This report has been signed electronically. Number of Addenda: 0 Note Initiated On: 08/29/2016 7:39 PM      [facility]

## 2016-08-29 NOTE — Consult Note (Signed)
Outpatient short stay form Pre-procedure 08/29/2016 7:13 PM Nicholas Sails MD  Primary Physician: Dr. Lisette Grinder  Reason for visit:  Esophageal foreign body  History of present illness:  Patient is a 83 year old male who went to his primary care physician's office earlier today complaining of inability to tolerate any by mouth intake. He states over the past 4 days he was only able take sips of water and Gatorade. He was sent to radiology department [facility] and had a partial barium swallow done. This indicated a possible mass versus foreign body in the distal esophagus. He does report that he has not been a bleeding thing solid since 08/26/2016. He was eating a barbecue pork at that time when the symptoms began. He states he has not been able take any of his medications. He was seen also in the GI office after result of the barium swallow was no states he feels very weak and has lost 10 pounds. We will regurgitate water when he takes multiple cysts. He has had intermittent episodes of dysphagia to solid foods for the past 6 months. Recall a he does regurgitate foods. He does not have any reflux symptoms. He is not on any blood thinners and has not been able take his 81 mg aspirin for several days. He had a myocardial infarction March 2017 was on Plavix for some period of time after stent placement at that time however has not been on that for several months.    Current Facility-Administered Medications:  .  sodium chloride 0.9 % bolus 1,000 mL, 1,000 mL, Intravenous, Once, Harvest Dark, MD  Current Outpatient Prescriptions:  .  amiodarone (PACERONE) 200 MG tablet, Take 1 tablet (200 mg total) by mouth daily., Disp: 90 tablet, Rfl: 3 .  ampicillin (PRINCIPEN) 500 MG capsule, Take 1 capsule (500 mg total) by mouth 4 (four) times daily., Disp: 28 capsule, Rfl: 0 .  aspirin EC 81 MG tablet, Take 81 mg by mouth daily., Disp: , Rfl:  .  clopidogrel (PLAVIX) 75 MG tablet,  Take 1 tablet (75 mg total) by mouth daily with breakfast., Disp: 30 tablet, Rfl: 11 .  furosemide (LASIX) 20 MG tablet, Take 1 tablet (20 mg total) by mouth 2 (two) times daily., Disp: 4 tablet, Rfl: 0 .  lisinopril (PRINIVIL,ZESTRIL) 5 MG tablet, Take 5 mg by mouth daily., Disp: , Rfl:  .  loratadine (CLARITIN) 10 MG tablet, Take 10 mg by mouth daily., Disp: , Rfl:  .  metFORMIN (GLUCOPHAGE) 500 MG tablet, Take 500 mg by mouth 2 (two) times daily with a meal. , Disp: , Rfl:  .  metoprolol succinate (TOPROL-XL) 25 MG 24 hr tablet, Take 25 mg by mouth daily., Disp: , Rfl:  .  oxyCODONE-acetaminophen (PERCOCET/ROXICET) 5-325 MG tablet, Take 1 tablet by mouth every 4 (four) hours as needed for severe pain., Disp: , Rfl:  .  pantoprazole (PROTONIX) 40 MG tablet, Take 40 mg by mouth daily. , Disp: , Rfl:  .  simvastatin (ZOCOR) 40 MG tablet, Take 40 mg by mouth at bedtime. , Disp: , Rfl:  .  vitamin B-12 (CYANOCOBALAMIN) 500 MCG tablet, Take 500 mcg by mouth daily., Disp: , Rfl:    (Not in a hospital admission)   Allergies  Allergen Reactions  . Lipitor [Atorvastatin] Other (See Comments)    Reaction:  Leg cramps   . Pravachol [Pravastatin Sodium] Other (See Comments)    Reaction:  Leg cramps  . Riomet [Metformin Hcl] Diarrhea  .  Vytorin [Ezetimibe-Simvastatin] Other (See Comments)    Reaction:  Leg cramps     Past Medical History:  Diagnosis Date  . AAA (abdominal aortic aneurysm) ([place])   . Adenocarcinoma ([place])   . Anemia   . B12 deficiency   . Cardiomyopathy, ischemic 10/18/2015  . Coronary artery disease   . Diabetes mellitus without complication ([place])   . Diverticulosis   . GERD (gastroesophageal reflux disease)   . Hyperlipidemia   . Hypertension   . Low testosterone   . Myocardial infarction 10/12/2015  . neuropathy   . Neuropathy ([place])   . Skin cancer    Basal Cell  . Ventricular tachycardia, sustained ([place]) 10/18/2015    Review of systems:      Physical Exam     Heart and lungs: Irregular rhythm no rub or gallop. Bilaterally clear to auscultation    HEENT: Normocephalic atraumatic eyes are anicteric    Other:     Pertinant exam for procedure: Soft nontender nondistended bowel sounds positive normoactive.  Assessment 1 esophageal obstruction, likely due to food bolus versus other reasons such as neoplasia. Had progressive dysphagia for at least 6 months.  Recommendation #1 we'll proceed with EGD once labs are known. I have discussed the risks benefits and complications of procedures to include not limited to bleeding, infection, perforation and the risk of sedation and the patient wishes to proceed.  It is of note that patient has a cardiac history and has not been able to this medication for several days. He also takes metoprolol.     Nicholas Sails, MD Gastroenterology 08/29/2016  7:13 PM

## 2016-08-29 NOTE — Anesthesia Procedure Notes (Signed)
Procedure Name: Intubation Date/Time: 08/29/2016 7:56 PM Performed by: Lendon Colonel Pre-anesthesia Checklist: Patient identified, Emergency Drugs available, Suction available, Patient being monitored and Timeout performed Patient Re-evaluated:Patient Re-evaluated prior to inductionOxygen Delivery Method: Circle system utilized Preoxygenation: Pre-oxygenation with 100% oxygen Intubation Type: IV induction, Rapid sequence and Cricoid Pressure applied Laryngoscope Size: Mac and 3 Grade View: Grade II Tube type: Oral Number of attempts: 1 Airway Equipment and Method: Stylet Placement Confirmation: ETT inserted through vocal cords under direct vision,  positive ETCO2 and breath sounds checked- equal and bilateral Secured at: 20 cm Tube secured with: Tape Dental Injury: Teeth and Oropharynx as per pre-operative assessment

## 2016-08-29 NOTE — Anesthesia Preprocedure Evaluation (Signed)
Anesthesia Evaluation  Patient identified by MRN, date of birth, ID band Patient awake    Reviewed: Allergy & Precautions, NPO status , Patient's Chart, lab work & pertinent test results, reviewed documented beta blocker date and time   Airway Mallampati: II  TM Distance: >3 FB     Dental  (+) Chipped   Pulmonary former smoker,           Cardiovascular hypertension, Pt. on medications and Pt. on home beta blockers + CAD, + Past MI, + Cardiac Stents and + Peripheral Vascular Disease  + dysrhythmias Ventricular Tachycardia      Neuro/Psych Seizures -, Well Controlled,   Neuromuscular disease    GI/Hepatic GERD  ,  Endo/Other  diabetes, Type 2  Renal/GU Renal InsufficiencyRenal disease     Musculoskeletal   Abdominal   Peds  Hematology  (+) anemia ,   Anesthesia Other Findings   Reproductive/Obstetrics                             Anesthesia Physical Anesthesia Plan  ASA: III  Anesthesia Plan: General   Post-op Pain Management:    Induction: Intravenous  Airway Management Planned: Oral ETT  Additional Equipment:   Intra-op Plan:   Post-operative Plan:   Informed Consent: I have reviewed the patients History and Physical, chart, labs and discussed the procedure including the risks, benefits and alternatives for the proposed anesthesia with the patient or authorized representative who has indicated his/her understanding and acceptance.     Plan Discussed with: CRNA  Anesthesia Plan Comments:         Anesthesia Quick Evaluation

## 2016-08-29 NOTE — ED Triage Notes (Signed)
Patient presents to ED via POV. Patient states on Saturday pulled pork got caught in his throat. Patient has not had anything to eat or drink since then. Patient has lost 10 pounds since then. Patient states he is unable to swallow saliva. Patient A&O x4. No respiratory distress noted.

## 2016-08-29 NOTE — Discharge Instructions (Signed)
Follow Dr. Marton Redwood instruction sheet.   Clear liquid diet, Full liquid diet, & Soft/ Pureed diet instruction sheets given to Patient and Family.

## 2016-08-30 ENCOUNTER — Encounter: Payer: Self-pay | Admitting: Gastroenterology

## 2016-08-30 NOTE — Transfer of Care (Signed)
Immediate Anesthesia Transfer of Care Note  Patient: Nicholas Parker  Procedure(s) Performed: Procedure(s): ESOPHAGOGASTRODUODENOSCOPY (EGD) with removal of foreign body (N/A)  Patient Location: PACU  Anesthesia Type:General  Level of Consciousness: awake, alert , oriented and patient cooperative  Airway & Oxygen Therapy: Patient Spontanous Breathing and Patient connected to face mask oxygen  Post-op Assessment: Report given to RN and Post -op Vital signs reviewed and stable  Post vital signs: Reviewed and stable  Last Vitals:  Vitals:   08/29/16 2125 08/29/16 2130  BP: 137/67 138/70  Pulse: 76 74  Resp: (!) 28 (!) 26  Temp: 36.6 C     Last Pain: There were no vitals filed for this visit.       Complications: No apparent anesthesia complications

## 2016-08-30 NOTE — Anesthesia Postprocedure Evaluation (Signed)
Anesthesia Post Note  Patient: Nicholas Parker  Procedure(s) Performed: Procedure(s) (LRB): ESOPHAGOGASTRODUODENOSCOPY (EGD) with removal of foreign body (N/A)  Patient location during evaluation: Endoscopy Anesthesia Type: General Level of consciousness: awake and alert Pain management: pain level controlled Vital Signs Assessment: post-procedure vital signs reviewed and stable Respiratory status: spontaneous breathing, nonlabored ventilation, respiratory function stable and patient connected to nasal cannula oxygen Cardiovascular status: blood pressure returned to baseline and stable Postop Assessment: no signs of nausea or vomiting Anesthetic complications: no     Last Vitals:  Vitals:   08/29/16 2125 08/29/16 2130  BP: 137/67 138/70  Pulse: 76 74  Resp: (!) 28 (!) 26  Temp: 36.6 C     Last Pain: There were no vitals filed for this visit.               Gaile Allmon S

## 2016-08-30 NOTE — Anesthesia Postprocedure Evaluation (Signed)
Anesthesia Post Note  Patient: Nicholas Parker  Procedure(s) Performed: Procedure(s) (LRB): ESOPHAGOGASTRODUODENOSCOPY (EGD) with removal of foreign body (N/A)  Anesthesia Type: General     Last Vitals:  Vitals:   08/29/16 2125 08/29/16 2130  BP: 137/67 138/70  Pulse: 76 74  Resp: (!) 28 (!) 26  Temp: 36.6 C     Last Pain: There were no vitals filed for this visit.               Dillyn Joaquin,  Angelina Pih

## 2016-09-06 ENCOUNTER — Other Ambulatory Visit: Payer: Self-pay | Admitting: Gastroenterology

## 2016-09-06 DIAGNOSIS — K224 Dyskinesia of esophagus: Secondary | ICD-10-CM

## 2016-09-20 ENCOUNTER — Ambulatory Visit
Admission: RE | Admit: 2016-09-20 | Discharge: 2016-09-20 | Disposition: A | Discharge status: Home / Self Care | Payer: Medicare HMO | Source: Ambulatory Visit | Attending: Gastroenterology | Admitting: Gastroenterology

## 2016-09-20 DIAGNOSIS — J9809 Other diseases of bronchus, not elsewhere classified: Secondary | ICD-10-CM | POA: Diagnosis not present

## 2016-09-20 DIAGNOSIS — I2510 Atherosclerotic heart disease of native coronary artery without angina pectoris: Secondary | ICD-10-CM | POA: Insufficient documentation

## 2016-09-20 DIAGNOSIS — K224 Dyskinesia of esophagus: Secondary | ICD-10-CM | POA: Diagnosis not present

## 2016-09-20 DIAGNOSIS — I700 Atherosclerosis of aorta: Secondary | ICD-10-CM | POA: Diagnosis not present

## 2016-09-20 DIAGNOSIS — J439 Emphysema, unspecified: Secondary | ICD-10-CM | POA: Insufficient documentation

## 2016-09-20 DIAGNOSIS — R935 Abnormal findings on diagnostic imaging of other abdominal regions, including retroperitoneum: Secondary | ICD-10-CM | POA: Diagnosis not present

## 2016-09-20 MED ORDER — IOPAMIDOL (ISOVUE-300) INJECTION 61%
100.0000 mL | Freq: Once | INTRAVENOUS | Status: AC | PRN
  Administered 2016-09-20: 100 mL via INTRAVENOUS

## 2016-09-27 DIAGNOSIS — D2272 Melanocytic nevi of left lower limb, including hip: Secondary | ICD-10-CM | POA: Diagnosis not present

## 2016-09-27 DIAGNOSIS — D225 Melanocytic nevi of trunk: Secondary | ICD-10-CM | POA: Diagnosis not present

## 2016-09-27 DIAGNOSIS — Z85828 Personal history of other malignant neoplasm of skin: Secondary | ICD-10-CM | POA: Diagnosis not present

## 2016-09-27 DIAGNOSIS — D2261 Melanocytic nevi of right upper limb, including shoulder: Secondary | ICD-10-CM | POA: Diagnosis not present

## 2016-09-28 ENCOUNTER — Encounter: Payer: Self-pay | Admitting: *Deleted

## 2016-09-29 ENCOUNTER — Ambulatory Visit
Admission: RE | Admit: 2016-09-29 | Discharge: 2016-09-29 | Disposition: A | Discharge status: Home / Self Care | Payer: Medicare HMO | Source: Ambulatory Visit | Attending: Gastroenterology | Admitting: Gastroenterology

## 2016-09-29 ENCOUNTER — Ambulatory Visit: Payer: Medicare HMO | Admitting: Anesthesiology

## 2016-09-29 ENCOUNTER — Encounter
Admission: RE | Disposition: A | Discharge status: Home / Self Care | Payer: Self-pay | Source: Ambulatory Visit | Attending: Gastroenterology

## 2016-09-29 ENCOUNTER — Encounter: Payer: Self-pay | Admitting: *Deleted

## 2016-09-29 DIAGNOSIS — I2510 Atherosclerotic heart disease of native coronary artery without angina pectoris: Secondary | ICD-10-CM | POA: Insufficient documentation

## 2016-09-29 DIAGNOSIS — I252 Old myocardial infarction: Secondary | ICD-10-CM | POA: Diagnosis not present

## 2016-09-29 DIAGNOSIS — Z85828 Personal history of other malignant neoplasm of skin: Secondary | ICD-10-CM | POA: Insufficient documentation

## 2016-09-29 DIAGNOSIS — I10 Essential (primary) hypertension: Secondary | ICD-10-CM | POA: Diagnosis not present

## 2016-09-29 DIAGNOSIS — Z7984 Long term (current) use of oral hypoglycemic drugs: Secondary | ICD-10-CM | POA: Insufficient documentation

## 2016-09-29 DIAGNOSIS — E1140 Type 2 diabetes mellitus with diabetic neuropathy, unspecified: Secondary | ICD-10-CM | POA: Diagnosis not present

## 2016-09-29 DIAGNOSIS — K219 Gastro-esophageal reflux disease without esophagitis: Secondary | ICD-10-CM | POA: Diagnosis not present

## 2016-09-29 DIAGNOSIS — I255 Ischemic cardiomyopathy: Secondary | ICD-10-CM | POA: Insufficient documentation

## 2016-09-29 DIAGNOSIS — R0602 Shortness of breath: Secondary | ICD-10-CM | POA: Diagnosis not present

## 2016-09-29 DIAGNOSIS — E785 Hyperlipidemia, unspecified: Secondary | ICD-10-CM | POA: Insufficient documentation

## 2016-09-29 DIAGNOSIS — K5790 Diverticulosis of intestine, part unspecified, without perforation or abscess without bleeding: Secondary | ICD-10-CM | POA: Diagnosis not present

## 2016-09-29 DIAGNOSIS — K228 Other specified diseases of esophagus: Secondary | ICD-10-CM | POA: Diagnosis not present

## 2016-09-29 DIAGNOSIS — R1310 Dysphagia, unspecified: Secondary | ICD-10-CM | POA: Insufficient documentation

## 2016-09-29 DIAGNOSIS — Z538 Procedure and treatment not carried out for other reasons: Secondary | ICD-10-CM | POA: Diagnosis not present

## 2016-09-29 DIAGNOSIS — Z7982 Long term (current) use of aspirin: Secondary | ICD-10-CM | POA: Diagnosis not present

## 2016-09-29 DIAGNOSIS — Z87891 Personal history of nicotine dependence: Secondary | ICD-10-CM | POA: Diagnosis not present

## 2016-09-29 HISTORY — DX: Dyspnea, unspecified: R06.00

## 2016-09-29 HISTORY — PX: ESOPHAGOGASTRODUODENOSCOPY (EGD) WITH PROPOFOL: SHX5813

## 2016-09-29 LAB — GLUCOSE, CAPILLARY: GLUCOSE-CAPILLARY: 104 mg/dL — AB (ref 65–99)

## 2016-09-29 SURGERY — ESOPHAGOGASTRODUODENOSCOPY (EGD) WITH PROPOFOL
Anesthesia: General

## 2016-09-29 MED ORDER — PROPOFOL 500 MG/50ML IV EMUL
INTRAVENOUS | Status: DC | PRN
Start: 2016-09-29 — End: 2016-09-29
  Administered 2016-09-29: 150 ug/kg/min via INTRAVENOUS

## 2016-09-29 MED ORDER — LIDOCAINE HCL (CARDIAC) 20 MG/ML IV SOLN
INTRAVENOUS | Status: DC | PRN
  Administered 2016-09-29: 80 mg via INTRAVENOUS

## 2016-09-29 MED ORDER — EPHEDRINE SULFATE 50 MG/ML IJ SOLN
INTRAMUSCULAR | Status: DC | PRN
  Administered 2016-09-29: 10 mg via INTRAVENOUS

## 2016-09-29 MED ORDER — EPHEDRINE 5 MG/ML INJ
INTRAVENOUS | Status: AC
  Filled 2016-09-29: qty 10

## 2016-09-29 MED ORDER — PHENYLEPHRINE HCL 10 MG/ML IJ SOLN
INTRAMUSCULAR | Status: DC | PRN
  Administered 2016-09-29: 100 ug via INTRAVENOUS

## 2016-09-29 MED ORDER — SODIUM CHLORIDE 0.9 % IV SOLN: INTRAVENOUS | Status: DC

## 2016-09-29 MED ORDER — PROPOFOL 10 MG/ML IV BOLUS
INTRAVENOUS | Status: DC | PRN
  Administered 2016-09-29: 50 mg via INTRAVENOUS

## 2016-09-29 MED ORDER — LIDOCAINE HCL (PF) 2 % IJ SOLN
INTRAMUSCULAR | Status: AC
  Filled 2016-09-29: qty 2

## 2016-09-29 MED ORDER — GLYCOPYRROLATE 0.2 MG/ML IJ SOLN
INTRAMUSCULAR | Status: DC | PRN
  Administered 2016-09-29: 0.2 mg via INTRAVENOUS

## 2016-09-29 MED ORDER — PROPOFOL 500 MG/50ML IV EMUL
INTRAVENOUS | Status: AC
  Filled 2016-09-29: qty 50

## 2016-09-29 MED ORDER — GLYCOPYRROLATE 0.2 MG/ML IJ SOLN
INTRAMUSCULAR | Status: AC
  Filled 2016-09-29: qty 1

## 2016-09-29 MED ORDER — PROPOFOL 10 MG/ML IV BOLUS
INTRAVENOUS | Status: AC
Start: 2016-09-29 — End: 2016-09-29
  Filled 2016-09-29: qty 20

## 2016-09-29 MED ORDER — SODIUM CHLORIDE 0.9 % IV SOLN
INTRAVENOUS | Status: DC
  Administered 2016-09-29: 1000 mL via INTRAVENOUS
  Administered 2016-09-29: 11:00:00 via INTRAVENOUS

## 2016-09-29 NOTE — Transfer of Care (Signed)
Immediate Anesthesia Transfer of Care Note  Patient: Nicholas Parker  Procedure(s) Performed: Procedure(s): ESOPHAGOGASTRODUODENOSCOPY (EGD) WITH PROPOFOL (N/A)  Patient Location: Endoscopy Unit  Anesthesia Type:General  Level of Consciousness: awake, alert , oriented and patient cooperative  Airway & Oxygen Therapy: Patient Spontanous Breathing and Patient connected to nasal cannula oxygen  Post-op Assessment: Report given to RN, Post -op Vital signs reviewed and stable and Patient moving all extremities X 4  Post vital signs: Reviewed and stable  Last Vitals:  Vitals:   09/29/16 1041  BP: (!) 123/53  Pulse: 68  Resp: 16  Temp: 36.2 C    Last Pain:  Vitals:   09/29/16 1041  TempSrc: Tympanic         Complications: No apparent anesthesia complications

## 2016-09-29 NOTE — Anesthesia Post-op Follow-up Note (Cosign Needed)
Anesthesia QCDR form completed.        

## 2016-09-29 NOTE — Anesthesia Preprocedure Evaluation (Signed)
Anesthesia Evaluation  Patient identified by MRN, date of birth, ID band Patient awake    Reviewed: Allergy & Precautions, NPO status , Patient's Chart, lab work & pertinent test results  History of Anesthesia Complications Negative for: history of anesthetic complications  Airway Mallampati: II  TM Distance: >3 FB     Dental  (+) Poor Dentition, Missing   Pulmonary shortness of breath and with exertion, neg sleep apnea, neg COPD, former smoker,    breath sounds clear to auscultation- rhonchi (-) wheezing      Cardiovascular hypertension, Pt. on medications (-) angina+ CAD, + Past MI and + Cardiac Stents (last stent January 2017)   Rhythm:Regular Rate:Normal - Systolic murmurs and - Diastolic murmurs Echo 9/38/18: - Left ventricle: The cavity size was mildly dilated. Wall   thickness was normal. Systolic function was moderately reduced.   The estimated ejection fraction was in the range of 35% to 40%.   There is akinesis of the inferolateral, inferior, and   inferoseptal myocardium. Doppler parameters are consistent with   abnormal left ventricular relaxation (grade 1 diastolic   dysfunction). - Right ventricle: The cavity size was mildly dilated. Wall   thickness was normal. Systolic function was mildly reduced. - Right atrium: The atrium was mildly dilated.  L heart cath 09/17/15:  Mid RCA lesion, 60% stenosed.  Post Atrio lesion, 40% stenosed.  Ost RCA lesion, 60% stenosed.  Ost LM to LM lesion, 30% stenosed.  Ost Cx to Prox Cx lesion, 40% stenosed.  Prox LAD to Mid LAD lesion, 50% stenosed. The lesion was previously treated with a stent (unknown type).  Dist LAD lesion, 60% stenosed.  Ost 2nd Diag to 2nd Diag lesion, 90% stenosed.  2nd Mrg lesion, 40% stenosed.  Prox RCA to Mid RCA lesion, 90% stenosed. Post intervention, there is a 0% residual stenosis. The lesion was previously treated with a stent (unknown  type) greater than two years ago.   1. Acute inferior myocardial infarction due to thrombotic subtotal occlusion of the proximal and midright coronary artery. 2. Mild to moderate elevation of left ventricular end-diastolic pressure (23 mmHg) 3. Successful aspiration thrombectomy and drug-eluting stent placement to the right coronary artery.   Neuro/Psych Seizures -, Well Controlled,  negative psych ROS   GI/Hepatic Neg liver ROS, GERD  ,  Endo/Other  diabetes (diet controlled)  Renal/GU Renal InsufficiencyRenal disease     Musculoskeletal   Abdominal (+) - obese,   Peds  Hematology  (+) anemia ,   Anesthesia Other Findings Past Medical History: No date: AAA (abdominal aortic aneurysm) (HCC) No date: Adenocarcinoma ([place]) No date: Anemia No date: B12 deficiency 10/18/2015: Cardiomyopathy, ischemic No date: Coronary artery disease No date: Diabetes mellitus without complication (HCC) No date: Diverticulosis No date: Dyspnea No date: GERD (gastroesophageal reflux disease) No date: Hyperlipidemia No date: Hypertension No date: Low testosterone 10/12/2015: Myocardial infarction No date: neuropathy No date: Neuropathy ([place]) No date: Skin cancer     Comment: Basal Cell 10/18/2015: Ventricular tachycardia, sustained ([place])   Reproductive/Obstetrics                             Anesthesia Physical Anesthesia Plan  ASA: III  Anesthesia Plan: General   Post-op Pain Management:    Induction: Intravenous  Airway Management Planned: Natural Airway  Additional Equipment:   Intra-op Plan:   Post-operative Plan:   Informed Consent: I have reviewed the patients History and Physical, chart, labs  and discussed the procedure including the risks, benefits and alternatives for the proposed anesthesia with the patient or authorized representative who has indicated his/her understanding and acceptance.   Dental advisory given  Plan Discussed with: CRNA  and Anesthesiologist  Anesthesia Plan Comments:         Anesthesia Quick Evaluation

## 2016-09-29 NOTE — Anesthesia Postprocedure Evaluation (Signed)
Anesthesia Post Note  Patient: Nicholas Parker  Procedure(s) Performed: Procedure(s) (LRB): ESOPHAGOGASTRODUODENOSCOPY (EGD) WITH PROPOFOL (N/A)  Patient location during evaluation: Endoscopy Anesthesia Type: General Level of consciousness: awake and alert and oriented Pain management: pain level controlled Vital Signs Assessment: post-procedure vital signs reviewed and stable Respiratory status: spontaneous breathing, nonlabored ventilation and respiratory function stable Cardiovascular status: blood pressure returned to baseline and stable Postop Assessment: no signs of nausea or vomiting Anesthetic complications: no     Last Vitals:  Vitals:   09/29/16 1150 09/29/16 1200  BP: (!) 96/51 (!) 123/56  Pulse: 83 80  Resp: (!) 26 (!) 31  Temp: 36.2 C     Last Pain:  Vitals:   09/29/16 1150  TempSrc: Tympanic                 Tamie Minteer

## 2016-09-29 NOTE — Op Note (Signed)
[facility] Patient Name: Nicholas Parker Procedure Date: 09/29/2016 11:05 AM MRN: [id number] Account #: [id number] Date of Birth: October 30, 1933 Admit Type: Outpatient Age: 83 Room: [facility] ENDO ROOM 3 Gender: Male Note Status: Finalized Procedure:            Upper GI endoscopy Indications:          Dysphagia Providers:            Lollie Sails, MD Referring MD:         [name], MD (Referring MD) Medicines:            Monitored Anesthesia Care Complications:        No immediate complications. Procedure:            Pre-Anesthesia Assessment:                       - ASA Grade Assessment: III - A patient with severe                        systemic disease.                       After obtaining informed consent, the endoscope was                        passed under direct vision. Throughout the procedure,                        the patient's blood pressure, pulse, and oxygen                        saturations were monitored continuously. The Endoscope                        was introduced through the mouth, and advanced to the                        pylorus. The upper GI endoscopy was unusually difficult                        due to extrinsic compression. The patient tolerated the                        procedure well. Findings:      The esophagus lumen is generous in the mid and upper esophagus. There is       evidence of food and pill debris throughout, particularly in the lower       third. The standard endoscope will not pass through the lower 3 cm of       esophagus due to marked stenosis. There did not seem to be any overt       evidence of esophagitis or mass. The adult endoscope was removed and the       infant gastroscope was introduced and carefully taken into the gastric       vault. The lumen was tight even to passage of the infant gastroscope.       Retroflex evaluation of the cardia shows no evidence of abnormality.       This scope was  passed to the pylorus, however but to coiling I could not       pass it  further. There was pill debris in the antrum making visibility       difficult. Several biopsies of the inside of the stenotic area were done  Impression:           - No specimens collected. Recommendation:       - Discharge patient to home.                       - Await pathology results.                       - Perform routine esophageal manometry at appointment                        to be scheduled. Further management depending on result.                       - Continue present medications. Procedure Code(s):    --- Professional ---                       [id number], 52, Esophagogastroduodenoscopy, flexible,                        transoral; diagnostic, including collection of                        specimen(s) by brushing or washing, when performed                        (separate procedure) Diagnosis Code(s):    --- Professional ---                       R13.10, Dysphagia, unspecified CPT copyright 2016 [employer]. All rights reserved. The codes documented in this report are preliminary and upon coder review may  be revised to meet current compliance requirements. Lollie Sails, MD 09/29/2016 11:57:08 AM This report has been signed electronically. Number of Addenda: 0 Note Initiated On: 09/29/2016 11:05 AM      [facility]

## 2016-09-29 NOTE — H&P (Signed)
Outpatient short stay form Pre-procedure 09/29/2016 11:02 AM Lollie Sails MD  Primary Physician: Dr. [name]  Reason for visit:  EGD  History of present illness:  Patient is a 83 year old male presenting today as above. He has a history of an esophageal foreign body several weeks ago. At that time the foreign body been in for several days and there was a large amount of local edema. This necessitated passage of a pediatric gastroscope into the stomach to view the gastric vault and small intestine. He has subsequently been on a proton pump inhibitor twice a day. Also soft foods. He states he is handling these items very well. He has had no regurgitation since then. He also had a CT scan of the abdomen and pelvis this showing a unremarkable esophagus last week.    Current Facility-Administered Medications:  .  0.9 %  sodium chloride infusion, , Intravenous, Continuous, Lollie Sails, MD, Last Rate: 20 mL/hr at 09/29/16 1058, 1,000 mL at 09/29/16 1058 .  0.9 %  sodium chloride infusion, , Intravenous, Continuous, Lollie Sails, MD  Prescriptions Prior to Admission  Medication Sig Dispense Refill Last Dose  . lovastatin (MEVACOR) 40 MG tablet Take 40 mg by mouth at bedtime.     . metoprolol succinate (TOPROL-XL) 25 MG 24 hr tablet Take 25 mg by mouth daily.   09/29/2016 at 0700  . aspirin EC 81 MG tablet Take 81 mg by mouth daily.   Taking  . lisinopril (PRINIVIL,ZESTRIL) 5 MG tablet Take 5 mg by mouth daily.   Taking  . oxyCODONE-acetaminophen (PERCOCET/ROXICET) 5-325 MG tablet Take 1 tablet by mouth every 4 (four) hours as needed for severe pain.   Taking  . pantoprazole (PROTONIX) 40 MG tablet Take 40 mg by mouth daily.    Taking  . vitamin B-12 (CYANOCOBALAMIN) 500 MCG tablet Take 500 mcg by mouth daily.   Taking  . [DISCONTINUED] amiodarone (PACERONE) 200 MG tablet Take 1 tablet (200 mg total) by mouth daily. 90 tablet 3   . [DISCONTINUED] ampicillin (PRINCIPEN) 500 MG capsule  Take 1 capsule (500 mg total) by mouth 4 (four) times daily. 28 capsule 0 Taking  . [DISCONTINUED] clopidogrel (PLAVIX) 75 MG tablet Take 1 tablet (75 mg total) by mouth daily with breakfast. 30 tablet 11 Taking  . [DISCONTINUED] furosemide (LASIX) 20 MG tablet Take 1 tablet (20 mg total) by mouth 2 (two) times daily. 4 tablet 0 Taking  . [DISCONTINUED] loratadine (CLARITIN) 10 MG tablet Take 10 mg by mouth daily.   Taking  . [DISCONTINUED] metFORMIN (GLUCOPHAGE) 500 MG tablet Take 500 mg by mouth 2 (two) times daily with a meal.    Taking  . [DISCONTINUED] simvastatin (ZOCOR) 40 MG tablet Take 40 mg by mouth at bedtime.    Taking     Allergies  Allergen Reactions  . Lipitor [Atorvastatin] Other (See Comments)    Reaction:  Leg cramps   . Pravachol [Pravastatin Sodium] Other (See Comments)    Reaction:  Leg cramps  . Riomet [Metformin Hcl] Diarrhea  . Vytorin [Ezetimibe-Simvastatin] Other (See Comments)    Reaction:  Leg cramps     Past Medical History:  Diagnosis Date  . AAA (abdominal aortic aneurysm) ([place])   . Adenocarcinoma ([place])   . Anemia   . B12 deficiency   . Cardiomyopathy, ischemic 10/18/2015  . Coronary artery disease   . Diabetes mellitus without complication ([place])   . Diverticulosis   . Dyspnea   . GERD (  gastroesophageal reflux disease)   . Hyperlipidemia   . Hypertension   . Low testosterone   . Myocardial infarction 10/12/2015  . neuropathy   . Neuropathy ([place])   . Skin cancer    Basal Cell  . Ventricular tachycardia, sustained ([place]) 10/18/2015    Review of systems:      Physical Exam    Heart and lungs: Regular rate and rhythm without rub or gallop, lungs are bilaterally clear.    HEENT: Normocephalic atraumatic eyes are anicteric    Other:     Pertinant exam for procedure: Soft nontender nondistended bowel sounds positive normoactive.    Planned proceedures: EGD with indicated procedures. I have also discussed this case with Dr. Vira Agar and he  will provide assistance if needed, also discussed with patient. I have discussed the risks benefits and complications of procedures to include not limited to bleeding, infection, perforation and the risk of sedation and the patient wishes to proceed.    Lollie Sails, MD Gastroenterology 09/29/2016  11:02 AM

## 2016-10-02 ENCOUNTER — Encounter: Payer: Self-pay | Admitting: Gastroenterology

## 2016-10-04 LAB — SURGICAL PATHOLOGY

## 2016-10-12 DIAGNOSIS — I5022 Chronic systolic (congestive) heart failure: Secondary | ICD-10-CM | POA: Diagnosis not present

## 2016-10-12 DIAGNOSIS — I714 Abdominal aortic aneurysm, without rupture: Secondary | ICD-10-CM | POA: Diagnosis not present

## 2016-10-12 DIAGNOSIS — I2510 Atherosclerotic heart disease of native coronary artery without angina pectoris: Secondary | ICD-10-CM | POA: Diagnosis not present

## 2016-10-12 DIAGNOSIS — I10 Essential (primary) hypertension: Secondary | ICD-10-CM | POA: Diagnosis not present

## 2016-10-13 DIAGNOSIS — K224 Dyskinesia of esophagus: Secondary | ICD-10-CM | POA: Diagnosis not present

## 2016-10-13 DIAGNOSIS — R1310 Dysphagia, unspecified: Secondary | ICD-10-CM | POA: Diagnosis not present

## 2016-10-13 DIAGNOSIS — K222 Esophageal obstruction: Secondary | ICD-10-CM | POA: Diagnosis not present

## 2016-10-13 DIAGNOSIS — R1314 Dysphagia, pharyngoesophageal phase: Secondary | ICD-10-CM | POA: Diagnosis not present

## 2016-10-26 DIAGNOSIS — K229 Disease of esophagus, unspecified: Secondary | ICD-10-CM | POA: Diagnosis not present

## 2016-11-20 DIAGNOSIS — K222 Esophageal obstruction: Secondary | ICD-10-CM | POA: Diagnosis not present

## 2016-11-20 DIAGNOSIS — K229 Disease of esophagus, unspecified: Secondary | ICD-10-CM | POA: Diagnosis not present

## 2016-11-20 DIAGNOSIS — Z6827 Body mass index (BMI) 27.0-27.9, adult: Secondary | ICD-10-CM | POA: Diagnosis not present

## 2016-11-20 DIAGNOSIS — R1310 Dysphagia, unspecified: Secondary | ICD-10-CM | POA: Diagnosis not present

## 2016-11-27 DIAGNOSIS — E119 Type 2 diabetes mellitus without complications: Secondary | ICD-10-CM | POA: Diagnosis not present

## 2016-11-27 DIAGNOSIS — E785 Hyperlipidemia, unspecified: Secondary | ICD-10-CM | POA: Diagnosis not present

## 2016-11-27 DIAGNOSIS — I10 Essential (primary) hypertension: Secondary | ICD-10-CM | POA: Diagnosis not present

## 2016-11-27 DIAGNOSIS — I2510 Atherosclerotic heart disease of native coronary artery without angina pectoris: Secondary | ICD-10-CM | POA: Diagnosis not present

## 2016-11-27 DIAGNOSIS — K222 Esophageal obstruction: Secondary | ICD-10-CM | POA: Diagnosis not present

## 2016-11-27 DIAGNOSIS — Z85038 Personal history of other malignant neoplasm of large intestine: Secondary | ICD-10-CM | POA: Diagnosis not present

## 2016-11-27 DIAGNOSIS — R1310 Dysphagia, unspecified: Secondary | ICD-10-CM | POA: Diagnosis not present

## 2016-11-27 DIAGNOSIS — K449 Diaphragmatic hernia without obstruction or gangrene: Secondary | ICD-10-CM | POA: Diagnosis not present

## 2016-11-27 DIAGNOSIS — K219 Gastro-esophageal reflux disease without esophagitis: Secondary | ICD-10-CM | POA: Diagnosis not present

## 2016-11-27 DIAGNOSIS — I252 Old myocardial infarction: Secondary | ICD-10-CM | POA: Diagnosis not present

## 2016-12-21 DIAGNOSIS — E119 Type 2 diabetes mellitus without complications: Secondary | ICD-10-CM | POA: Diagnosis not present

## 2017-01-18 DIAGNOSIS — Z955 Presence of coronary angioplasty implant and graft: Secondary | ICD-10-CM | POA: Diagnosis not present

## 2017-01-18 DIAGNOSIS — I10 Essential (primary) hypertension: Secondary | ICD-10-CM | POA: Diagnosis not present

## 2017-01-18 DIAGNOSIS — Z87891 Personal history of nicotine dependence: Secondary | ICD-10-CM | POA: Diagnosis not present

## 2017-01-18 DIAGNOSIS — Z85038 Personal history of other malignant neoplasm of large intestine: Secondary | ICD-10-CM | POA: Diagnosis not present

## 2017-01-18 DIAGNOSIS — I252 Old myocardial infarction: Secondary | ICD-10-CM | POA: Diagnosis not present

## 2017-01-18 DIAGNOSIS — R1310 Dysphagia, unspecified: Secondary | ICD-10-CM | POA: Diagnosis not present

## 2017-01-18 DIAGNOSIS — K449 Diaphragmatic hernia without obstruction or gangrene: Secondary | ICD-10-CM | POA: Diagnosis not present

## 2017-01-18 DIAGNOSIS — K222 Esophageal obstruction: Secondary | ICD-10-CM | POA: Diagnosis not present

## 2017-01-18 DIAGNOSIS — I2510 Atherosclerotic heart disease of native coronary artery without angina pectoris: Secondary | ICD-10-CM | POA: Diagnosis not present

## 2017-02-08 DIAGNOSIS — D518 Other vitamin B12 deficiency anemias: Secondary | ICD-10-CM | POA: Diagnosis not present

## 2017-02-08 DIAGNOSIS — I10 Essential (primary) hypertension: Secondary | ICD-10-CM | POA: Diagnosis not present

## 2017-02-08 DIAGNOSIS — E118 Type 2 diabetes mellitus with unspecified complications: Secondary | ICD-10-CM | POA: Diagnosis not present

## 2017-02-08 DIAGNOSIS — Z79899 Other long term (current) drug therapy: Secondary | ICD-10-CM | POA: Diagnosis not present

## 2017-02-12 IMAGING — CT CT CHEST W/ CM
2 of 5 series · 12 of 36 positions shown, 15 images · IV contrast (iopamidol)
Comparison: CT the abdomen and pelvis 10/14/2015.

CLINICAL DATA: 83-year-old male with history of dysphagia and
weight loss. Narrowing of the esophagus noted on EGD 08/29/2016.

EXAM:
CT CHEST, ABDOMEN, AND PELVIS WITH CONTRAST
TECHNIQUE: Multidetector CT imaging of the chest, abdomen and pelvis was
performed following the standard protocol during bolus
administration of intravenous contrast.
CONTRAST:  100mL ZHPXF3-WGG IOPAMIDOL (ZHPXF3-WGG) INJECTION 61%

[Series 2: cap with · axial · 0.81mm/px · z∈[-633,-98]mm · 9 of 131 slices shown, 12 images]
[im 12/131  mediastinal]
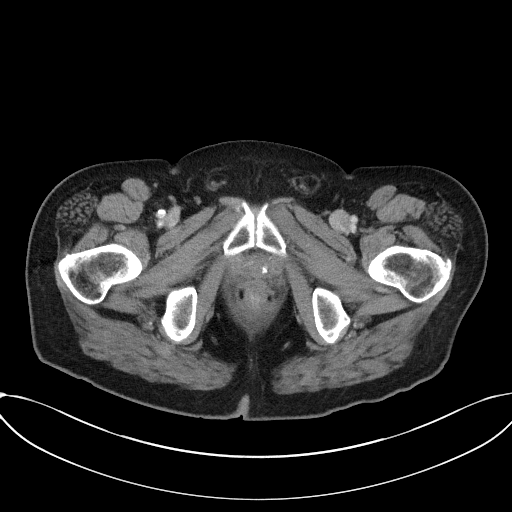
[im 12/131  lung]
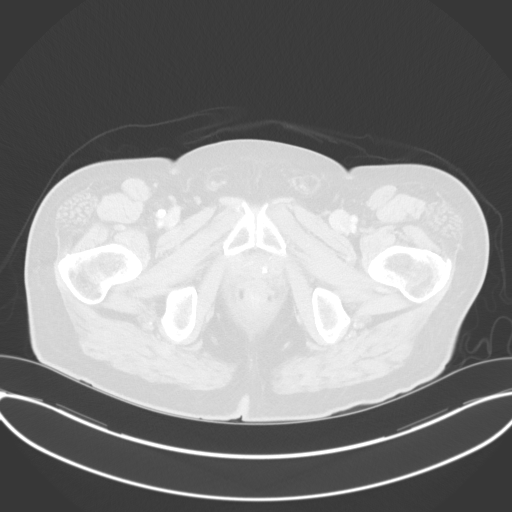
[im 24/131  lung]
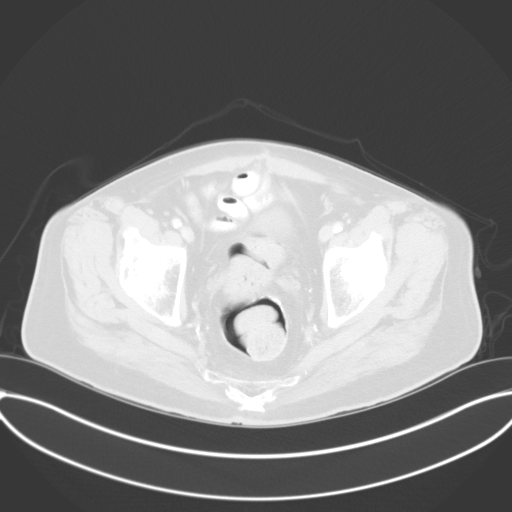
[im 36/131  lung]
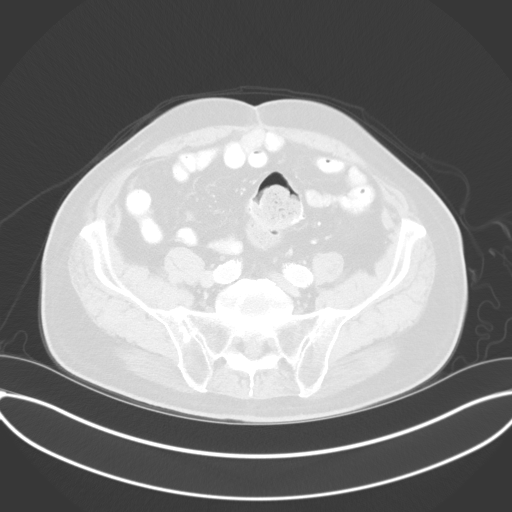
[im 48/131  lung]
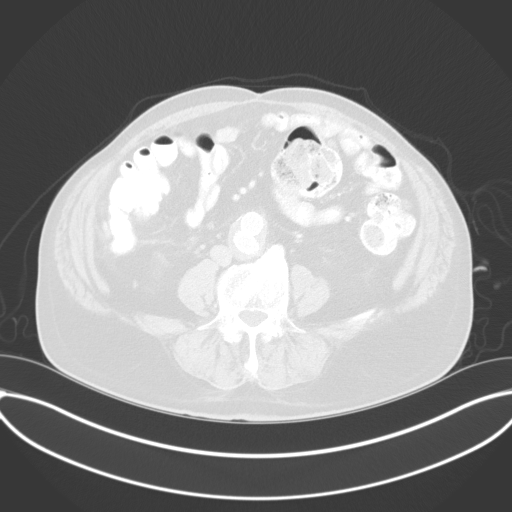
[im 71/131  mediastinal]
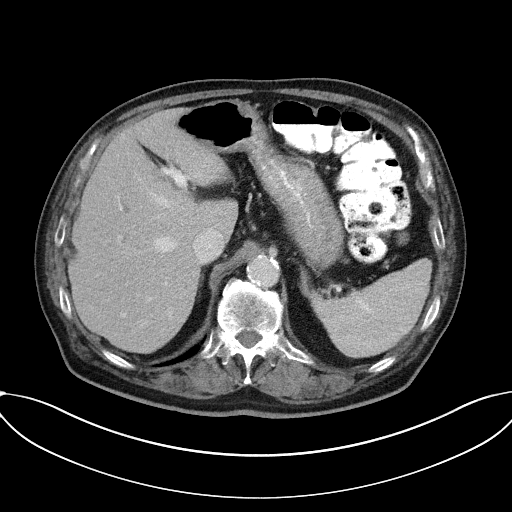
[im 71/131  lung]
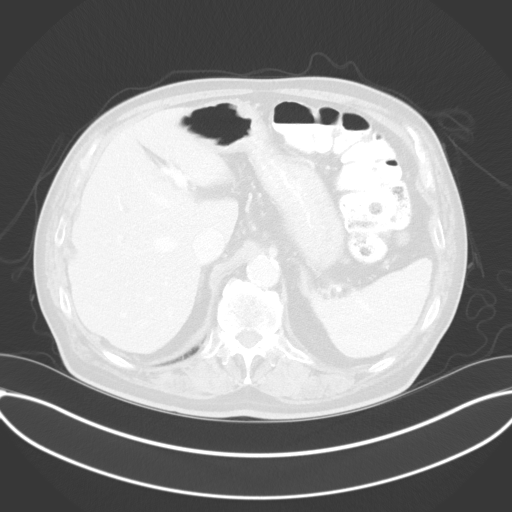
[im 83/131  lung]
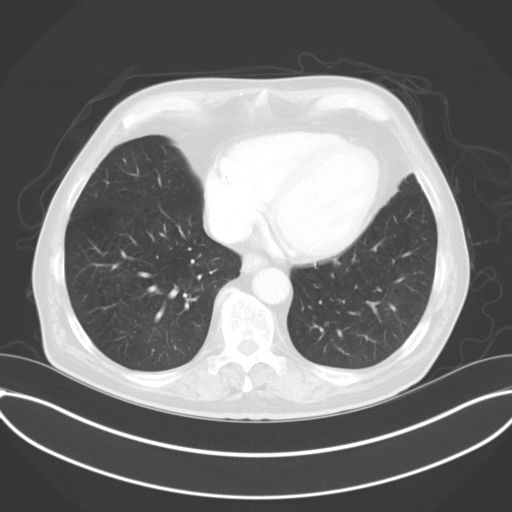
[im 95/131  lung]
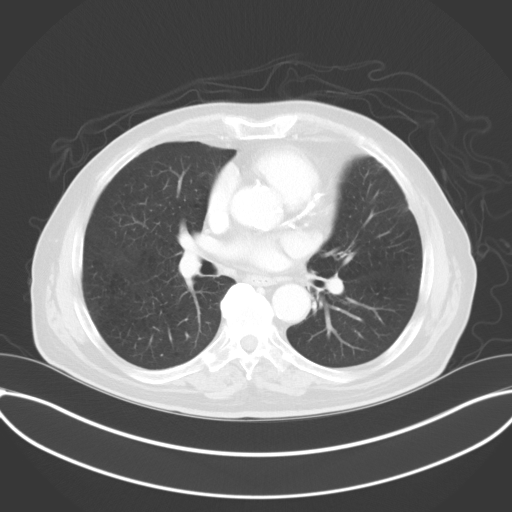
[im 107/131  lung]
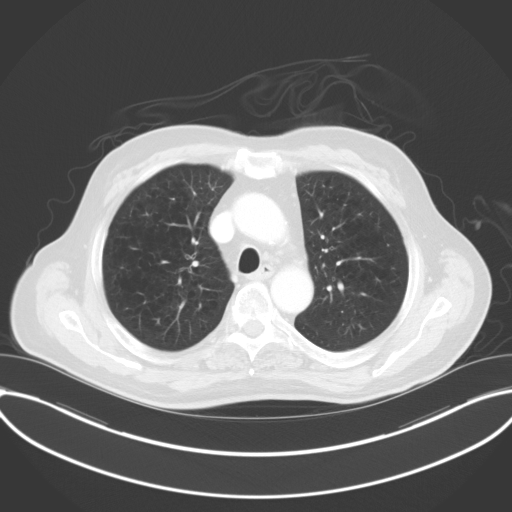
[im 119/131  mediastinal]
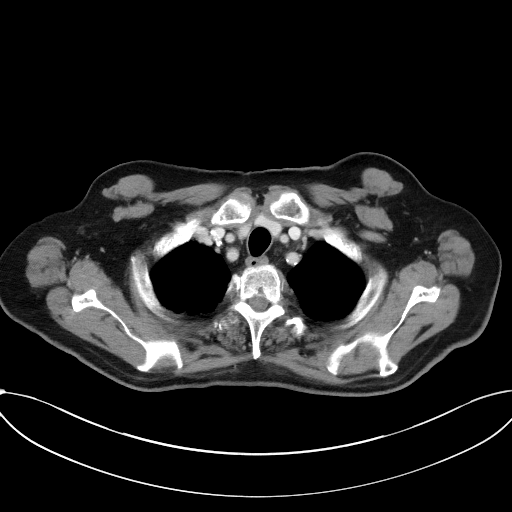
[im 119/131  lung]
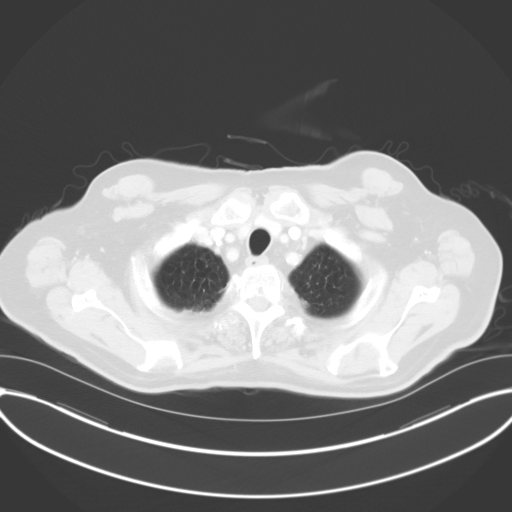

[Series 5: coronals · coronal · 0.81mm/px · 3 of 137 slices shown]
[im 28/137  lung]
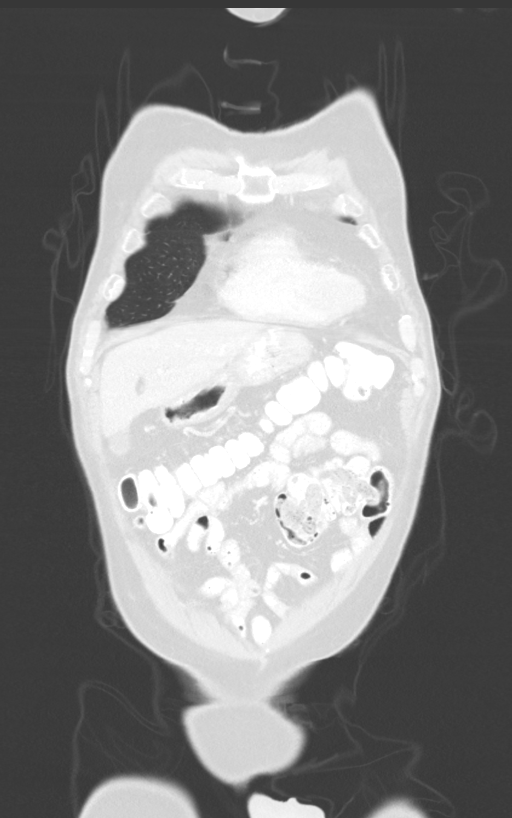
[im 55/137  lung]
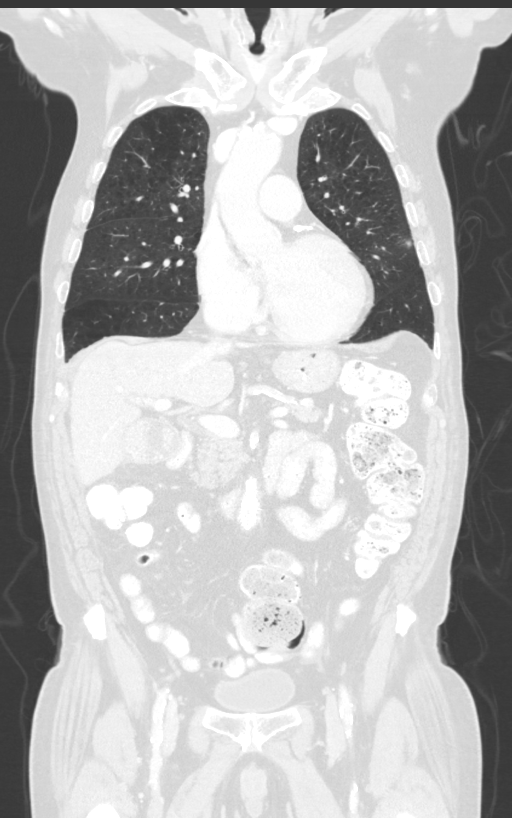
[im 82/137  lung]
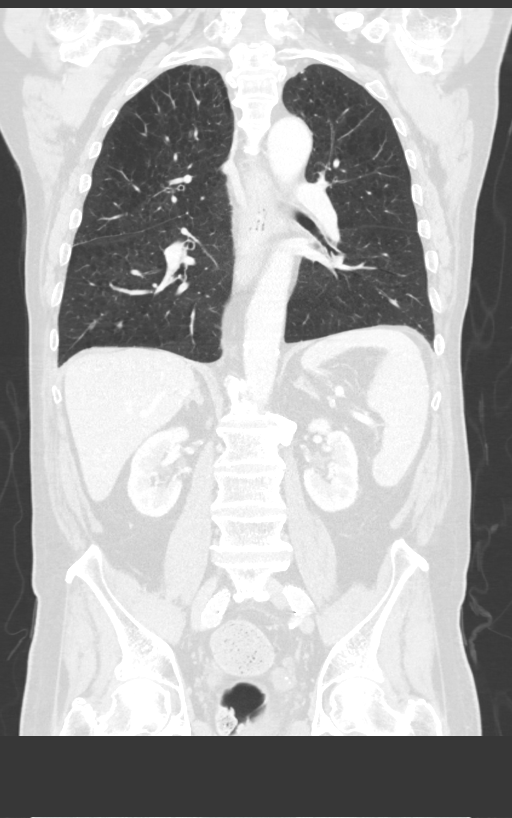

[12 of 36 positions shown; findings below may reference images not displayed]

FINDINGS: CT CHEST FINDINGS

Cardiovascular: Heart size is normal. There is no significant
pericardial fluid, thickening or pericardial calcification. There is
aortic atherosclerosis, as well as atherosclerosis of the great
vessels of the mediastinum and the coronary arteries, including
calcified atherosclerotic plaque in the left main, left anterior
descending, left circumflex and right coronary arteries.
Calcifications of the aortic valve and mitral annulus.

Mediastinum/Nodes: No pathologically enlarged mediastinal or hilar
lymph nodes. Esophagus is unremarkable in appearance. No axillary
lymphadenopathy.

Lungs/Pleura: Mild diffuse bronchial wall thickening with moderate
centrilobular and mild paraseptal emphysema. 2 mm nodule in the
inferior segment of the lingula (image 100 of series 4) is highly
nonspecific. No other larger more suspicious appearing pulmonary
nodules or masses are noted. No acute consolidative airspace
disease. No pleural effusions.

Musculoskeletal: There are no aggressive appearing lytic or blastic
lesions noted in the visualized portions of the skeleton.

CT ABDOMEN PELVIS FINDINGS

Hepatobiliary: No cystic or solid hepatic lesions. No intra or
extrahepatic biliary ductal dilatation. Numerous tiny calcified
gallstones lie dependently in the gallbladder. No findings to
suggest an acute cholecystitis at this time.

Pancreas: No pancreatic mass. No pancreatic ductal dilatation. No
pancreatic or peripancreatic fluid or inflammatory changes.

Spleen: Unremarkable.

Adrenals/Urinary Tract: Bilateral kidneys and bilateral adrenal
glands are normal in appearance. No hydroureteronephrosis or
perinephric stranding to indicate urinary tract obstruction at this
time. Urinary bladder is normal in appearance.

Stomach/Bowel: Normal appearance of the stomach. There is no
pathologic dilatation of small bowel or colon. Status post
appendectomy.

Vascular/Lymphatic: Aortic atherosclerosis, with aorto bi-iliac
stent graft in place for treatment of prior abdominal aortic
aneurysm. The aneurysm sac appears similar in size to prior
examinations measuring up to 3.4 x 3.7 cm immediately before the
aortic bifurcation. No lymphadenopathy noted in the abdomen or
pelvis.

Reproductive: Prostate gland and seminal vesicles are unremarkable
in appearance.

Other: No significant volume of ascites.  No pneumoperitoneum.

Musculoskeletal: There are no aggressive appearing lytic or blastic
lesions noted in the visualized portions of the skeleton.
IMPRESSION: 1. No acute findings in the chest, abdomen or pelvis.
2. Despite the reported narrowing of the esophagus on EGD, the
esophagus is grossly unremarkable in appearance by CT imaging.
3. Aortic atherosclerosis, in addition to left main and 3 vessel
coronary artery disease.
4. There are calcifications of the aortic valve. Echocardiographic
correlation for evaluation of potential valvular dysfunction may be
warranted if clinically indicated.
5. Mild diffuse bronchial wall thickening with moderate
centrilobular and mild paraseptal emphysema; imaging findings
suggestive of underlying COPD.
6. 2 mm nodule in the inferior segment of the lingula is highly
nonspecific and statistically likely benign. However, given the
smoking related changes in the lungs, chest CT can be considered in
12 months. This recommendation follows the consensus statement:
Guidelines for Management of Incidental Pulmonary Nodules Detected
7. Additional incidental findings, as above.

## 2017-02-15 DIAGNOSIS — E118 Type 2 diabetes mellitus with unspecified complications: Secondary | ICD-10-CM | POA: Diagnosis not present

## 2017-02-15 DIAGNOSIS — K219 Gastro-esophageal reflux disease without esophagitis: Secondary | ICD-10-CM | POA: Diagnosis not present

## 2017-02-15 DIAGNOSIS — I714 Abdominal aortic aneurysm, without rupture: Secondary | ICD-10-CM | POA: Diagnosis not present

## 2017-02-15 DIAGNOSIS — I255 Ischemic cardiomyopathy: Secondary | ICD-10-CM | POA: Diagnosis not present

## 2017-02-15 DIAGNOSIS — D692 Other nonthrombocytopenic purpura: Secondary | ICD-10-CM | POA: Diagnosis not present

## 2017-02-15 DIAGNOSIS — E1142 Type 2 diabetes mellitus with diabetic polyneuropathy: Secondary | ICD-10-CM | POA: Diagnosis not present

## 2017-02-15 DIAGNOSIS — I2510 Atherosclerotic heart disease of native coronary artery without angina pectoris: Secondary | ICD-10-CM | POA: Diagnosis not present

## 2017-02-15 DIAGNOSIS — I5022 Chronic systolic (congestive) heart failure: Secondary | ICD-10-CM | POA: Diagnosis not present

## 2017-02-15 DIAGNOSIS — I10 Essential (primary) hypertension: Secondary | ICD-10-CM | POA: Diagnosis not present

## 2017-03-19 DIAGNOSIS — E1140 Type 2 diabetes mellitus with diabetic neuropathy, unspecified: Secondary | ICD-10-CM | POA: Diagnosis not present

## 2017-03-19 DIAGNOSIS — I10 Essential (primary) hypertension: Secondary | ICD-10-CM | POA: Diagnosis not present

## 2017-03-19 DIAGNOSIS — Z79899 Other long term (current) drug therapy: Secondary | ICD-10-CM | POA: Diagnosis not present

## 2017-03-19 DIAGNOSIS — K219 Gastro-esophageal reflux disease without esophagitis: Secondary | ICD-10-CM | POA: Diagnosis not present

## 2017-03-19 DIAGNOSIS — I2510 Atherosclerotic heart disease of native coronary artery without angina pectoris: Secondary | ICD-10-CM | POA: Diagnosis not present

## 2017-03-19 DIAGNOSIS — R1310 Dysphagia, unspecified: Secondary | ICD-10-CM | POA: Diagnosis not present

## 2017-03-19 DIAGNOSIS — K222 Esophageal obstruction: Secondary | ICD-10-CM | POA: Diagnosis not present

## 2017-03-19 DIAGNOSIS — I252 Old myocardial infarction: Secondary | ICD-10-CM | POA: Diagnosis not present

## 2017-03-19 DIAGNOSIS — Z955 Presence of coronary angioplasty implant and graft: Secondary | ICD-10-CM | POA: Diagnosis not present

## 2017-03-19 DIAGNOSIS — K449 Diaphragmatic hernia without obstruction or gangrene: Secondary | ICD-10-CM | POA: Diagnosis not present

## 2017-04-16 DIAGNOSIS — I2510 Atherosclerotic heart disease of native coronary artery without angina pectoris: Secondary | ICD-10-CM | POA: Diagnosis not present

## 2017-04-16 DIAGNOSIS — I5022 Chronic systolic (congestive) heart failure: Secondary | ICD-10-CM | POA: Diagnosis not present

## 2017-04-16 DIAGNOSIS — I10 Essential (primary) hypertension: Secondary | ICD-10-CM | POA: Diagnosis not present

## 2017-04-20 ENCOUNTER — Other Ambulatory Visit (INDEPENDENT_AMBULATORY_CARE_PROVIDER_SITE_OTHER): Payer: Self-pay | Admitting: Vascular Surgery

## 2017-04-20 DIAGNOSIS — IMO0001 Reserved for inherently not codable concepts without codable children: Secondary | ICD-10-CM

## 2017-04-20 DIAGNOSIS — T82330D Leakage of aortic (bifurcation) graft (replacement), subsequent encounter: Secondary | ICD-10-CM

## 2017-04-25 ENCOUNTER — Ambulatory Visit (INDEPENDENT_AMBULATORY_CARE_PROVIDER_SITE_OTHER): Payer: Self-pay | Admitting: Vascular Surgery

## 2017-04-25 ENCOUNTER — Encounter (INDEPENDENT_AMBULATORY_CARE_PROVIDER_SITE_OTHER): Payer: Medicare HMO

## 2017-04-25 ENCOUNTER — Ambulatory Visit (INDEPENDENT_AMBULATORY_CARE_PROVIDER_SITE_OTHER): Payer: Medicare HMO

## 2017-04-25 DIAGNOSIS — T82330D Leakage of aortic (bifurcation) graft (replacement), subsequent encounter: Secondary | ICD-10-CM

## 2017-04-25 DIAGNOSIS — IMO0001 Reserved for inherently not codable concepts without codable children: Secondary | ICD-10-CM

## 2017-06-05 DIAGNOSIS — Z85828 Personal history of other malignant neoplasm of skin: Secondary | ICD-10-CM | POA: Diagnosis not present

## 2017-06-05 DIAGNOSIS — X32XXXA Exposure to sunlight, initial encounter: Secondary | ICD-10-CM | POA: Diagnosis not present

## 2017-06-05 DIAGNOSIS — D485 Neoplasm of uncertain behavior of skin: Secondary | ICD-10-CM | POA: Diagnosis not present

## 2017-06-05 DIAGNOSIS — L570 Actinic keratosis: Secondary | ICD-10-CM | POA: Diagnosis not present

## 2017-06-05 DIAGNOSIS — C44612 Basal cell carcinoma of skin of right upper limb, including shoulder: Secondary | ICD-10-CM | POA: Diagnosis not present

## 2017-06-05 DIAGNOSIS — D2261 Melanocytic nevi of right upper limb, including shoulder: Secondary | ICD-10-CM | POA: Diagnosis not present

## 2017-06-05 DIAGNOSIS — D2272 Melanocytic nevi of left lower limb, including hip: Secondary | ICD-10-CM | POA: Diagnosis not present

## 2017-06-05 DIAGNOSIS — L218 Other seborrheic dermatitis: Secondary | ICD-10-CM | POA: Diagnosis not present

## 2017-06-11 DIAGNOSIS — C44612 Basal cell carcinoma of skin of right upper limb, including shoulder: Secondary | ICD-10-CM | POA: Diagnosis not present

## 2017-06-29 DIAGNOSIS — E785 Hyperlipidemia, unspecified: Secondary | ICD-10-CM | POA: Diagnosis not present

## 2017-06-29 DIAGNOSIS — M1990 Unspecified osteoarthritis, unspecified site: Secondary | ICD-10-CM | POA: Diagnosis not present

## 2017-06-29 DIAGNOSIS — R1310 Dysphagia, unspecified: Secondary | ICD-10-CM | POA: Diagnosis not present

## 2017-06-29 DIAGNOSIS — K449 Diaphragmatic hernia without obstruction or gangrene: Secondary | ICD-10-CM | POA: Diagnosis not present

## 2017-06-29 DIAGNOSIS — I2510 Atherosclerotic heart disease of native coronary artery without angina pectoris: Secondary | ICD-10-CM | POA: Diagnosis not present

## 2017-06-29 DIAGNOSIS — K222 Esophageal obstruction: Secondary | ICD-10-CM | POA: Diagnosis not present

## 2017-06-29 DIAGNOSIS — I10 Essential (primary) hypertension: Secondary | ICD-10-CM | POA: Diagnosis not present

## 2017-06-29 DIAGNOSIS — E1140 Type 2 diabetes mellitus with diabetic neuropathy, unspecified: Secondary | ICD-10-CM | POA: Diagnosis not present

## 2017-06-29 DIAGNOSIS — Z87891 Personal history of nicotine dependence: Secondary | ICD-10-CM | POA: Diagnosis not present

## 2017-06-29 DIAGNOSIS — I252 Old myocardial infarction: Secondary | ICD-10-CM | POA: Diagnosis not present

## 2017-08-08 DIAGNOSIS — K222 Esophageal obstruction: Secondary | ICD-10-CM | POA: Diagnosis not present

## 2017-08-08 DIAGNOSIS — E119 Type 2 diabetes mellitus without complications: Secondary | ICD-10-CM | POA: Diagnosis not present

## 2017-08-08 DIAGNOSIS — I2510 Atherosclerotic heart disease of native coronary artery without angina pectoris: Secondary | ICD-10-CM | POA: Diagnosis not present

## 2017-08-08 DIAGNOSIS — M19012 Primary osteoarthritis, left shoulder: Secondary | ICD-10-CM | POA: Diagnosis not present

## 2017-08-08 DIAGNOSIS — Z85038 Personal history of other malignant neoplasm of large intestine: Secondary | ICD-10-CM | POA: Diagnosis not present

## 2017-08-08 DIAGNOSIS — I252 Old myocardial infarction: Secondary | ICD-10-CM | POA: Diagnosis not present

## 2017-08-08 DIAGNOSIS — K449 Diaphragmatic hernia without obstruction or gangrene: Secondary | ICD-10-CM | POA: Diagnosis not present

## 2017-08-08 DIAGNOSIS — R1310 Dysphagia, unspecified: Secondary | ICD-10-CM | POA: Diagnosis not present

## 2017-08-08 DIAGNOSIS — Z955 Presence of coronary angioplasty implant and graft: Secondary | ICD-10-CM | POA: Diagnosis not present

## 2017-08-16 DIAGNOSIS — I10 Essential (primary) hypertension: Secondary | ICD-10-CM | POA: Diagnosis not present

## 2017-08-16 DIAGNOSIS — I2510 Atherosclerotic heart disease of native coronary artery without angina pectoris: Secondary | ICD-10-CM | POA: Diagnosis not present

## 2017-08-16 DIAGNOSIS — E118 Type 2 diabetes mellitus with unspecified complications: Secondary | ICD-10-CM | POA: Diagnosis not present

## 2017-08-16 DIAGNOSIS — D518 Other vitamin B12 deficiency anemias: Secondary | ICD-10-CM | POA: Diagnosis not present

## 2017-08-23 DIAGNOSIS — I714 Abdominal aortic aneurysm, without rupture: Secondary | ICD-10-CM | POA: Diagnosis not present

## 2017-08-23 DIAGNOSIS — I10 Essential (primary) hypertension: Secondary | ICD-10-CM | POA: Diagnosis not present

## 2017-08-23 DIAGNOSIS — I5022 Chronic systolic (congestive) heart failure: Secondary | ICD-10-CM | POA: Diagnosis not present

## 2017-08-23 DIAGNOSIS — E1142 Type 2 diabetes mellitus with diabetic polyneuropathy: Secondary | ICD-10-CM | POA: Diagnosis not present

## 2017-08-23 DIAGNOSIS — I2510 Atherosclerotic heart disease of native coronary artery without angina pectoris: Secondary | ICD-10-CM | POA: Diagnosis not present

## 2017-08-23 DIAGNOSIS — D649 Anemia, unspecified: Secondary | ICD-10-CM | POA: Diagnosis not present

## 2017-08-23 DIAGNOSIS — Z0000 Encounter for general adult medical examination without abnormal findings: Secondary | ICD-10-CM | POA: Diagnosis not present

## 2017-08-23 DIAGNOSIS — Z79899 Other long term (current) drug therapy: Secondary | ICD-10-CM | POA: Insufficient documentation

## 2017-08-23 DIAGNOSIS — D692 Other nonthrombocytopenic purpura: Secondary | ICD-10-CM | POA: Diagnosis not present

## 2017-08-23 DIAGNOSIS — R7989 Other specified abnormal findings of blood chemistry: Secondary | ICD-10-CM | POA: Diagnosis not present

## 2017-08-23 DIAGNOSIS — E118 Type 2 diabetes mellitus with unspecified complications: Secondary | ICD-10-CM | POA: Diagnosis not present

## 2017-10-15 DIAGNOSIS — K222 Esophageal obstruction: Secondary | ICD-10-CM | POA: Diagnosis not present

## 2017-10-15 DIAGNOSIS — I252 Old myocardial infarction: Secondary | ICD-10-CM | POA: Diagnosis not present

## 2017-10-15 DIAGNOSIS — I10 Essential (primary) hypertension: Secondary | ICD-10-CM | POA: Diagnosis not present

## 2017-10-15 DIAGNOSIS — E785 Hyperlipidemia, unspecified: Secondary | ICD-10-CM | POA: Diagnosis not present

## 2017-10-15 DIAGNOSIS — Z955 Presence of coronary angioplasty implant and graft: Secondary | ICD-10-CM | POA: Diagnosis not present

## 2017-10-15 DIAGNOSIS — I2510 Atherosclerotic heart disease of native coronary artery without angina pectoris: Secondary | ICD-10-CM | POA: Diagnosis not present

## 2017-10-15 DIAGNOSIS — E1140 Type 2 diabetes mellitus with diabetic neuropathy, unspecified: Secondary | ICD-10-CM | POA: Diagnosis not present

## 2017-10-15 DIAGNOSIS — R1310 Dysphagia, unspecified: Secondary | ICD-10-CM | POA: Diagnosis not present

## 2017-10-15 DIAGNOSIS — K449 Diaphragmatic hernia without obstruction or gangrene: Secondary | ICD-10-CM | POA: Diagnosis not present

## 2017-10-16 DIAGNOSIS — E118 Type 2 diabetes mellitus with unspecified complications: Secondary | ICD-10-CM | POA: Diagnosis not present

## 2017-10-16 DIAGNOSIS — I714 Abdominal aortic aneurysm, without rupture: Secondary | ICD-10-CM | POA: Diagnosis not present

## 2017-10-16 DIAGNOSIS — I2510 Atherosclerotic heart disease of native coronary artery without angina pectoris: Secondary | ICD-10-CM | POA: Diagnosis not present

## 2017-10-16 DIAGNOSIS — I10 Essential (primary) hypertension: Secondary | ICD-10-CM | POA: Diagnosis not present

## 2017-10-30 DIAGNOSIS — L570 Actinic keratosis: Secondary | ICD-10-CM | POA: Diagnosis not present

## 2017-10-30 DIAGNOSIS — Z85828 Personal history of other malignant neoplasm of skin: Secondary | ICD-10-CM | POA: Diagnosis not present

## 2017-10-30 DIAGNOSIS — L821 Other seborrheic keratosis: Secondary | ICD-10-CM | POA: Diagnosis not present

## 2017-10-30 DIAGNOSIS — X32XXXA Exposure to sunlight, initial encounter: Secondary | ICD-10-CM | POA: Diagnosis not present

## 2017-10-30 DIAGNOSIS — L218 Other seborrheic dermatitis: Secondary | ICD-10-CM | POA: Diagnosis not present

## 2017-10-30 DIAGNOSIS — Z08 Encounter for follow-up examination after completed treatment for malignant neoplasm: Secondary | ICD-10-CM | POA: Diagnosis not present

## 2018-01-01 DIAGNOSIS — E119 Type 2 diabetes mellitus without complications: Secondary | ICD-10-CM | POA: Diagnosis not present

## 2018-01-07 DIAGNOSIS — E1140 Type 2 diabetes mellitus with diabetic neuropathy, unspecified: Secondary | ICD-10-CM | POA: Diagnosis not present

## 2018-01-07 DIAGNOSIS — K222 Esophageal obstruction: Secondary | ICD-10-CM | POA: Diagnosis not present

## 2018-01-07 DIAGNOSIS — I252 Old myocardial infarction: Secondary | ICD-10-CM | POA: Diagnosis not present

## 2018-01-07 DIAGNOSIS — K449 Diaphragmatic hernia without obstruction or gangrene: Secondary | ICD-10-CM | POA: Diagnosis not present

## 2018-01-07 DIAGNOSIS — Z85038 Personal history of other malignant neoplasm of large intestine: Secondary | ICD-10-CM | POA: Diagnosis not present

## 2018-01-07 DIAGNOSIS — E785 Hyperlipidemia, unspecified: Secondary | ICD-10-CM | POA: Diagnosis not present

## 2018-01-07 DIAGNOSIS — I2510 Atherosclerotic heart disease of native coronary artery without angina pectoris: Secondary | ICD-10-CM | POA: Diagnosis not present

## 2018-01-07 DIAGNOSIS — Z955 Presence of coronary angioplasty implant and graft: Secondary | ICD-10-CM | POA: Diagnosis not present

## 2018-01-07 DIAGNOSIS — R1310 Dysphagia, unspecified: Secondary | ICD-10-CM | POA: Diagnosis not present

## 2018-01-29 DIAGNOSIS — J40 Bronchitis, not specified as acute or chronic: Secondary | ICD-10-CM | POA: Diagnosis not present

## 2018-01-29 DIAGNOSIS — I5022 Chronic systolic (congestive) heart failure: Secondary | ICD-10-CM | POA: Diagnosis not present

## 2018-01-29 DIAGNOSIS — R05 Cough: Secondary | ICD-10-CM | POA: Diagnosis not present

## 2018-01-29 DIAGNOSIS — E118 Type 2 diabetes mellitus with unspecified complications: Secondary | ICD-10-CM | POA: Diagnosis not present

## 2018-02-14 DIAGNOSIS — D649 Anemia, unspecified: Secondary | ICD-10-CM | POA: Diagnosis not present

## 2018-02-14 DIAGNOSIS — I10 Essential (primary) hypertension: Secondary | ICD-10-CM | POA: Diagnosis not present

## 2018-02-14 DIAGNOSIS — E118 Type 2 diabetes mellitus with unspecified complications: Secondary | ICD-10-CM | POA: Diagnosis not present

## 2018-02-14 DIAGNOSIS — Z79899 Other long term (current) drug therapy: Secondary | ICD-10-CM | POA: Diagnosis not present

## 2018-02-14 DIAGNOSIS — D518 Other vitamin B12 deficiency anemias: Secondary | ICD-10-CM | POA: Diagnosis not present

## 2018-02-14 DIAGNOSIS — E782 Mixed hyperlipidemia: Secondary | ICD-10-CM | POA: Diagnosis not present

## 2018-02-21 DIAGNOSIS — I5022 Chronic systolic (congestive) heart failure: Secondary | ICD-10-CM | POA: Diagnosis not present

## 2018-02-21 DIAGNOSIS — D692 Other nonthrombocytopenic purpura: Secondary | ICD-10-CM | POA: Diagnosis not present

## 2018-02-21 DIAGNOSIS — K219 Gastro-esophageal reflux disease without esophagitis: Secondary | ICD-10-CM | POA: Diagnosis not present

## 2018-02-21 DIAGNOSIS — E1142 Type 2 diabetes mellitus with diabetic polyneuropathy: Secondary | ICD-10-CM | POA: Diagnosis not present

## 2018-02-21 DIAGNOSIS — E118 Type 2 diabetes mellitus with unspecified complications: Secondary | ICD-10-CM | POA: Diagnosis not present

## 2018-02-21 DIAGNOSIS — I10 Essential (primary) hypertension: Secondary | ICD-10-CM | POA: Diagnosis not present

## 2018-02-21 DIAGNOSIS — I714 Abdominal aortic aneurysm, without rupture: Secondary | ICD-10-CM | POA: Diagnosis not present

## 2018-02-21 DIAGNOSIS — I2510 Atherosclerotic heart disease of native coronary artery without angina pectoris: Secondary | ICD-10-CM | POA: Diagnosis not present

## 2018-02-21 DIAGNOSIS — I255 Ischemic cardiomyopathy: Secondary | ICD-10-CM | POA: Diagnosis not present

## 2018-04-15 DIAGNOSIS — I714 Abdominal aortic aneurysm, without rupture: Secondary | ICD-10-CM | POA: Diagnosis not present

## 2018-04-15 DIAGNOSIS — I10 Essential (primary) hypertension: Secondary | ICD-10-CM | POA: Diagnosis not present

## 2018-04-15 DIAGNOSIS — I2510 Atherosclerotic heart disease of native coronary artery without angina pectoris: Secondary | ICD-10-CM | POA: Diagnosis not present

## 2018-04-15 DIAGNOSIS — E782 Mixed hyperlipidemia: Secondary | ICD-10-CM | POA: Diagnosis not present

## 2018-04-15 DIAGNOSIS — I5022 Chronic systolic (congestive) heart failure: Secondary | ICD-10-CM | POA: Diagnosis not present

## 2018-04-15 DIAGNOSIS — I255 Ischemic cardiomyopathy: Secondary | ICD-10-CM | POA: Diagnosis not present

## 2018-04-15 DIAGNOSIS — E1142 Type 2 diabetes mellitus with diabetic polyneuropathy: Secondary | ICD-10-CM | POA: Diagnosis not present

## 2018-04-25 ENCOUNTER — Other Ambulatory Visit (INDEPENDENT_AMBULATORY_CARE_PROVIDER_SITE_OTHER): Payer: Medicare HMO

## 2018-04-25 ENCOUNTER — Encounter (INDEPENDENT_AMBULATORY_CARE_PROVIDER_SITE_OTHER): Payer: Medicare HMO

## 2018-04-25 ENCOUNTER — Ambulatory Visit (INDEPENDENT_AMBULATORY_CARE_PROVIDER_SITE_OTHER): Payer: Medicare HMO | Admitting: Vascular Surgery

## 2018-05-20 DIAGNOSIS — D649 Anemia, unspecified: Secondary | ICD-10-CM | POA: Diagnosis not present

## 2018-05-20 DIAGNOSIS — E118 Type 2 diabetes mellitus with unspecified complications: Secondary | ICD-10-CM | POA: Diagnosis not present

## 2018-05-26 DIAGNOSIS — G63 Polyneuropathy in diseases classified elsewhere: Secondary | ICD-10-CM | POA: Insufficient documentation

## 2018-05-26 DIAGNOSIS — N1831 Chronic kidney disease, stage 3a: Secondary | ICD-10-CM | POA: Insufficient documentation

## 2018-05-27 DIAGNOSIS — Z0000 Encounter for general adult medical examination without abnormal findings: Secondary | ICD-10-CM | POA: Diagnosis not present

## 2018-05-27 DIAGNOSIS — D692 Other nonthrombocytopenic purpura: Secondary | ICD-10-CM | POA: Diagnosis not present

## 2018-05-27 DIAGNOSIS — E1122 Type 2 diabetes mellitus with diabetic chronic kidney disease: Secondary | ICD-10-CM | POA: Diagnosis not present

## 2018-05-27 DIAGNOSIS — N183 Chronic kidney disease, stage 3 (moderate): Secondary | ICD-10-CM | POA: Diagnosis not present

## 2018-05-27 DIAGNOSIS — E118 Type 2 diabetes mellitus with unspecified complications: Secondary | ICD-10-CM | POA: Diagnosis not present

## 2018-05-27 DIAGNOSIS — I2510 Atherosclerotic heart disease of native coronary artery without angina pectoris: Secondary | ICD-10-CM | POA: Diagnosis not present

## 2018-05-27 DIAGNOSIS — I255 Ischemic cardiomyopathy: Secondary | ICD-10-CM | POA: Diagnosis not present

## 2018-05-27 DIAGNOSIS — I714 Abdominal aortic aneurysm, without rupture: Secondary | ICD-10-CM | POA: Diagnosis not present

## 2018-05-27 DIAGNOSIS — E1142 Type 2 diabetes mellitus with diabetic polyneuropathy: Secondary | ICD-10-CM | POA: Diagnosis not present

## 2018-05-27 DIAGNOSIS — I10 Essential (primary) hypertension: Secondary | ICD-10-CM | POA: Diagnosis not present

## 2018-05-27 DIAGNOSIS — I5022 Chronic systolic (congestive) heart failure: Secondary | ICD-10-CM | POA: Diagnosis not present

## 2018-05-27 DIAGNOSIS — Z23 Encounter for immunization: Secondary | ICD-10-CM | POA: Diagnosis not present

## 2018-06-12 ENCOUNTER — Other Ambulatory Visit (INDEPENDENT_AMBULATORY_CARE_PROVIDER_SITE_OTHER): Payer: Self-pay | Admitting: Vascular Surgery

## 2018-06-12 DIAGNOSIS — I714 Abdominal aortic aneurysm, without rupture, unspecified: Secondary | ICD-10-CM

## 2018-06-13 ENCOUNTER — Encounter (INDEPENDENT_AMBULATORY_CARE_PROVIDER_SITE_OTHER): Payer: Medicare HMO

## 2018-06-13 ENCOUNTER — Encounter (INDEPENDENT_AMBULATORY_CARE_PROVIDER_SITE_OTHER): Payer: Self-pay | Admitting: Vascular Surgery

## 2018-06-13 ENCOUNTER — Ambulatory Visit (INDEPENDENT_AMBULATORY_CARE_PROVIDER_SITE_OTHER): Payer: Medicare HMO | Admitting: Vascular Surgery

## 2018-06-13 ENCOUNTER — Ambulatory Visit (INDEPENDENT_AMBULATORY_CARE_PROVIDER_SITE_OTHER): Payer: Medicare HMO

## 2018-06-13 VITALS — BP 99/61 | HR 71 | Resp 18 | Ht 66.0 in | Wt 168.0 lb

## 2018-06-13 DIAGNOSIS — I714 Abdominal aortic aneurysm, without rupture, unspecified: Secondary | ICD-10-CM

## 2018-06-13 DIAGNOSIS — Z87891 Personal history of nicotine dependence: Secondary | ICD-10-CM | POA: Diagnosis not present

## 2018-06-13 DIAGNOSIS — I10 Essential (primary) hypertension: Secondary | ICD-10-CM | POA: Diagnosis not present

## 2018-06-13 DIAGNOSIS — E1151 Type 2 diabetes mellitus with diabetic peripheral angiopathy without gangrene: Secondary | ICD-10-CM | POA: Diagnosis not present

## 2018-06-13 DIAGNOSIS — E782 Mixed hyperlipidemia: Secondary | ICD-10-CM

## 2018-06-13 DIAGNOSIS — I2510 Atherosclerotic heart disease of native coronary artery without angina pectoris: Secondary | ICD-10-CM

## 2018-06-13 NOTE — Progress Notes (Signed)
MRN : [id number]  Nicholas Parker is a 84 y.o. ([birth year]) male who presents with chief complaint of  Chief Complaint  Patient presents with  . Follow-up    1 year EVAR  .  History of Present Illness:   The patient returns to the office for surveillance of an abdominal aortic aneurysm status post stent graft placement on 04/22/2014.   Patient denies abdominal pain or back pain, no other abdominal complaints. No groin related complaints. No symptoms consistent with distal embolization No changes in claudication distance.   There have been no interval changes in his overall healthcare since his last visit.   Patient denies amaurosis fugax or TIA symptoms. There is no history of claudication or rest pain symptoms of the lower extremities. The patient denies angina or shortness of breath.   Duplex US of the aorta and iliac arteries shows a 3.4 AAA sac with no endoleak, slight decrease in the sac compared to the previous study.  Current Meds  Medication Sig  . aspirin EC 81 MG tablet Take 81 mg by mouth daily.  Marland Kitchen glucose blood (TRUE METRIX BLOOD GLUCOSE TEST) test strip TEST TWO TIMES DAILY  . hyoscyamine (LEVSIN SL) 0.125 MG SL tablet Place under the tongue.  Marland Kitchen lisinopril (PRINIVIL,ZESTRIL) 5 MG tablet Take 5 mg by mouth daily.  Marland Kitchen loratadine (CLARITIN) 10 MG tablet Take by mouth.  . lovastatin (MEVACOR) 40 MG tablet Take 40 mg by mouth at bedtime.  . metoprolol tartrate (LOPRESSOR) 25 MG tablet Take by mouth.  . oxyCODONE-acetaminophen (PERCOCET/ROXICET) 5-325 MG tablet Take 1 tablet by mouth every 4 (four) hours as needed for severe pain.  . pantoprazole (PROTONIX) 40 MG tablet Take 40 mg by mouth daily.   . vitamin B-12 (CYANOCOBALAMIN) 500 MCG tablet Take 500 mcg by mouth daily.  . [DISCONTINUED] metoprolol succinate (TOPROL-XL) 25 MG 24 hr tablet Take 25 mg by mouth daily.    Past Medical History:  Diagnosis Date  . AAA (abdominal aortic aneurysm) ([place])   . Adenocarcinoma  ([place])   . Anemia   . B12 deficiency   . Cardiomyopathy, ischemic 10/18/2015  . Coronary artery disease   . Diabetes mellitus without complication ([place])   . Diverticulosis   . Dyspnea   . GERD (gastroesophageal reflux disease)   . Hyperlipidemia   . Hypertension   . Low testosterone   . Myocardial infarction ([place]) 10/12/2015  . neuropathy   . Neuropathy   . Skin cancer    Basal Cell  . Ventricular tachycardia, sustained ([place]) 10/18/2015    Past Surgical History:  Procedure Laterality Date  . ABDOMINAL AORTIC ANEURYSM REPAIR  2014   Dr. Delana Meyer, [facility]  . arthrosopic rotator cuff repair Left   . CARDIAC CATHETERIZATION N/A 09/17/2015   Procedure: Left Heart Cath and Coronary Angiography;  Surgeon: Wellington Hampshire, MD;  Location: [place] CV LAB;  Service: Cardiovascular;  Laterality: N/A;  . CARDIAC CATHETERIZATION N/A 09/17/2015   Procedure: Coronary Stent Intervention;  Surgeon: Wellington Hampshire, MD;  Location: [place] CV LAB;  Service: Cardiovascular;  Laterality: N/A;  . COLON SURGERY  05/2006  . COLONOSCOPY WITH PROPOFOL N/A 04/13/2015   Procedure: COLONOSCOPY WITH PROPOFOL;  Surgeon: Lollie Sails, MD;  Location: [facility] ENDOSCOPY;  Service: Endoscopy;  Laterality: N/A;  . COLONOSCOPY WITH PROPOFOL N/A 04/14/2015   Procedure: COLONOSCOPY WITH PROPOFOL;  Surgeon: Lollie Sails, MD;  Location: [facility] ENDOSCOPY;  Service: Endoscopy;  Laterality: N/A;  . COLONOSCOPY  WITH PROPOFOL N/A 07/20/2015   Procedure: COLONOSCOPY WITH PROPOFOL;  Surgeon: Lollie Sails, MD;  Location: [facility] ENDOSCOPY;  Service: Endoscopy;  Laterality: N/A;  . CORONARY ANGIOPLASTY    . ESOPHAGOGASTRODUODENOSCOPY N/A 08/29/2016   Procedure: ESOPHAGOGASTRODUODENOSCOPY (EGD) with removal of foreign body;  Surgeon: Lollie Sails, MD;  Location: [facility] ENDOSCOPY;  Service: Endoscopy;  Laterality: N/A;  . ESOPHAGOGASTRODUODENOSCOPY (EGD) WITH PROPOFOL N/A 09/29/2016   Procedure: ESOPHAGOGASTRODUODENOSCOPY  (EGD) WITH PROPOFOL;  Surgeon: Lollie Sails, MD;  Location: [facility] ENDOSCOPY;  Service: Endoscopy;  Laterality: N/A;  . EYE SURGERY Bilateral    Cataract Extraction with IOL  . LAPAROSCOPIC RIGHT COLECTOMY Right 09/14/2015   Procedure: LAPAROSCOPIC RIGHT COLECTOMY  CONVERTED TO OPEN RIGHT COLECTOMY, LYSIS OF ADHESIONS;  Surgeon: Leonie Green, MD;  Location: [facility] ORS;  Service: General;  Laterality: Right;  . robotic colectomy    . VASCULAR SURGERY      Social History Social History   Tobacco Use  . Smoking status: Former Smoker    Packs/day: 1.50    Types: Cigarettes    Last attempt to quit: 10/03/2003    Years since quitting: 14.7  . Smokeless tobacco: Never Used  Substance Use Topics  . Alcohol use: Yes    Alcohol/week: 1.0 standard drinks    Types: 1 Cans of beer per week  . Drug use: No    Family History Family History  Problem Relation Age of Onset  . CAD Mother   . Diabetes type II Mother   . Heart attack Mother   . Lung cancer Father   . Glaucoma Other     Allergies  Allergen Reactions  . Lipitor [Atorvastatin] Other (See Comments)    Reaction:  Leg cramps   . Pravachol [Pravastatin Sodium] Other (See Comments)    Reaction:  Leg cramps  . Riomet [Metformin Hcl] Diarrhea  . Vytorin [Ezetimibe-Simvastatin] Other (See Comments)    Reaction:  Leg cramps     REVIEW OF SYSTEMS (Negative unless checked)  Constitutional: [] Weight loss  [] Fever  [] Chills Cardiac: [] Chest pain   [] Chest pressure   [] Palpitations   [] Shortness of breath when laying flat   [] Shortness of breath with exertion. Vascular:  [] Pain in legs with walking   [] Pain in legs at rest  [] History of DVT   [] Phlebitis   [] Swelling in legs   [] Varicose veins   [] Non-healing ulcers Pulmonary:   [] Uses home oxygen   [] Productive cough   [] Hemoptysis   [] Wheeze  [] COPD   [] Asthma Neurologic:  [] Dizziness   [] Seizures   [] History of stroke   [] History of TIA  [] Aphasia   [] Vissual changes    [] Weakness or numbness in arm   [] Weakness or numbness in leg Musculoskeletal:   [] Joint swelling   [x] Joint pain   [] Low back pain Hematologic:  [] Easy bruising  [] Easy bleeding   [] Hypercoagulable state   [] Anemic Gastrointestinal:  [] Diarrhea   [] Vomiting  [] Gastroesophageal reflux/heartburn   [] Difficulty swallowing. Genitourinary:  [] Chronic kidney disease   [] Difficult urination  [] Frequent urination   [] Blood in urine Skin:  [] Rashes   [] Ulcers  Psychological:  [] History of anxiety   []  History of major depression.  Physical Examination  Vitals:   06/13/18 1013  BP: 99/61  Pulse: 71  Resp: 18  Weight: 168 lb (76.2 kg)  Height: 5\' 6"  (1.676 m)   Body mass index is 27.12 kg/m. Gen: WD/WN, NAD Head: [place]/AT, No temporalis wasting.  Ear/Nose/Throat: Hearing grossly intact, nares w/o  erythema or drainage Eyes: PER, EOMI, sclera nonicteric.  Neck: Supple, no large masses.   Pulmonary:  Good air movement, no audible wheezing bilaterally, no use of accessory muscles.  Cardiac: RRR, no JVD Vascular:  Popliteal impulse are normal Vessel Right Left  Radial Palpable Palpable  Brachial Palpable Palpable  Carotid Palpable Palpable  Popliteal Palpable Palpable  PT Palpable Palpable  DP Palpable Palpable  Gastrointestinal: Non-distended. No guarding/no peritoneal signs.  Musculoskeletal: M/S 5/5 throughout.  No deformity or atrophy.  Neurologic: CN 2-12 intact. Symmetrical.  Speech is fluent. Motor exam as listed above. Psychiatric: Judgment intact, Mood & affect appropriate for pt's clinical situation. Dermatologic: No rashes or ulcers noted.  No changes consistent with cellulitis. Lymph : No lichenification or skin changes of chronic lymphedema.  CBC Lab Results  Component Value Date   WBC 7.9 08/29/2016   HGB 12.9 (L) 08/29/2016   HCT 39.6 (L) 08/29/2016   MCV 79.9 (L) 08/29/2016   PLT 227 08/29/2016    BMET    Component Value Date/Time   NA 141 08/29/2016 1855   NA  141 04/23/2014 0413   K 3.9 08/29/2016 1855   K 4.6 04/23/2014 0413   CL 106 08/29/2016 1855   CL 108 (H) 04/23/2014 0413   CO2 25 08/29/2016 1855   CO2 21 04/23/2014 0413   GLUCOSE 124 (H) 08/29/2016 1855   GLUCOSE 150 (H) 04/23/2014 0413   BUN 27 (H) 08/29/2016 1855   BUN 14 04/23/2014 0413   CREATININE 1.31 (H) 08/29/2016 1855   CREATININE 1.09 04/23/2014 0413   CALCIUM 9.9 08/29/2016 1855   CALCIUM 8.3 (L) 04/23/2014 0413   GFRNONAA 50 (L) 08/29/2016 1855   GFRNONAA >60 04/23/2014 0413   GFRAA 58 (L) 08/29/2016 1855   GFRAA >60 04/23/2014 0413   CrCl cannot be calculated (Patient's most recent lab result is older than the maximum 21 days allowed.).  COAG Lab Results  Component Value Date   INR 1.11 08/29/2016   INR 1.28 09/29/2015   INR 1.1 04/23/2014    Radiology No results found.   Assessment/Plan 1. Abdominal aortic aneurysm (AAA) without rupture (HCC) Recommend: Patient is status post successful endovascular repair of the AAA.   No further intervention is required at this time.   No endoleak is detected and the aneurysm sac is stable.  The patient will continue antiplatelet therapy as prescribed as well as aggressive management of hyperlipidemia. Exercise is again strongly encouraged.   However, endografts require continued surveillance with ultrasound or CT scan. This is mandatory to detect any changes that allow repressurization of the aneurysm sac.  The patient is informed that this would be asymptomatic.  The patient is reminded that lifelong routine surveillance is a necessity with an endograft. Patient will continue to follow-up at 6 month intervals with ultrasound of the aorta.  - VAS US AORTA/IVC/ILIACS; Future  2. Arteriosclerosis of coronary artery Continue cardiac and antihypertensive medications as already ordered and reviewed, no changes at this time.  Continue statin as ordered and reviewed, no changes at this time  Nitrates PRN for chest  pain   3. Benign essential HTN Continue antihypertensive medications as already ordered, these medications have been reviewed and there are no changes at this time.   4. Type 2 diabetes mellitus with diabetic peripheral angiopathy without gangrene, without long-term current use of insulin ([place]) Continue hypoglycemic medications as already ordered, these medications have been reviewed and there are no changes at this time.  Hgb A1C  to be monitored as already arranged by primary service   5. Mixed hyperlipidemia Continue statin as ordered and reviewed, no changes at this time     Hortencia Pilar, MD  06/13/2018 10:23 AM

## 2018-08-06 DIAGNOSIS — D485 Neoplasm of uncertain behavior of skin: Secondary | ICD-10-CM | POA: Diagnosis not present

## 2018-08-06 DIAGNOSIS — Z85828 Personal history of other malignant neoplasm of skin: Secondary | ICD-10-CM | POA: Diagnosis not present

## 2018-08-06 DIAGNOSIS — Z08 Encounter for follow-up examination after completed treatment for malignant neoplasm: Secondary | ICD-10-CM | POA: Diagnosis not present

## 2018-08-06 DIAGNOSIS — X32XXXA Exposure to sunlight, initial encounter: Secondary | ICD-10-CM | POA: Diagnosis not present

## 2018-08-06 DIAGNOSIS — C44319 Basal cell carcinoma of skin of other parts of face: Secondary | ICD-10-CM | POA: Diagnosis not present

## 2018-08-06 DIAGNOSIS — L570 Actinic keratosis: Secondary | ICD-10-CM | POA: Diagnosis not present

## 2018-08-06 DIAGNOSIS — L218 Other seborrheic dermatitis: Secondary | ICD-10-CM | POA: Diagnosis not present

## 2018-08-06 DIAGNOSIS — L821 Other seborrheic keratosis: Secondary | ICD-10-CM | POA: Diagnosis not present

## 2018-09-13 DIAGNOSIS — C44319 Basal cell carcinoma of skin of other parts of face: Secondary | ICD-10-CM | POA: Diagnosis not present

## 2018-09-18 DIAGNOSIS — E118 Type 2 diabetes mellitus with unspecified complications: Secondary | ICD-10-CM | POA: Diagnosis not present

## 2018-09-18 DIAGNOSIS — D518 Other vitamin B12 deficiency anemias: Secondary | ICD-10-CM | POA: Diagnosis not present

## 2018-09-18 DIAGNOSIS — E782 Mixed hyperlipidemia: Secondary | ICD-10-CM | POA: Diagnosis not present

## 2018-09-25 DIAGNOSIS — I255 Ischemic cardiomyopathy: Secondary | ICD-10-CM | POA: Diagnosis not present

## 2018-09-25 DIAGNOSIS — K219 Gastro-esophageal reflux disease without esophagitis: Secondary | ICD-10-CM | POA: Diagnosis not present

## 2018-09-25 DIAGNOSIS — I10 Essential (primary) hypertension: Secondary | ICD-10-CM | POA: Diagnosis not present

## 2018-09-25 DIAGNOSIS — E1122 Type 2 diabetes mellitus with diabetic chronic kidney disease: Secondary | ICD-10-CM | POA: Diagnosis not present

## 2018-09-25 DIAGNOSIS — I2510 Atherosclerotic heart disease of native coronary artery without angina pectoris: Secondary | ICD-10-CM | POA: Diagnosis not present

## 2018-09-25 DIAGNOSIS — Z0000 Encounter for general adult medical examination without abnormal findings: Secondary | ICD-10-CM | POA: Diagnosis not present

## 2018-09-25 DIAGNOSIS — E118 Type 2 diabetes mellitus with unspecified complications: Secondary | ICD-10-CM | POA: Diagnosis not present

## 2018-09-25 DIAGNOSIS — D692 Other nonthrombocytopenic purpura: Secondary | ICD-10-CM | POA: Diagnosis not present

## 2018-09-25 DIAGNOSIS — B351 Tinea unguium: Secondary | ICD-10-CM | POA: Insufficient documentation

## 2018-09-25 DIAGNOSIS — I5022 Chronic systolic (congestive) heart failure: Secondary | ICD-10-CM | POA: Diagnosis not present

## 2018-09-25 DIAGNOSIS — I714 Abdominal aortic aneurysm, without rupture: Secondary | ICD-10-CM | POA: Diagnosis not present

## 2018-10-23 DIAGNOSIS — I255 Ischemic cardiomyopathy: Secondary | ICD-10-CM | POA: Diagnosis not present

## 2018-10-23 DIAGNOSIS — I10 Essential (primary) hypertension: Secondary | ICD-10-CM | POA: Diagnosis not present

## 2018-10-23 DIAGNOSIS — I25118 Atherosclerotic heart disease of native coronary artery with other forms of angina pectoris: Secondary | ICD-10-CM | POA: Diagnosis not present

## 2018-10-23 DIAGNOSIS — I714 Abdominal aortic aneurysm, without rupture: Secondary | ICD-10-CM | POA: Diagnosis not present

## 2018-11-12 DIAGNOSIS — I25118 Atherosclerotic heart disease of native coronary artery with other forms of angina pectoris: Secondary | ICD-10-CM | POA: Diagnosis not present

## 2018-11-21 DIAGNOSIS — E782 Mixed hyperlipidemia: Secondary | ICD-10-CM | POA: Diagnosis not present

## 2018-11-21 DIAGNOSIS — I2510 Atherosclerotic heart disease of native coronary artery without angina pectoris: Secondary | ICD-10-CM | POA: Diagnosis not present

## 2018-11-21 DIAGNOSIS — I10 Essential (primary) hypertension: Secondary | ICD-10-CM | POA: Diagnosis not present

## 2018-11-21 DIAGNOSIS — I5022 Chronic systolic (congestive) heart failure: Secondary | ICD-10-CM | POA: Diagnosis not present

## 2019-03-14 DIAGNOSIS — D3131 Benign neoplasm of right choroid: Secondary | ICD-10-CM | POA: Diagnosis not present

## 2019-03-24 DIAGNOSIS — E782 Mixed hyperlipidemia: Secondary | ICD-10-CM | POA: Diagnosis not present

## 2019-03-24 DIAGNOSIS — N183 Chronic kidney disease, stage 3 (moderate): Secondary | ICD-10-CM | POA: Diagnosis not present

## 2019-03-24 DIAGNOSIS — Z79899 Other long term (current) drug therapy: Secondary | ICD-10-CM | POA: Diagnosis not present

## 2019-03-24 DIAGNOSIS — I10 Essential (primary) hypertension: Secondary | ICD-10-CM | POA: Diagnosis not present

## 2019-03-24 DIAGNOSIS — I2510 Atherosclerotic heart disease of native coronary artery without angina pectoris: Secondary | ICD-10-CM | POA: Diagnosis not present

## 2019-03-24 DIAGNOSIS — E1122 Type 2 diabetes mellitus with diabetic chronic kidney disease: Secondary | ICD-10-CM | POA: Diagnosis not present

## 2019-03-24 DIAGNOSIS — D518 Other vitamin B12 deficiency anemias: Secondary | ICD-10-CM | POA: Diagnosis not present

## 2019-03-24 DIAGNOSIS — D692 Other nonthrombocytopenic purpura: Secondary | ICD-10-CM | POA: Diagnosis not present

## 2019-03-31 DIAGNOSIS — E1122 Type 2 diabetes mellitus with diabetic chronic kidney disease: Secondary | ICD-10-CM | POA: Diagnosis not present

## 2019-03-31 DIAGNOSIS — I5022 Chronic systolic (congestive) heart failure: Secondary | ICD-10-CM | POA: Diagnosis not present

## 2019-03-31 DIAGNOSIS — I10 Essential (primary) hypertension: Secondary | ICD-10-CM | POA: Diagnosis not present

## 2019-03-31 DIAGNOSIS — I2510 Atherosclerotic heart disease of native coronary artery without angina pectoris: Secondary | ICD-10-CM | POA: Diagnosis not present

## 2019-03-31 DIAGNOSIS — D692 Other nonthrombocytopenic purpura: Secondary | ICD-10-CM | POA: Diagnosis not present

## 2019-03-31 DIAGNOSIS — I714 Abdominal aortic aneurysm, without rupture: Secondary | ICD-10-CM | POA: Diagnosis not present

## 2019-03-31 DIAGNOSIS — I255 Ischemic cardiomyopathy: Secondary | ICD-10-CM | POA: Diagnosis not present

## 2019-03-31 DIAGNOSIS — E118 Type 2 diabetes mellitus with unspecified complications: Secondary | ICD-10-CM | POA: Diagnosis not present

## 2019-03-31 DIAGNOSIS — K219 Gastro-esophageal reflux disease without esophagitis: Secondary | ICD-10-CM | POA: Diagnosis not present

## 2019-05-19 DIAGNOSIS — L218 Other seborrheic dermatitis: Secondary | ICD-10-CM | POA: Diagnosis not present

## 2019-05-19 DIAGNOSIS — L298 Other pruritus: Secondary | ICD-10-CM | POA: Diagnosis not present

## 2019-05-19 DIAGNOSIS — L570 Actinic keratosis: Secondary | ICD-10-CM | POA: Diagnosis not present

## 2019-05-19 DIAGNOSIS — Z08 Encounter for follow-up examination after completed treatment for malignant neoplasm: Secondary | ICD-10-CM | POA: Diagnosis not present

## 2019-05-19 DIAGNOSIS — B078 Other viral warts: Secondary | ICD-10-CM | POA: Diagnosis not present

## 2019-05-19 DIAGNOSIS — D485 Neoplasm of uncertain behavior of skin: Secondary | ICD-10-CM | POA: Diagnosis not present

## 2019-05-19 DIAGNOSIS — Z85828 Personal history of other malignant neoplasm of skin: Secondary | ICD-10-CM | POA: Diagnosis not present

## 2019-06-16 ENCOUNTER — Ambulatory Visit (INDEPENDENT_AMBULATORY_CARE_PROVIDER_SITE_OTHER): Payer: Medicare HMO

## 2019-06-16 ENCOUNTER — Encounter (INDEPENDENT_AMBULATORY_CARE_PROVIDER_SITE_OTHER): Payer: Self-pay

## 2019-06-16 ENCOUNTER — Ambulatory Visit (INDEPENDENT_AMBULATORY_CARE_PROVIDER_SITE_OTHER): Payer: Medicare HMO | Admitting: Vascular Surgery

## 2019-06-16 ENCOUNTER — Other Ambulatory Visit: Payer: Self-pay

## 2019-06-16 ENCOUNTER — Encounter (INDEPENDENT_AMBULATORY_CARE_PROVIDER_SITE_OTHER): Payer: Self-pay | Admitting: Vascular Surgery

## 2019-06-16 VITALS — BP 121/71 | HR 85 | Resp 14 | Ht 66.0 in | Wt 165.0 lb

## 2019-06-16 DIAGNOSIS — E782 Mixed hyperlipidemia: Secondary | ICD-10-CM

## 2019-06-16 DIAGNOSIS — I714 Abdominal aortic aneurysm, without rupture, unspecified: Secondary | ICD-10-CM

## 2019-06-16 DIAGNOSIS — I2510 Atherosclerotic heart disease of native coronary artery without angina pectoris: Secondary | ICD-10-CM | POA: Insufficient documentation

## 2019-06-16 DIAGNOSIS — I255 Ischemic cardiomyopathy: Secondary | ICD-10-CM | POA: Diagnosis not present

## 2019-06-16 DIAGNOSIS — D519 Vitamin B12 deficiency anemia, unspecified: Secondary | ICD-10-CM | POA: Insufficient documentation

## 2019-06-16 DIAGNOSIS — R7989 Other specified abnormal findings of blood chemistry: Secondary | ICD-10-CM | POA: Insufficient documentation

## 2019-06-16 DIAGNOSIS — I10 Essential (primary) hypertension: Secondary | ICD-10-CM

## 2019-06-16 DIAGNOSIS — D649 Anemia, unspecified: Secondary | ICD-10-CM | POA: Insufficient documentation

## 2019-06-16 DIAGNOSIS — E1142 Type 2 diabetes mellitus with diabetic polyneuropathy: Secondary | ICD-10-CM | POA: Insufficient documentation

## 2019-06-16 DIAGNOSIS — K219 Gastro-esophageal reflux disease without esophagitis: Secondary | ICD-10-CM | POA: Insufficient documentation

## 2019-06-16 DIAGNOSIS — E1151 Type 2 diabetes mellitus with diabetic peripheral angiopathy without gangrene: Secondary | ICD-10-CM

## 2019-06-16 NOTE — Progress Notes (Signed)
MRN : [id number]  Nicholas Parker is a 85 y.o. (July 10, 1934) male who presents with chief complaint of No chief complaint on file. Marland Kitchen  History of Present Illness:   The patient returns to the office for surveillance of an abdominal aortic aneurysm status post stent graft placement on 04/22/2014.   Patient denies abdominal pain or back pain, no other abdominal complaints. No groin related complaints. No symptoms consistent with distal embolization No changes in claudication distance.   There have been no interval changes in his overall healthcare since his last visit.   Patient denies amaurosis fugax or TIA symptoms. There is no history of claudication or rest pain symptoms of the lower extremities. The patient denies angina or shortness of breath.   Duplex US of the aorta and iliac arteries shows a 3.2 AAA sac with no endoleak, slight decrease in the sac compared to the previous study.  No outpatient medications have been marked as taking for the 06/16/19 encounter (Appointment) with Delana Meyer, Dolores Lory, MD.    Past Medical History:  Diagnosis Date  . AAA (abdominal aortic aneurysm) ([place])   . Adenocarcinoma ([place])   . Anemia   . B12 deficiency   . Cardiomyopathy, ischemic 10/18/2015  . Coronary artery disease   . Diabetes mellitus without complication ([place])   . Diverticulosis   . Dyspnea   . GERD (gastroesophageal reflux disease)   . Hyperlipidemia   . Hypertension   . Low testosterone   . Myocardial infarction ([place]) 10/12/2015  . neuropathy   . Neuropathy   . Skin cancer    Basal Cell  . Ventricular tachycardia, sustained ([place]) 10/18/2015    Past Surgical History:  Procedure Laterality Date  . ABDOMINAL AORTIC ANEURYSM REPAIR  2014   Dr. Delana Meyer, [facility]  . arthrosopic rotator cuff repair Left   . CARDIAC CATHETERIZATION N/A 09/17/2015   Procedure: Left Heart Cath and Coronary Angiography;  Surgeon: Wellington Hampshire, MD;  Location: [place] CV LAB;  Service:  Cardiovascular;  Laterality: N/A;  . CARDIAC CATHETERIZATION N/A 09/17/2015   Procedure: Coronary Stent Intervention;  Surgeon: Wellington Hampshire, MD;  Location: [place] CV LAB;  Service: Cardiovascular;  Laterality: N/A;  . COLON SURGERY  05/2006  . COLONOSCOPY WITH PROPOFOL N/A 04/13/2015   Procedure: COLONOSCOPY WITH PROPOFOL;  Surgeon: Lollie Sails, MD;  Location: [facility] ENDOSCOPY;  Service: Endoscopy;  Laterality: N/A;  . COLONOSCOPY WITH PROPOFOL N/A 04/14/2015   Procedure: COLONOSCOPY WITH PROPOFOL;  Surgeon: Lollie Sails, MD;  Location: [facility] ENDOSCOPY;  Service: Endoscopy;  Laterality: N/A;  . COLONOSCOPY WITH PROPOFOL N/A 07/20/2015   Procedure: COLONOSCOPY WITH PROPOFOL;  Surgeon: Lollie Sails, MD;  Location: [facility] ENDOSCOPY;  Service: Endoscopy;  Laterality: N/A;  . CORONARY ANGIOPLASTY    . ESOPHAGOGASTRODUODENOSCOPY N/A 08/29/2016   Procedure: ESOPHAGOGASTRODUODENOSCOPY (EGD) with removal of foreign body;  Surgeon: Lollie Sails, MD;  Location: [facility] ENDOSCOPY;  Service: Endoscopy;  Laterality: N/A;  . ESOPHAGOGASTRODUODENOSCOPY (EGD) WITH PROPOFOL N/A 09/29/2016   Procedure: ESOPHAGOGASTRODUODENOSCOPY (EGD) WITH PROPOFOL;  Surgeon: Lollie Sails, MD;  Location: [facility] ENDOSCOPY;  Service: Endoscopy;  Laterality: N/A;  . EYE SURGERY Bilateral    Cataract Extraction with IOL  . LAPAROSCOPIC RIGHT COLECTOMY Right 09/14/2015   Procedure: LAPAROSCOPIC RIGHT COLECTOMY  CONVERTED TO OPEN RIGHT COLECTOMY, LYSIS OF ADHESIONS;  Surgeon: Leonie Green, MD;  Location: [facility] ORS;  Service: General;  Laterality: Right;  . robotic colectomy    . VASCULAR SURGERY  Social History Social History   Tobacco Use  . Smoking status: Former Smoker    Packs/day: 1.50    Types: Cigarettes    Quit date: 10/03/2003    Years since quitting: 15.7  . Smokeless tobacco: Never Used  Substance Use Topics  . Alcohol use: Yes    Alcohol/week: 1.0 standard drinks    Types: 1 Cans of  beer per week  . Drug use: No    Family History Family History  Problem Relation Age of Onset  . CAD Mother   . Diabetes type II Mother   . Heart attack Mother   . Lung cancer Father   . Glaucoma Other     Allergies  Allergen Reactions  . Lipitor [Atorvastatin] Other (See Comments)    Reaction:  Leg cramps   . Pravachol [Pravastatin Sodium] Other (See Comments)    Reaction:  Leg cramps  . Riomet [Metformin Hcl] Diarrhea  . Vytorin [Ezetimibe-Simvastatin] Other (See Comments)    Reaction:  Leg cramps     REVIEW OF SYSTEMS (Negative unless checked)  Constitutional: [] Weight loss  [] Fever  [] Chills Cardiac: [] Chest pain   [] Chest pressure   [] Palpitations   [] Shortness of breath when laying flat   [] Shortness of breath with exertion. Vascular:  [] Pain in legs with walking   [] Pain in legs at rest  [] History of DVT   [] Phlebitis   [] Swelling in legs   [] Varicose veins   [] Non-healing ulcers Pulmonary:   [] Uses home oxygen   [] Productive cough   [] Hemoptysis   [] Wheeze  [] COPD   [] Asthma Neurologic:  [] Dizziness   [] Seizures   [] History of stroke   [] History of TIA  [] Aphasia   [] Vissual changes   [] Weakness or numbness in arm   [x] Weakness or numbness in leg Musculoskeletal:   [] Joint swelling   [x] Joint pain   [x] Low back pain Hematologic:  [] Easy bruising  [] Easy bleeding   [] Hypercoagulable state   [] Anemic Gastrointestinal:  [] Diarrhea   [] Vomiting  [x] Gastroesophageal reflux/heartburn   [] Difficulty swallowing. Genitourinary:  [] Chronic kidney disease   [] Difficult urination  [] Frequent urination   [] Blood in urine Skin:  [] Rashes   [] Ulcers  Psychological:  [] History of anxiety   []  History of major depression.  Physical Examination  There were no vitals filed for this visit. There is no height or weight on file to calculate BMI. Gen: WD/WN, NAD Head: [place]/AT, No temporalis wasting.  Ear/Nose/Throat: Hearing grossly intact, nares w/o erythema or drainage Eyes: PER,  EOMI, sclera nonicteric.  Neck: Supple, no large masses.   Pulmonary:  Good air movement, no audible wheezing bilaterally, no use of accessory muscles.  Cardiac: RRR, no JVD Vascular:  No carotid bruits Vessel Right Left  Radial Palpable Palpable  Brachial Palpable Palpable  Carotid Palpable Palpable  Popliteal Palpable Palpable  Gastrointestinal: Non-distended. No guarding/no peritoneal signs.  Musculoskeletal: M/S 5/5 throughout.  No deformity or atrophy.  Neurologic: CN 2-12 intact. Symmetrical.  Speech is fluent. Motor exam as listed above. Psychiatric: Judgment intact, Mood & affect appropriate for pt's clinical situation. Dermatologic: No rashes or ulcers noted.  No changes consistent with cellulitis. Lymph : No lichenification or skin changes of chronic lymphedema.  CBC Lab Results  Component Value Date   WBC 7.9 08/29/2016   HGB 12.9 (L) 08/29/2016   HCT 39.6 (L) 08/29/2016   MCV 79.9 (L) 08/29/2016   PLT 227 08/29/2016    BMET    Component Value Date/Time   NA 141 08/29/2016 1855  NA 141 04/23/2014 0413   K 3.9 08/29/2016 1855   K 4.6 04/23/2014 0413   CL 106 08/29/2016 1855   CL 108 (H) 04/23/2014 0413   CO2 25 08/29/2016 1855   CO2 21 04/23/2014 0413   GLUCOSE 124 (H) 08/29/2016 1855   GLUCOSE 150 (H) 04/23/2014 0413   BUN 27 (H) 08/29/2016 1855   BUN 14 04/23/2014 0413   CREATININE 1.31 (H) 08/29/2016 1855   CREATININE 1.09 04/23/2014 0413   CALCIUM 9.9 08/29/2016 1855   CALCIUM 8.3 (L) 04/23/2014 0413   GFRNONAA 50 (L) 08/29/2016 1855   GFRNONAA >60 04/23/2014 0413   GFRAA 58 (L) 08/29/2016 1855   GFRAA >60 04/23/2014 0413   CrCl cannot be calculated (Patient's most recent lab result is older than the maximum 21 days allowed.).  COAG Lab Results  Component Value Date   INR 1.11 08/29/2016   INR 1.28 09/29/2015   INR 1.1 04/23/2014    Radiology No results found.   Assessment/Plan 1. Abdominal aortic aneurysm (AAA) without rupture [facility])   Recommend: Patient is status post successful endovascular repair of the AAA on 04/22/2014.  Marland Kitchen   No further intervention is required at this time.   No endoleak is detected and the aneurysm sac is stable.  The patient will continue antiplatelet therapy as prescribed as well as aggressive management of hyperlipidemia. Exercise is again strongly encouraged.   However, endografts require continued surveillance with ultrasound or CT scan. This is mandatory to detect any changes that allow repressurization of the aneurysm sac.  The patient is informed that this would be asymptomatic.  The patient is reminded that lifelong routine surveillance is a necessity with an endograft. Patient will continue to follow-up at 6 month intervals with ultrasound of the aorta. - VAS US AORTA/IVC/ILIACS; Future  2. Cardiomyopathy, ischemic Continue cardiac and antihypertensive medications as already ordered and reviewed, no changes at this time.  Continue statin as ordered and reviewed, no changes at this time  Nitrates PRN for chest pain   3. Benign essential HTN Continue antihypertensive medications as already ordered, these medications have been reviewed and there are no changes at this time.   4. Type 2 diabetes mellitus with diabetic peripheral angiopathy without gangrene, without long-term current use of insulin ([place]) Continue hypoglycemic medications as already ordered, these medications have been reviewed and there are no changes at this time.  Hgb A1C to be monitored as already arranged by primary service   5. Mixed hyperlipidemia Continue statin as ordered and reviewed, no changes at this time    Hortencia Pilar, MD  06/16/2019 9:45 AM

## 2019-07-16 DIAGNOSIS — I10 Essential (primary) hypertension: Secondary | ICD-10-CM | POA: Diagnosis not present

## 2019-07-16 DIAGNOSIS — I714 Abdominal aortic aneurysm, without rupture: Secondary | ICD-10-CM | POA: Diagnosis not present

## 2019-07-16 DIAGNOSIS — I2510 Atherosclerotic heart disease of native coronary artery without angina pectoris: Secondary | ICD-10-CM | POA: Diagnosis not present

## 2019-07-16 DIAGNOSIS — I5022 Chronic systolic (congestive) heart failure: Secondary | ICD-10-CM | POA: Diagnosis not present

## 2019-07-16 DIAGNOSIS — E1142 Type 2 diabetes mellitus with diabetic polyneuropathy: Secondary | ICD-10-CM | POA: Diagnosis not present

## 2019-07-16 DIAGNOSIS — E782 Mixed hyperlipidemia: Secondary | ICD-10-CM | POA: Diagnosis not present

## 2019-07-16 DIAGNOSIS — I255 Ischemic cardiomyopathy: Secondary | ICD-10-CM | POA: Diagnosis not present

## 2019-07-31 DIAGNOSIS — I5022 Chronic systolic (congestive) heart failure: Secondary | ICD-10-CM | POA: Diagnosis not present

## 2019-07-31 DIAGNOSIS — I2510 Atherosclerotic heart disease of native coronary artery without angina pectoris: Secondary | ICD-10-CM | POA: Diagnosis not present

## 2019-07-31 DIAGNOSIS — I255 Ischemic cardiomyopathy: Secondary | ICD-10-CM | POA: Diagnosis not present

## 2019-09-24 DIAGNOSIS — D649 Anemia, unspecified: Secondary | ICD-10-CM | POA: Diagnosis not present

## 2019-09-24 DIAGNOSIS — E1122 Type 2 diabetes mellitus with diabetic chronic kidney disease: Secondary | ICD-10-CM | POA: Diagnosis not present

## 2019-09-24 DIAGNOSIS — E782 Mixed hyperlipidemia: Secondary | ICD-10-CM | POA: Diagnosis not present

## 2019-09-24 DIAGNOSIS — I10 Essential (primary) hypertension: Secondary | ICD-10-CM | POA: Diagnosis not present

## 2019-09-24 DIAGNOSIS — N183 Chronic kidney disease, stage 3 unspecified: Secondary | ICD-10-CM | POA: Diagnosis not present

## 2019-09-24 DIAGNOSIS — D518 Other vitamin B12 deficiency anemias: Secondary | ICD-10-CM | POA: Diagnosis not present

## 2019-10-01 DIAGNOSIS — I110 Hypertensive heart disease with heart failure: Secondary | ICD-10-CM | POA: Diagnosis not present

## 2019-10-01 DIAGNOSIS — N1831 Chronic kidney disease, stage 3a: Secondary | ICD-10-CM | POA: Diagnosis not present

## 2019-10-01 DIAGNOSIS — I25118 Atherosclerotic heart disease of native coronary artery with other forms of angina pectoris: Secondary | ICD-10-CM | POA: Diagnosis not present

## 2019-10-01 DIAGNOSIS — E1122 Type 2 diabetes mellitus with diabetic chronic kidney disease: Secondary | ICD-10-CM | POA: Diagnosis not present

## 2019-10-01 DIAGNOSIS — D692 Other nonthrombocytopenic purpura: Secondary | ICD-10-CM | POA: Diagnosis not present

## 2019-10-01 DIAGNOSIS — Z0000 Encounter for general adult medical examination without abnormal findings: Secondary | ICD-10-CM | POA: Diagnosis not present

## 2019-10-01 DIAGNOSIS — I5022 Chronic systolic (congestive) heart failure: Secondary | ICD-10-CM | POA: Diagnosis not present

## 2019-10-01 DIAGNOSIS — I714 Abdominal aortic aneurysm, without rupture: Secondary | ICD-10-CM | POA: Diagnosis not present

## 2019-10-01 DIAGNOSIS — K219 Gastro-esophageal reflux disease without esophagitis: Secondary | ICD-10-CM | POA: Diagnosis not present

## 2019-10-24 DIAGNOSIS — E1122 Type 2 diabetes mellitus with diabetic chronic kidney disease: Secondary | ICD-10-CM | POA: Diagnosis not present

## 2019-10-24 DIAGNOSIS — I2510 Atherosclerotic heart disease of native coronary artery without angina pectoris: Secondary | ICD-10-CM | POA: Diagnosis not present

## 2019-10-24 DIAGNOSIS — E1151 Type 2 diabetes mellitus with diabetic peripheral angiopathy without gangrene: Secondary | ICD-10-CM | POA: Diagnosis not present

## 2019-10-24 DIAGNOSIS — I130 Hypertensive heart and chronic kidney disease with heart failure and stage 1 through stage 4 chronic kidney disease, or unspecified chronic kidney disease: Secondary | ICD-10-CM | POA: Diagnosis not present

## 2019-10-24 DIAGNOSIS — I5022 Chronic systolic (congestive) heart failure: Secondary | ICD-10-CM | POA: Diagnosis not present

## 2019-10-24 DIAGNOSIS — Z79899 Other long term (current) drug therapy: Secondary | ICD-10-CM | POA: Diagnosis not present

## 2019-10-24 DIAGNOSIS — D649 Anemia, unspecified: Secondary | ICD-10-CM | POA: Diagnosis not present

## 2019-10-28 DIAGNOSIS — E559 Vitamin D deficiency, unspecified: Secondary | ICD-10-CM | POA: Diagnosis not present

## 2019-10-28 DIAGNOSIS — Z7189 Other specified counseling: Secondary | ICD-10-CM | POA: Diagnosis not present

## 2019-10-28 DIAGNOSIS — G20 Parkinson's disease: Secondary | ICD-10-CM | POA: Diagnosis not present

## 2019-10-28 DIAGNOSIS — G4752 REM sleep behavior disorder: Secondary | ICD-10-CM | POA: Diagnosis not present

## 2019-10-28 DIAGNOSIS — E538 Deficiency of other specified B group vitamins: Secondary | ICD-10-CM | POA: Diagnosis not present

## 2019-10-29 DIAGNOSIS — G20 Parkinson's disease: Secondary | ICD-10-CM | POA: Insufficient documentation

## 2019-11-10 ENCOUNTER — Other Ambulatory Visit: Payer: Self-pay | Admitting: Neurology

## 2019-11-10 ENCOUNTER — Other Ambulatory Visit (HOSPITAL_COMMUNITY): Payer: Self-pay | Admitting: Neurology

## 2019-11-10 DIAGNOSIS — G20 Parkinson's disease: Secondary | ICD-10-CM

## 2019-11-20 ENCOUNTER — Other Ambulatory Visit: Payer: Self-pay

## 2019-11-20 ENCOUNTER — Ambulatory Visit
Admission: RE | Admit: 2019-11-20 | Discharge: 2019-11-20 | Disposition: A | Discharge status: Home / Self Care | Payer: Medicare HMO | Source: Ambulatory Visit | Attending: Neurology | Admitting: Neurology

## 2019-11-20 DIAGNOSIS — G20 Parkinson's disease: Secondary | ICD-10-CM | POA: Insufficient documentation

## 2019-12-04 DIAGNOSIS — F418 Other specified anxiety disorders: Secondary | ICD-10-CM | POA: Diagnosis not present

## 2019-12-04 DIAGNOSIS — G20 Parkinson's disease: Secondary | ICD-10-CM | POA: Diagnosis not present

## 2019-12-16 DIAGNOSIS — E782 Mixed hyperlipidemia: Secondary | ICD-10-CM | POA: Diagnosis not present

## 2019-12-16 DIAGNOSIS — R7989 Other specified abnormal findings of blood chemistry: Secondary | ICD-10-CM | POA: Diagnosis not present

## 2019-12-16 DIAGNOSIS — Z79899 Other long term (current) drug therapy: Secondary | ICD-10-CM | POA: Diagnosis not present

## 2019-12-16 DIAGNOSIS — N183 Chronic kidney disease, stage 3 unspecified: Secondary | ICD-10-CM | POA: Diagnosis not present

## 2019-12-16 DIAGNOSIS — E1122 Type 2 diabetes mellitus with diabetic chronic kidney disease: Secondary | ICD-10-CM | POA: Diagnosis not present

## 2019-12-16 DIAGNOSIS — D518 Other vitamin B12 deficiency anemias: Secondary | ICD-10-CM | POA: Diagnosis not present

## 2019-12-16 DIAGNOSIS — D649 Anemia, unspecified: Secondary | ICD-10-CM | POA: Diagnosis not present

## 2020-01-20 DIAGNOSIS — E1122 Type 2 diabetes mellitus with diabetic chronic kidney disease: Secondary | ICD-10-CM | POA: Diagnosis not present

## 2020-01-20 DIAGNOSIS — E1151 Type 2 diabetes mellitus with diabetic peripheral angiopathy without gangrene: Secondary | ICD-10-CM | POA: Diagnosis not present

## 2020-01-20 DIAGNOSIS — I2510 Atherosclerotic heart disease of native coronary artery without angina pectoris: Secondary | ICD-10-CM | POA: Diagnosis not present

## 2020-01-20 DIAGNOSIS — B351 Tinea unguium: Secondary | ICD-10-CM | POA: Diagnosis not present

## 2020-01-20 DIAGNOSIS — E1142 Type 2 diabetes mellitus with diabetic polyneuropathy: Secondary | ICD-10-CM | POA: Diagnosis not present

## 2020-01-20 DIAGNOSIS — N183 Chronic kidney disease, stage 3 unspecified: Secondary | ICD-10-CM | POA: Diagnosis not present

## 2020-01-20 DIAGNOSIS — I130 Hypertensive heart and chronic kidney disease with heart failure and stage 1 through stage 4 chronic kidney disease, or unspecified chronic kidney disease: Secondary | ICD-10-CM | POA: Diagnosis not present

## 2020-01-20 DIAGNOSIS — I5022 Chronic systolic (congestive) heart failure: Secondary | ICD-10-CM | POA: Diagnosis not present

## 2020-01-20 DIAGNOSIS — E1169 Type 2 diabetes mellitus with other specified complication: Secondary | ICD-10-CM | POA: Diagnosis not present

## 2020-02-02 DIAGNOSIS — I255 Ischemic cardiomyopathy: Secondary | ICD-10-CM | POA: Diagnosis not present

## 2020-02-02 DIAGNOSIS — I2510 Atherosclerotic heart disease of native coronary artery without angina pectoris: Secondary | ICD-10-CM | POA: Diagnosis not present

## 2020-02-02 DIAGNOSIS — I714 Abdominal aortic aneurysm, without rupture: Secondary | ICD-10-CM | POA: Diagnosis not present

## 2020-02-02 DIAGNOSIS — I5022 Chronic systolic (congestive) heart failure: Secondary | ICD-10-CM | POA: Diagnosis not present

## 2020-02-02 DIAGNOSIS — E782 Mixed hyperlipidemia: Secondary | ICD-10-CM | POA: Diagnosis not present

## 2020-02-02 DIAGNOSIS — I10 Essential (primary) hypertension: Secondary | ICD-10-CM | POA: Diagnosis not present

## 2020-03-03 DIAGNOSIS — G20 Parkinson's disease: Secondary | ICD-10-CM | POA: Diagnosis not present

## 2020-03-03 DIAGNOSIS — G4752 REM sleep behavior disorder: Secondary | ICD-10-CM | POA: Diagnosis not present

## 2020-03-15 DIAGNOSIS — E119 Type 2 diabetes mellitus without complications: Secondary | ICD-10-CM | POA: Diagnosis not present

## 2020-05-05 DIAGNOSIS — C44519 Basal cell carcinoma of skin of other part of trunk: Secondary | ICD-10-CM | POA: Diagnosis not present

## 2020-05-05 DIAGNOSIS — D2261 Melanocytic nevi of right upper limb, including shoulder: Secondary | ICD-10-CM | POA: Diagnosis not present

## 2020-05-05 DIAGNOSIS — Z85828 Personal history of other malignant neoplasm of skin: Secondary | ICD-10-CM | POA: Diagnosis not present

## 2020-05-05 DIAGNOSIS — D485 Neoplasm of uncertain behavior of skin: Secondary | ICD-10-CM | POA: Diagnosis not present

## 2020-05-05 DIAGNOSIS — L570 Actinic keratosis: Secondary | ICD-10-CM | POA: Diagnosis not present

## 2020-05-05 DIAGNOSIS — D0462 Carcinoma in situ of skin of left upper limb, including shoulder: Secondary | ICD-10-CM | POA: Diagnosis not present

## 2020-05-05 DIAGNOSIS — D225 Melanocytic nevi of trunk: Secondary | ICD-10-CM | POA: Diagnosis not present

## 2020-05-05 DIAGNOSIS — L821 Other seborrheic keratosis: Secondary | ICD-10-CM | POA: Diagnosis not present

## 2020-05-05 DIAGNOSIS — D2262 Melanocytic nevi of left upper limb, including shoulder: Secondary | ICD-10-CM | POA: Diagnosis not present

## 2020-05-05 DIAGNOSIS — C4441 Basal cell carcinoma of skin of scalp and neck: Secondary | ICD-10-CM | POA: Diagnosis not present

## 2020-05-05 DIAGNOSIS — X32XXXA Exposure to sunlight, initial encounter: Secondary | ICD-10-CM | POA: Diagnosis not present

## 2020-06-08 DIAGNOSIS — D0462 Carcinoma in situ of skin of left upper limb, including shoulder: Secondary | ICD-10-CM | POA: Diagnosis not present

## 2020-06-17 ENCOUNTER — Other Ambulatory Visit: Payer: Self-pay

## 2020-06-17 ENCOUNTER — Ambulatory Visit (INDEPENDENT_AMBULATORY_CARE_PROVIDER_SITE_OTHER): Payer: Medicare HMO

## 2020-06-17 ENCOUNTER — Ambulatory Visit (INDEPENDENT_AMBULATORY_CARE_PROVIDER_SITE_OTHER): Payer: Medicare HMO | Admitting: Vascular Surgery

## 2020-06-17 ENCOUNTER — Encounter (INDEPENDENT_AMBULATORY_CARE_PROVIDER_SITE_OTHER): Payer: Self-pay | Admitting: Vascular Surgery

## 2020-06-17 VITALS — BP 104/62 | HR 74 | Resp 16 | Wt 145.6 lb

## 2020-06-17 DIAGNOSIS — E1151 Type 2 diabetes mellitus with diabetic peripheral angiopathy without gangrene: Secondary | ICD-10-CM

## 2020-06-17 DIAGNOSIS — I25118 Atherosclerotic heart disease of native coronary artery with other forms of angina pectoris: Secondary | ICD-10-CM

## 2020-06-17 DIAGNOSIS — E782 Mixed hyperlipidemia: Secondary | ICD-10-CM

## 2020-06-17 DIAGNOSIS — I714 Abdominal aortic aneurysm, without rupture, unspecified: Secondary | ICD-10-CM

## 2020-06-17 DIAGNOSIS — I10 Essential (primary) hypertension: Secondary | ICD-10-CM

## 2020-06-20 ENCOUNTER — Encounter (INDEPENDENT_AMBULATORY_CARE_PROVIDER_SITE_OTHER): Payer: Self-pay | Admitting: Vascular Surgery

## 2020-06-20 NOTE — Progress Notes (Signed)
MRN : [id number]  Nicholas Parker is a 86 y.o. (01/01/1934) male who presents with chief complaint of  Chief Complaint  Patient presents with   Follow-up    ultrasound follow up  .  History of Present Illness:   The patient returns to the office for surveillance of an abdominal aortic aneurysm status post stent graft placement on8/26/2015.   Patient denies abdominal pain or back pain, no other abdominal complaints. No groin related complaints. No symptoms consistent with distal embolization No changes in claudication distance.   There have been no interval changes in his overall healthcare since his last visit.   Patient denies amaurosis fugax or TIA symptoms. There is no history of claudication or rest pain symptoms of the lower extremities. The patient denies angina or shortness of breath.   Duplex US of the aorta and iliac arteries shows a3.6AAA sac with noendoleak, slight increasein the sac compared to the previous study (3.6 cm by previous scan).  Current Meds  Medication Sig   aspirin EC 81 MG tablet Take 81 mg by mouth daily.   carbidopa-levodopa (SINEMET IR) 25-100 MG tablet    Cholecalciferol 25 MCG (1000 UT) tablet Take by mouth.   lovastatin (MEVACOR) 40 MG tablet Take 40 mg by mouth at bedtime.   metoprolol tartrate (LOPRESSOR) 25 MG tablet Take by mouth.   pantoprazole (PROTONIX) 40 MG tablet Take 40 mg by mouth daily.    vitamin B-12 (CYANOCOBALAMIN) 500 MCG tablet Take 500 mcg by mouth daily.    Past Medical History:  Diagnosis Date   AAA (abdominal aortic aneurysm) (HCC)    Adenocarcinoma (HCC)    Anemia    B12 deficiency    Cardiomyopathy, ischemic 10/18/2015   Coronary artery disease    Diabetes mellitus without complication (HCC)    Diverticulosis    Dyspnea    GERD (gastroesophageal reflux disease)    Hyperlipidemia    Hypertension    Low testosterone    Myocardial infarction ([place]) 10/12/2015   neuropathy     Neuropathy    Skin cancer    Basal Cell   Ventricular tachycardia, sustained ([place]) 10/18/2015    Past Surgical History:  Procedure Laterality Date   ABDOMINAL AORTIC ANEURYSM REPAIR  2014   Dr. Delana Meyer, [facility]   arthrosopic rotator cuff repair Left    CARDIAC CATHETERIZATION N/A 09/17/2015   Procedure: Left Heart Cath and Coronary Angiography;  Surgeon: Wellington Hampshire, MD;  Location: [place] CV LAB;  Service: Cardiovascular;  Laterality: N/A;   CARDIAC CATHETERIZATION N/A 09/17/2015   Procedure: Coronary Stent Intervention;  Surgeon: Wellington Hampshire, MD;  Location: [place] CV LAB;  Service: Cardiovascular;  Laterality: N/A;   COLON SURGERY  05/2006   COLONOSCOPY WITH PROPOFOL N/A 04/13/2015   Procedure: COLONOSCOPY WITH PROPOFOL;  Surgeon: Lollie Sails, MD;  Location: [facility] ENDOSCOPY;  Service: Endoscopy;  Laterality: N/A;   COLONOSCOPY WITH PROPOFOL N/A 04/14/2015   Procedure: COLONOSCOPY WITH PROPOFOL;  Surgeon: Lollie Sails, MD;  Location: [facility] ENDOSCOPY;  Service: Endoscopy;  Laterality: N/A;   COLONOSCOPY WITH PROPOFOL N/A 07/20/2015   Procedure: COLONOSCOPY WITH PROPOFOL;  Surgeon: Lollie Sails, MD;  Location: [facility] ENDOSCOPY;  Service: Endoscopy;  Laterality: N/A;   CORONARY ANGIOPLASTY     ESOPHAGOGASTRODUODENOSCOPY N/A 08/29/2016   Procedure: ESOPHAGOGASTRODUODENOSCOPY (EGD) with removal of foreign body;  Surgeon: Lollie Sails, MD;  Location: [facility] ENDOSCOPY;  Service: Endoscopy;  Laterality: N/A;   ESOPHAGOGASTRODUODENOSCOPY (EGD) WITH PROPOFOL  N/A 09/29/2016   Procedure: ESOPHAGOGASTRODUODENOSCOPY (EGD) WITH PROPOFOL;  Surgeon: Lollie Sails, MD;  Location: [facility] ENDOSCOPY;  Service: Endoscopy;  Laterality: N/A;   EYE SURGERY Bilateral    Cataract Extraction with IOL   LAPAROSCOPIC RIGHT COLECTOMY Right 09/14/2015   Procedure: LAPAROSCOPIC RIGHT COLECTOMY  CONVERTED TO OPEN RIGHT COLECTOMY, LYSIS OF ADHESIONS;  Surgeon: Leonie Green, MD;  Location: [facility] ORS;  Service: General;  Laterality: Right;   robotic colectomy     VASCULAR SURGERY      Social History Social History   Tobacco Use   Smoking status: Former Smoker    Packs/day: 1.50    Types: Cigarettes    Quit date: 10/03/2003    Years since quitting: 16.7   Smokeless tobacco: Never Used  Substance Use Topics   Alcohol use: Yes    Alcohol/week: 1.0 standard drink    Types: 1 Cans of beer per week   Drug use: No    Family History Family History  Problem Relation Age of Onset   CAD Mother    Diabetes type II Mother    Heart attack Mother    Lung cancer Father    Glaucoma Other     Allergies  Allergen Reactions   Lipitor [Atorvastatin] Other (See Comments)    Reaction:  Leg cramps    Pravachol [Pravastatin Sodium] Other (See Comments)    Reaction:  Leg cramps   Riomet [Metformin Hcl] Diarrhea   Vytorin [Ezetimibe-Simvastatin] Other (See Comments)    Reaction:  Leg cramps     REVIEW OF SYSTEMS (Negative unless checked)  Constitutional: [] Weight loss  [] Fever  [] Chills Cardiac: [] Chest pain   [] Chest pressure   [] Palpitations   [] Shortness of breath when laying flat   [] Shortness of breath with exertion. Vascular:  [] Pain in legs with walking   [] Pain in legs at rest  [] History of DVT   [] Phlebitis   [] Swelling in legs   [] Varicose veins   [] Non-healing ulcers Pulmonary:   [] Uses home oxygen   [] Productive cough   [] Hemoptysis   [] Wheeze  [] COPD   [] Asthma Neurologic:  [] Dizziness   [] Seizures   [] History of stroke   [] History of TIA  [] Aphasia   [] Vissual changes   [] Weakness or numbness in arm   [] Weakness or numbness in leg Musculoskeletal:   [] Joint swelling   [] Joint pain   [] Low back pain Hematologic:  [] Easy bruising  [] Easy bleeding   [] Hypercoagulable state   [] Anemic Gastrointestinal:  [] Diarrhea   [] Vomiting  [] Gastroesophageal reflux/heartburn   [] Difficulty swallowing. Genitourinary:  [] Chronic kidney disease    [] Difficult urination  [] Frequent urination   [] Blood in urine Skin:  [] Rashes   [] Ulcers  Psychological:  [] History of anxiety   []  History of major depression.  Physical Examination  Vitals:   06/17/20 0902  BP: 104/62  Pulse: 74  Resp: 16  Weight: 145 lb 9.6 oz (66 kg)   Body mass index is 23.5 kg/m. Gen: WD/WN, NAD Head: [place]/AT, No temporalis wasting.  Ear/Nose/Throat: Hearing grossly intact, nares w/o erythema or drainage Eyes: PER, EOMI, sclera nonicteric.  Neck: Supple, no large masses.   Pulmonary:  Good air movement, no audible wheezing bilaterally, no use of accessory muscles.  Cardiac: RRR, no JVD Vascular:  Vessel Right Left  Radial Palpable Palpable  Gastrointestinal: Non-distended. No guarding/no peritoneal signs.  Musculoskeletal: M/S 5/5 throughout.  No deformity or atrophy.  Neurologic: CN 2-12 intact. Symmetrical.  Speech is fluent. Motor exam as listed  above. Psychiatric: Judgment intact, Mood & affect appropriate for pt's clinical situation. Dermatologic: No rashes or ulcers noted.  No changes consistent with cellulitis.  CBC Lab Results  Component Value Date   WBC 7.9 08/29/2016   HGB 12.9 (L) 08/29/2016   HCT 39.6 (L) 08/29/2016   MCV 79.9 (L) 08/29/2016   PLT 227 08/29/2016    BMET    Component Value Date/Time   NA 141 08/29/2016 1855   NA 141 04/23/2014 0413   K 3.9 08/29/2016 1855   K 4.6 04/23/2014 0413   CL 106 08/29/2016 1855   CL 108 (H) 04/23/2014 0413   CO2 25 08/29/2016 1855   CO2 21 04/23/2014 0413   GLUCOSE 124 (H) 08/29/2016 1855   GLUCOSE 150 (H) 04/23/2014 0413   BUN 27 (H) 08/29/2016 1855   BUN 14 04/23/2014 0413   CREATININE 1.31 (H) 08/29/2016 1855   CREATININE 1.09 04/23/2014 0413   CALCIUM 9.9 08/29/2016 1855   CALCIUM 8.3 (L) 04/23/2014 0413   GFRNONAA 50 (L) 08/29/2016 1855   GFRNONAA >60 04/23/2014 0413   GFRAA 58 (L) 08/29/2016 1855   GFRAA >60 04/23/2014 0413   CrCl cannot be calculated (Patient's most  recent lab result is older than the maximum 21 days allowed.).  COAG Lab Results  Component Value Date   INR 1.11 08/29/2016   INR 1.28 09/29/2015   INR 1.1 04/23/2014    Radiology VAS [place] EVAR DUPLEX  Result Date: 06/17/2020 Endovascular Aortic Repair Study (EVAR) Indications: Follow up exam for EVAR. Vascular Interventions: EVAR on 04/22/14.  Comparison Study: 06/16/2019 Performing Technologist: Almira Coaster RVS  Examination Guidelines: A complete evaluation includes B-mode imaging, spectral Doppler, color Doppler, and power Doppler as needed of all accessible portions of each vessel. Bilateral testing is considered an integral part of a complete examination. Limited examinations for reoccurring indications may be performed as noted.  Endovascular Aortic Repair (EVAR): +----------+----------------+-------------------+-------------------+             Diameter AP (cm) Diameter Trans (cm) Velocities (cm/sec)  +----------+----------------+-------------------+-------------------+  Aorta      3.46             3.68                55                   +----------+----------------+-------------------+-------------------+  Right Limb 1.32             1.28                31                   +----------+----------------+-------------------+-------------------+  Left Limb  1.26             1.33                37                   +----------+----------------+-------------------+-------------------+  Summary: Abdominal Aorta: Patent endovascular aneurysm repair with no evidence of endoleak. The largest aortic diameter has increased compared to prior exam. Previous diameter measurement was 3.3 cm obtained on 06/16/2019.  *See table(s) above for measurements and observations.  Electronically signed by Hortencia Pilar MD on 06/17/2020 at 5:27:26 PM.   Final      Assessment/Plan 1. Abdominal aortic aneurysm (AAA) without rupture [facility]) Recommend:  Patient is status post successful endovascular repair of the  AAA on8/26/2015.   No further  intervention is required at this time.  No endoleak is detected and the aneurysm sac is stable.  The patient will continue antiplatelet therapy as prescribed as well as aggressive management of hyperlipidemia. Exercise is again strongly encouraged.   However, endografts require continued surveillance with ultrasound or CT scan. This is mandatory to detect any changes that allow repressurization of the aneurysm sac. The patient is informed that this would be asymptomatic.  The patient is reminded that lifelong routine surveillance is a necessity with an endograft. Patient will continue to follow-up at 12 month intervals with ultrasound of the aorta.  - VAS US AORTA/IVC/ILIACS; Future  2. Coronary artery disease of native artery of native heart with stable angina pectoris (HCC) Continue cardiac and antihypertensive medications as already ordered and reviewed, no changes at this time.  Continue statin as ordered and reviewed, no changes at this time  Nitrates PRN for chest pain   3. Benign essential hypertension Continue antihypertensive medications as already ordered, these medications have been reviewed and there are no changes at this time.   4. Type 2 diabetes mellitus with diabetic peripheral angiopathy without gangrene, without long-term current use of insulin ([place]) Continue hypoglycemic medications as already ordered, these medications have been reviewed and there are no changes at this time.  Hgb A1C to be monitored as already arranged by primary service   5. Mixed hyperlipidemia Continue statin as ordered and reviewed, no changes at this time     Hortencia Pilar, MD  06/20/2020 9:43 AM

## 2020-06-22 DIAGNOSIS — C44519 Basal cell carcinoma of skin of other part of trunk: Secondary | ICD-10-CM | POA: Diagnosis not present

## 2020-06-22 DIAGNOSIS — C4441 Basal cell carcinoma of skin of scalp and neck: Secondary | ICD-10-CM | POA: Diagnosis not present

## 2020-07-06 DIAGNOSIS — G20 Parkinson's disease: Secondary | ICD-10-CM | POA: Diagnosis not present

## 2020-07-06 DIAGNOSIS — G4752 REM sleep behavior disorder: Secondary | ICD-10-CM | POA: Diagnosis not present

## 2020-08-10 DIAGNOSIS — I714 Abdominal aortic aneurysm, without rupture: Secondary | ICD-10-CM | POA: Diagnosis not present

## 2020-08-10 DIAGNOSIS — E782 Mixed hyperlipidemia: Secondary | ICD-10-CM | POA: Diagnosis not present

## 2020-08-10 DIAGNOSIS — I5022 Chronic systolic (congestive) heart failure: Secondary | ICD-10-CM | POA: Diagnosis not present

## 2020-08-10 DIAGNOSIS — I2510 Atherosclerotic heart disease of native coronary artery without angina pectoris: Secondary | ICD-10-CM | POA: Diagnosis not present

## 2020-08-10 DIAGNOSIS — I10 Essential (primary) hypertension: Secondary | ICD-10-CM | POA: Diagnosis not present

## 2020-08-31 DIAGNOSIS — D518 Other vitamin B12 deficiency anemias: Secondary | ICD-10-CM | POA: Diagnosis not present

## 2020-08-31 DIAGNOSIS — N183 Chronic kidney disease, stage 3 unspecified: Secondary | ICD-10-CM | POA: Diagnosis not present

## 2020-08-31 DIAGNOSIS — E782 Mixed hyperlipidemia: Secondary | ICD-10-CM | POA: Diagnosis not present

## 2020-08-31 DIAGNOSIS — E1122 Type 2 diabetes mellitus with diabetic chronic kidney disease: Secondary | ICD-10-CM | POA: Diagnosis not present

## 2020-08-31 DIAGNOSIS — N1831 Chronic kidney disease, stage 3a: Secondary | ICD-10-CM | POA: Diagnosis not present

## 2020-09-07 DIAGNOSIS — E1142 Type 2 diabetes mellitus with diabetic polyneuropathy: Secondary | ICD-10-CM | POA: Diagnosis not present

## 2020-09-07 DIAGNOSIS — E1122 Type 2 diabetes mellitus with diabetic chronic kidney disease: Secondary | ICD-10-CM | POA: Diagnosis not present

## 2020-09-07 DIAGNOSIS — I5022 Chronic systolic (congestive) heart failure: Secondary | ICD-10-CM | POA: Diagnosis not present

## 2020-09-07 DIAGNOSIS — I130 Hypertensive heart and chronic kidney disease with heart failure and stage 1 through stage 4 chronic kidney disease, or unspecified chronic kidney disease: Secondary | ICD-10-CM | POA: Diagnosis not present

## 2020-09-07 DIAGNOSIS — E1169 Type 2 diabetes mellitus with other specified complication: Secondary | ICD-10-CM | POA: Diagnosis not present

## 2020-09-07 DIAGNOSIS — E782 Mixed hyperlipidemia: Secondary | ICD-10-CM | POA: Diagnosis not present

## 2020-09-07 DIAGNOSIS — B351 Tinea unguium: Secondary | ICD-10-CM | POA: Diagnosis not present

## 2020-09-07 DIAGNOSIS — I714 Abdominal aortic aneurysm, without rupture: Secondary | ICD-10-CM | POA: Diagnosis not present

## 2020-09-07 DIAGNOSIS — N183 Chronic kidney disease, stage 3 unspecified: Secondary | ICD-10-CM | POA: Diagnosis not present

## 2020-11-01 DIAGNOSIS — D2262 Melanocytic nevi of left upper limb, including shoulder: Secondary | ICD-10-CM | POA: Diagnosis not present

## 2020-11-01 DIAGNOSIS — L570 Actinic keratosis: Secondary | ICD-10-CM | POA: Diagnosis not present

## 2020-11-01 DIAGNOSIS — Z85828 Personal history of other malignant neoplasm of skin: Secondary | ICD-10-CM | POA: Diagnosis not present

## 2020-11-01 DIAGNOSIS — X32XXXA Exposure to sunlight, initial encounter: Secondary | ICD-10-CM | POA: Diagnosis not present

## 2020-11-01 DIAGNOSIS — D2271 Melanocytic nevi of right lower limb, including hip: Secondary | ICD-10-CM | POA: Diagnosis not present

## 2020-11-01 DIAGNOSIS — D2261 Melanocytic nevi of right upper limb, including shoulder: Secondary | ICD-10-CM | POA: Diagnosis not present

## 2020-11-01 DIAGNOSIS — D0462 Carcinoma in situ of skin of left upper limb, including shoulder: Secondary | ICD-10-CM | POA: Diagnosis not present

## 2020-11-01 DIAGNOSIS — D485 Neoplasm of uncertain behavior of skin: Secondary | ICD-10-CM | POA: Diagnosis not present

## 2020-11-18 DIAGNOSIS — G20 Parkinson's disease: Secondary | ICD-10-CM | POA: Diagnosis not present

## 2020-11-30 DIAGNOSIS — E118 Type 2 diabetes mellitus with unspecified complications: Secondary | ICD-10-CM | POA: Diagnosis not present

## 2020-11-30 DIAGNOSIS — E782 Mixed hyperlipidemia: Secondary | ICD-10-CM | POA: Diagnosis not present

## 2020-11-30 DIAGNOSIS — D518 Other vitamin B12 deficiency anemias: Secondary | ICD-10-CM | POA: Diagnosis not present

## 2020-12-01 DIAGNOSIS — D0462 Carcinoma in situ of skin of left upper limb, including shoulder: Secondary | ICD-10-CM | POA: Diagnosis not present

## 2020-12-07 DIAGNOSIS — Z0000 Encounter for general adult medical examination without abnormal findings: Secondary | ICD-10-CM | POA: Diagnosis not present

## 2020-12-07 DIAGNOSIS — G20 Parkinson's disease: Secondary | ICD-10-CM | POA: Diagnosis not present

## 2020-12-07 DIAGNOSIS — I714 Abdominal aortic aneurysm, without rupture: Secondary | ICD-10-CM | POA: Diagnosis not present

## 2020-12-07 DIAGNOSIS — I5022 Chronic systolic (congestive) heart failure: Secondary | ICD-10-CM | POA: Diagnosis not present

## 2020-12-07 DIAGNOSIS — B351 Tinea unguium: Secondary | ICD-10-CM | POA: Diagnosis not present

## 2020-12-07 DIAGNOSIS — E1151 Type 2 diabetes mellitus with diabetic peripheral angiopathy without gangrene: Secondary | ICD-10-CM | POA: Diagnosis not present

## 2020-12-07 DIAGNOSIS — D692 Other nonthrombocytopenic purpura: Secondary | ICD-10-CM | POA: Diagnosis not present

## 2020-12-07 DIAGNOSIS — E1169 Type 2 diabetes mellitus with other specified complication: Secondary | ICD-10-CM | POA: Diagnosis not present

## 2020-12-07 DIAGNOSIS — I110 Hypertensive heart disease with heart failure: Secondary | ICD-10-CM | POA: Diagnosis not present

## 2021-03-16 DIAGNOSIS — E119 Type 2 diabetes mellitus without complications: Secondary | ICD-10-CM | POA: Diagnosis not present

## 2021-04-05 DIAGNOSIS — G20 Parkinson's disease: Secondary | ICD-10-CM | POA: Diagnosis not present

## 2021-04-05 DIAGNOSIS — G4752 REM sleep behavior disorder: Secondary | ICD-10-CM | POA: Diagnosis not present

## 2021-04-12 DIAGNOSIS — I255 Ischemic cardiomyopathy: Secondary | ICD-10-CM | POA: Diagnosis not present

## 2021-04-12 DIAGNOSIS — I10 Essential (primary) hypertension: Secondary | ICD-10-CM | POA: Diagnosis not present

## 2021-04-12 DIAGNOSIS — E782 Mixed hyperlipidemia: Secondary | ICD-10-CM | POA: Diagnosis not present

## 2021-04-12 DIAGNOSIS — I2510 Atherosclerotic heart disease of native coronary artery without angina pectoris: Secondary | ICD-10-CM | POA: Diagnosis not present

## 2021-04-12 DIAGNOSIS — Z955 Presence of coronary angioplasty implant and graft: Secondary | ICD-10-CM | POA: Diagnosis not present

## 2021-04-12 DIAGNOSIS — E118 Type 2 diabetes mellitus with unspecified complications: Secondary | ICD-10-CM | POA: Diagnosis not present

## 2021-04-12 DIAGNOSIS — I5022 Chronic systolic (congestive) heart failure: Secondary | ICD-10-CM | POA: Diagnosis not present

## 2021-04-12 DIAGNOSIS — I714 Abdominal aortic aneurysm, without rupture: Secondary | ICD-10-CM | POA: Diagnosis not present

## 2021-05-11 ENCOUNTER — Emergency Department: Payer: Medicare HMO | Admitting: Anesthesiology

## 2021-05-11 ENCOUNTER — Other Ambulatory Visit: Payer: Self-pay

## 2021-05-11 ENCOUNTER — Encounter: Payer: Self-pay | Admitting: Emergency Medicine

## 2021-05-11 ENCOUNTER — Ambulatory Visit
Admission: EM | Admit: 2021-05-11 | Discharge: 2021-05-11 | Disposition: A | Discharge status: Home / Self Care | Payer: Medicare HMO | Attending: Internal Medicine | Admitting: Internal Medicine

## 2021-05-11 ENCOUNTER — Emergency Department: Payer: Medicare HMO

## 2021-05-11 ENCOUNTER — Encounter
Admission: EM | Disposition: A | Discharge status: Home / Self Care | Payer: Self-pay | Source: Home / Self Care | Attending: Emergency Medicine

## 2021-05-11 DIAGNOSIS — K219 Gastro-esophageal reflux disease without esophagitis: Secondary | ICD-10-CM | POA: Insufficient documentation

## 2021-05-11 DIAGNOSIS — Z820 Family history of epilepsy and other diseases of the nervous system: Secondary | ICD-10-CM | POA: Insufficient documentation

## 2021-05-11 DIAGNOSIS — E782 Mixed hyperlipidemia: Secondary | ICD-10-CM | POA: Diagnosis not present

## 2021-05-11 DIAGNOSIS — K449 Diaphragmatic hernia without obstruction or gangrene: Secondary | ICD-10-CM | POA: Diagnosis not present

## 2021-05-11 DIAGNOSIS — Z8249 Family history of ischemic heart disease and other diseases of the circulatory system: Secondary | ICD-10-CM | POA: Insufficient documentation

## 2021-05-11 DIAGNOSIS — R079 Chest pain, unspecified: Secondary | ICD-10-CM

## 2021-05-11 DIAGNOSIS — K3189 Other diseases of stomach and duodenum: Secondary | ICD-10-CM | POA: Insufficient documentation

## 2021-05-11 DIAGNOSIS — Z801 Family history of malignant neoplasm of trachea, bronchus and lung: Secondary | ICD-10-CM | POA: Diagnosis not present

## 2021-05-11 DIAGNOSIS — Z833 Family history of diabetes mellitus: Secondary | ICD-10-CM | POA: Insufficient documentation

## 2021-05-11 DIAGNOSIS — Z87891 Personal history of nicotine dependence: Secondary | ICD-10-CM | POA: Diagnosis not present

## 2021-05-11 DIAGNOSIS — G20 Parkinson's disease: Secondary | ICD-10-CM | POA: Diagnosis not present

## 2021-05-11 DIAGNOSIS — I130 Hypertensive heart and chronic kidney disease with heart failure and stage 1 through stage 4 chronic kidney disease, or unspecified chronic kidney disease: Secondary | ICD-10-CM | POA: Insufficient documentation

## 2021-05-11 DIAGNOSIS — E1142 Type 2 diabetes mellitus with diabetic polyneuropathy: Secondary | ICD-10-CM | POA: Insufficient documentation

## 2021-05-11 DIAGNOSIS — I4510 Unspecified right bundle-branch block: Secondary | ICD-10-CM | POA: Diagnosis not present

## 2021-05-11 DIAGNOSIS — I5022 Chronic systolic (congestive) heart failure: Secondary | ICD-10-CM | POA: Diagnosis not present

## 2021-05-11 DIAGNOSIS — Z955 Presence of coronary angioplasty implant and graft: Secondary | ICD-10-CM | POA: Diagnosis not present

## 2021-05-11 DIAGNOSIS — K2970 Gastritis, unspecified, without bleeding: Secondary | ICD-10-CM | POA: Diagnosis not present

## 2021-05-11 DIAGNOSIS — K222 Esophageal obstruction: Secondary | ICD-10-CM

## 2021-05-11 DIAGNOSIS — Z79899 Other long term (current) drug therapy: Secondary | ICD-10-CM | POA: Insufficient documentation

## 2021-05-11 DIAGNOSIS — R1314 Dysphagia, pharyngoesophageal phase: Secondary | ICD-10-CM | POA: Diagnosis not present

## 2021-05-11 DIAGNOSIS — N1831 Chronic kidney disease, stage 3a: Secondary | ICD-10-CM | POA: Diagnosis not present

## 2021-05-11 DIAGNOSIS — Z888 Allergy status to other drugs, medicaments and biological substances status: Secondary | ICD-10-CM | POA: Insufficient documentation

## 2021-05-11 DIAGNOSIS — E785 Hyperlipidemia, unspecified: Secondary | ICD-10-CM | POA: Diagnosis not present

## 2021-05-11 DIAGNOSIS — I255 Ischemic cardiomyopathy: Secondary | ICD-10-CM | POA: Insufficient documentation

## 2021-05-11 DIAGNOSIS — Z20822 Contact with and (suspected) exposure to covid-19: Secondary | ICD-10-CM | POA: Diagnosis not present

## 2021-05-11 DIAGNOSIS — E1122 Type 2 diabetes mellitus with diabetic chronic kidney disease: Secondary | ICD-10-CM | POA: Diagnosis not present

## 2021-05-11 DIAGNOSIS — R1310 Dysphagia, unspecified: Secondary | ICD-10-CM | POA: Diagnosis not present

## 2021-05-11 DIAGNOSIS — R0789 Other chest pain: Secondary | ICD-10-CM | POA: Diagnosis not present

## 2021-05-11 HISTORY — PX: ESOPHAGOGASTRODUODENOSCOPY: SHX5428

## 2021-05-11 LAB — CBC WITH DIFFERENTIAL/PLATELET
Abs Immature Granulocytes: 0.03 10*3/uL (ref 0.00–0.07)
Basophils Absolute: 0.1 10*3/uL (ref 0.0–0.1)
Basophils Relative: 1 %
Eosinophils Absolute: 0.1 10*3/uL (ref 0.0–0.5)
Eosinophils Relative: 1 %
HCT: 41.9 % (ref 39.0–52.0)
Hemoglobin: 14 g/dL (ref 13.0–17.0)
Immature Granulocytes: 0 %
Lymphocytes Relative: 7 %
Lymphs Abs: 0.5 10*3/uL — ABNORMAL LOW (ref 0.7–4.0)
MCH: 29.5 pg (ref 26.0–34.0)
MCHC: 33.4 g/dL (ref 30.0–36.0)
MCV: 88.2 fL (ref 80.0–100.0)
Monocytes Absolute: 0.4 10*3/uL (ref 0.1–1.0)
Monocytes Relative: 6 %
Neutro Abs: 5.8 10*3/uL (ref 1.7–7.7)
Neutrophils Relative %: 85 %
Platelets: 288 10*3/uL (ref 150–400)
RBC: 4.75 MIL/uL (ref 4.22–5.81)
RDW: 13.4 % (ref 11.5–15.5)
WBC: 6.9 10*3/uL (ref 4.0–10.5)
nRBC: 0 % (ref 0.0–0.2)

## 2021-05-11 LAB — RESP PANEL BY RT-PCR (FLU A&B, COVID) ARPGX2
Influenza A by PCR: NEGATIVE
Influenza B by PCR: NEGATIVE
SARS Coronavirus 2 by RT PCR: NEGATIVE

## 2021-05-11 LAB — TROPONIN I (HIGH SENSITIVITY): Troponin I (High Sensitivity): 13 ng/L (ref <18)

## 2021-05-11 LAB — CBG MONITORING, ED
Glucose-Capillary: 49 mg/dL — ABNORMAL LOW (ref 70–99)
Glucose-Capillary: 52 mg/dL — ABNORMAL LOW (ref 70–99)
Glucose-Capillary: 80 mg/dL (ref 70–99)

## 2021-05-11 LAB — COMPREHENSIVE METABOLIC PANEL
ALT: 5 U/L (ref 0–44)
AST: 13 U/L — ABNORMAL LOW (ref 15–41)
Albumin: 4.3 g/dL (ref 3.5–5.0)
Alkaline Phosphatase: 59 U/L (ref 38–126)
Anion gap: 15 (ref 5–15)
BUN: 16 mg/dL (ref 8–23)
CO2: 27 mmol/L (ref 22–32)
Calcium: 9.1 mg/dL (ref 8.9–10.3)
Chloride: 97 mmol/L — ABNORMAL LOW (ref 98–111)
Creatinine, Ser: 0.89 mg/dL (ref 0.61–1.24)
GFR, Estimated: >60 mL/min (ref >60)
Glucose, Bld: 68 mg/dL — ABNORMAL LOW (ref 70–99)
Potassium: 3.8 mmol/L (ref 3.5–5.1)
Sodium: 139 mmol/L (ref 135–145)
Total Bilirubin: 1.8 mg/dL — ABNORMAL HIGH (ref 0.3–1.2)
Total Protein: 7.3 g/dL (ref 6.5–8.1)

## 2021-05-11 LAB — LIPASE, BLOOD: Lipase: 22 U/L (ref 11–51)

## 2021-05-11 SURGERY — EGD (ESOPHAGOGASTRODUODENOSCOPY)
Anesthesia: General

## 2021-05-11 MED ORDER — FENTANYL CITRATE PF 50 MCG/ML IJ SOSY: 25.0000 ug | PREFILLED_SYRINGE | INTRAMUSCULAR | Status: DC | PRN

## 2021-05-11 MED ORDER — SUCCINYLCHOLINE CHLORIDE 200 MG/10ML IV SOSY
PREFILLED_SYRINGE | INTRAVENOUS | Status: DC | PRN
  Administered 2021-05-11: 100 mg via INTRAVENOUS

## 2021-05-11 MED ORDER — DEXTROSE 50 % IV SOLN
INTRAVENOUS | Status: AC
  Filled 2021-05-11: qty 50

## 2021-05-11 MED ORDER — ONDANSETRON HCL 4 MG/2ML IJ SOLN
INTRAMUSCULAR | Status: DC | PRN
  Administered 2021-05-11: 4 mg via INTRAVENOUS

## 2021-05-11 MED ORDER — ONDANSETRON HCL 4 MG/2ML IJ SOLN
INTRAMUSCULAR | Status: AC
  Filled 2021-05-11: qty 2

## 2021-05-11 MED ORDER — SUCCINYLCHOLINE CHLORIDE 200 MG/10ML IV SOSY
PREFILLED_SYRINGE | INTRAVENOUS | Status: AC
  Filled 2021-05-11: qty 10

## 2021-05-11 MED ORDER — ONDANSETRON HCL 4 MG/2ML IJ SOLN: 4.0000 mg | Freq: Once | INTRAMUSCULAR | Status: DC | PRN

## 2021-05-11 MED ORDER — PROPOFOL 10 MG/ML IV BOLUS
INTRAVENOUS | Status: DC | PRN
  Administered 2021-05-11: 120 mg via INTRAVENOUS

## 2021-05-11 MED ORDER — SODIUM CHLORIDE 0.9 % IV SOLN: INTRAVENOUS | Status: DC

## 2021-05-11 MED ORDER — DEXMEDETOMIDINE HCL IN NACL 200 MCG/50ML IV SOLN
INTRAVENOUS | Status: DC | PRN
  Administered 2021-05-11: 12 ug via INTRAVENOUS

## 2021-05-11 MED ORDER — SODIUM CHLORIDE 0.9 % IV BOLUS
1000.0000 mL | Freq: Once | INTRAVENOUS | Status: AC
  Administered 2021-05-11: 1000 mL via INTRAVENOUS

## 2021-05-11 MED ORDER — DEXTROSE 50 % IV SOLN
12.5000 g | Freq: Once | INTRAVENOUS | Status: AC
  Administered 2021-05-11: 12.5 g via INTRAVENOUS

## 2021-05-11 NOTE — ED Provider Notes (Signed)
[facility] Emergency Department Provider Note  ____________________________________________  Time seen: Approximately 2:04 PM  I have reviewed the triage vital signs and the nursing notes.   HISTORY  Chief Complaint Dysphagia    HPI Nicholas Parker is a 87 y.o. male with a history of aortic aneurysm, GERD, hypertension who comes ED complaining of inability to eat or drink for the past 5 days.  Reports that this started after eating some steak 5 days ago, after which she felt like he could no longer swallow anything.  He has had vomiting since then.  Unable to take his medications.  Has some chest discomfort with this, no shortness of breath, no exertional or pleuritic symptoms.  No cough, no fever, no palpitations dizziness or syncope.  He has a history of esophageal stricture requiring EGD for food impaction removal and esophageal dilation.    Past Medical History:  Diagnosis Date   AAA (abdominal aortic aneurysm) ([place])    Adenocarcinoma ([place])    Anemia    B12 deficiency    Cardiomyopathy, ischemic 10/18/2015   Coronary artery disease    Diabetes mellitus without complication (HCC)    Diverticulosis    Dyspnea    GERD (gastroesophageal reflux disease)    Hyperlipidemia    Hypertension    Low testosterone    Myocardial infarction ([place]) 10/12/2015   neuropathy    Neuropathy    Skin cancer    Basal Cell   Ventricular tachycardia, sustained ([place]) 10/18/2015     Patient Active Problem List   Diagnosis Date Noted   Parkinson's disease ([place]) 10/29/2019   Diabetic peripheral neuropathy associated with type 2 diabetes mellitus ([place]) 06/16/2019   Low testosterone 06/16/2019   AAA (abdominal aortic aneurysm) ([place]) 06/16/2019   Anemia 06/16/2019   B12 deficiency anemia 06/16/2019   Gastroesophageal reflux disease 06/16/2019   CAD (coronary artery disease) 06/16/2019   Onychomycosis of multiple toenails with type 2 diabetes mellitus ([place]) 09/25/2018    Chronic kidney disease (CKD) stage G3a/A1, moderately decreased glomerular filtration rate (GFR) between 45-59 mL/min/1.73 square meter and albuminuria creatinine ratio less than 30 mg/g ([place]) 05/26/2018   Polyneuropathy due to medical condition ([place]) 05/26/2018   High risk medication use 08/23/2017   CHF (congestive heart failure), NYHA class II, chronic, systolic ([place]) 78/93/8101   Senile purpura ([place]) 08/15/2016   Ischemic cardiomyopathy 06/13/2016   Abdominal aortic aneurysm (AAA) ([place]) 10/18/2015   Absolute anemia 10/18/2015   Vitamin B12 deficiency anemia 10/18/2015   Arteriosclerosis of coronary artery 10/18/2015   Acid reflux 10/18/2015   Hypotestosteronism 10/18/2015   Neuropathy 10/18/2015   Ventricular tachycardia, sustained ([place]) 10/18/2015   Cardiomyopathy, ischemic 10/18/2015   Myocardial infarction ([place]) 10/12/2015   Abdominal abscess 10/11/2015   Presence of stent in coronary artery 10/10/2015   Tubular adenoma of colon 10/10/2015   History of convulsions 10/10/2015   History of heart artery stent 10/10/2015   Seizure ([place])    Hypoglycemia 10/07/2015   Renal failure 09/24/2015   Acute ST elevation myocardial infarction (STEMI) involving right coronary artery ([place])    Colon polyp 09/14/2015   Abnormal bruising 08/12/2015   Type 2 diabetes mellitus ([place]) 08/11/2015   Benign essential HTN 02/04/2015   Hyperlipidemia 02/04/2015   Benign essential hypertension 02/04/2015   Hyperlipidemia, mixed 02/04/2015     Past Surgical History:  Procedure Laterality Date   ABDOMINAL AORTIC ANEURYSM REPAIR  2014   Dr. Delana Meyer, [facility]   arthrosopic rotator cuff repair Left  CARDIAC CATHETERIZATION N/A 09/17/2015   Procedure: Left Heart Cath and Coronary Angiography;  Surgeon: Wellington Hampshire, MD;  Location: [place] CV LAB;  Service: Cardiovascular;  Laterality: N/A;   CARDIAC CATHETERIZATION N/A 09/17/2015   Procedure: Coronary Stent Intervention;  Surgeon: Wellington Hampshire, MD;  Location: [place] CV LAB;  Service: Cardiovascular;  Laterality: N/A;   COLON SURGERY  05/2006   COLONOSCOPY WITH PROPOFOL N/A 04/13/2015   Procedure: COLONOSCOPY WITH PROPOFOL;  Surgeon: Lollie Sails, MD;  Location: [facility] ENDOSCOPY;  Service: Endoscopy;  Laterality: N/A;   COLONOSCOPY WITH PROPOFOL N/A 04/14/2015   Procedure: COLONOSCOPY WITH PROPOFOL;  Surgeon: Lollie Sails, MD;  Location: [facility] ENDOSCOPY;  Service: Endoscopy;  Laterality: N/A;   COLONOSCOPY WITH PROPOFOL N/A 07/20/2015   Procedure: COLONOSCOPY WITH PROPOFOL;  Surgeon: Lollie Sails, MD;  Location: [facility] ENDOSCOPY;  Service: Endoscopy;  Laterality: N/A;   CORONARY ANGIOPLASTY     ESOPHAGOGASTRODUODENOSCOPY N/A 08/29/2016   Procedure: ESOPHAGOGASTRODUODENOSCOPY (EGD) with removal of foreign body;  Surgeon: Lollie Sails, MD;  Location: [facility] ENDOSCOPY;  Service: Endoscopy;  Laterality: N/A;   ESOPHAGOGASTRODUODENOSCOPY (EGD) WITH PROPOFOL N/A 09/29/2016   Procedure: ESOPHAGOGASTRODUODENOSCOPY (EGD) WITH PROPOFOL;  Surgeon: Lollie Sails, MD;  Location: [facility] ENDOSCOPY;  Service: Endoscopy;  Laterality: N/A;   EYE SURGERY Bilateral    Cataract Extraction with IOL   LAPAROSCOPIC RIGHT COLECTOMY Right 09/14/2015   Procedure: LAPAROSCOPIC RIGHT COLECTOMY  CONVERTED TO OPEN RIGHT COLECTOMY, LYSIS OF ADHESIONS;  Surgeon: Leonie Green, MD;  Location: [facility] ORS;  Service: General;  Laterality: Right;   robotic colectomy     VASCULAR SURGERY       Prior to Admission medications   Medication Sig Start Date End Date Taking? Authorizing Provider  aspirin EC 81 MG tablet Take 81 mg by mouth daily.    [provider]  carbidopa-levodopa (SINEMET IR) 25-100 MG tablet  05/14/20   [provider]  Cholecalciferol 25 MCG (1000 UT) tablet Take by mouth.    [provider]  lovastatin (MEVACOR) 40 MG tablet Take 40 mg by mouth at bedtime.    [provider]  metoprolol  tartrate (LOPRESSOR) 25 MG tablet Take by mouth.    [provider]  pantoprazole (PROTONIX) 40 MG tablet Take 40 mg by mouth daily.     [provider]  vitamin B-12 (CYANOCOBALAMIN) 500 MCG tablet Take 500 mcg by mouth daily.    [provider]     Allergies Lipitor [atorvastatin], Pravachol [pravastatin sodium], Riomet [metformin hcl], and Vytorin [ezetimibe-simvastatin]   Family History  Problem Relation Age of Onset   CAD Mother    Diabetes type II Mother    Heart attack Mother    Lung cancer Father    Glaucoma Other     Social History Social History   Tobacco Use   Smoking status: Former    Packs/day: 1.50    Types: Cigarettes    Quit date: 10/03/2003    Years since quitting: 17.6   Smokeless tobacco: Never  Substance Use Topics   Alcohol use: Yes    Alcohol/week: 1.0 standard drink    Types: 1 Cans of beer per week   Drug use: No    Review of Systems  Constitutional:   No fever or chills.  ENT:   No sore throat. No rhinorrhea. Cardiovascular:   Positive nonspecific chest pain as above without syncope. Respiratory:   No dyspnea or cough. Gastrointestinal:  Negative for abdominal pain, vomiting and diarrhea.  Musculoskeletal:   Negative for focal pain or swelling All other systems reviewed and are negative except as documented above in ROS and HPI.  ____________________________________________   PHYSICAL EXAM:  VITAL SIGNS: ED Triage Vitals  Enc Vitals Group     BP 05/11/21 1003 127/62     Pulse Rate 05/11/21 1003 81     Resp 05/11/21 1003 18     Temp 05/11/21 1003 98.1 F (36.7 C)     Temp src --      SpO2 05/11/21 1003 98 %     Weight 05/11/21 0955 145 lb 8.1 oz (66 kg)     Height 05/11/21 0955 5\' 6"  (1.676 m)     Head Circumference --      Peak Flow --      Pain Score 05/11/21 0955 0     Pain Loc --      Pain Edu? --      Excl. in [place]? --     Vital signs reviewed, nursing assessments reviewed.   Constitutional:    Alert and oriented. Non-toxic appearance. Eyes:   Conjunctivae are normal. EOMI. PERRL. ENT      Head:   Normocephalic and atraumatic.      Nose:   Wearing a mask.      Mouth/Throat:   Wearing a mask.      Neck:   No meningismus. Full ROM. Hematological/Lymphatic/Immunilogical:   No cervical lymphadenopathy. Cardiovascular:   RRR. Symmetric bilateral radial and DP pulses.  No murmurs. Cap refill less than 2 seconds. Respiratory:   Normal respiratory effort without tachypnea/retractions. Breath sounds are clear and equal bilaterally. No wheezes/rales/rhonchi. Gastrointestinal:   Soft and nontender. Non distended. There is no CVA tenderness.  No rebound, rigidity, or guarding. Genitourinary:   deferred Musculoskeletal:   Normal range of motion in all extremities. No joint effusions.  No lower extremity tenderness.  No edema. Neurologic:   Normal speech and language.  Motor grossly intact. No acute focal neurologic deficits are appreciated.  Skin:    Skin is warm, dry and intact. No rash noted.  No petechiae, purpura, or bullae.  ____________________________________________    LABS (pertinent positives/negatives) (all labs ordered are listed, but only abnormal results are displayed) Labs Reviewed  COMPREHENSIVE METABOLIC PANEL - Abnormal; Notable for the following components:      Result Value   Chloride 97 (*)    Glucose, Bld 68 (*)    AST 13 (*)    Total Bilirubin 1.8 (*)    All other components within normal limits  CBC WITH DIFFERENTIAL/PLATELET - Abnormal; Notable for the following components:   Lymphs Abs 0.5 (*)    All other components within normal limits  RESP PANEL BY RT-PCR (FLU A&B, COVID) ARPGX2  LIPASE, BLOOD  TROPONIN I (HIGH SENSITIVITY)  TROPONIN I (HIGH SENSITIVITY)   ____________________________________________   EKG  Interpreted by me Sinus rhythm rate of 70, right axis, normal intervals.  Right bundle branch block.  No acute ischemic  changes.  ____________________________________________    RADIOLOGY  DG Chest 2 View  Result Date: 05/11/2021 CLINICAL DATA:  chest pain, esophageal obstruction EXAM: CHEST - 2 VIEW COMPARISON:  CT 09/20/2016 FINDINGS: Unchanged cardiomediastinal silhouette there is no focal airspace disease. There is blunting of the right costophrenic sulcus, which could reflect pleural thickening or small pleural effusion. There is no focal airspace consolidation. There is no visible pneumothorax. Bilateral shoulder degenerative changes. No acute osseous  abnormality. A loop of transverse colon overlies the anterior upper abdomen. Thoracic spondylosis. IMPRESSION: Small right pleural effusion or pleural thickening. No focal airspace consolidation. Electronically Signed   By: Maurine Simmering M.D.   On: 05/11/2021 12:33    ____________________________________________   PROCEDURES Procedures  ____________________________________________  DIFFERENTIAL DIAGNOSIS   Dehydration, electrolyte abnormality, esophageal obstruction, non-STEMI  CLINICAL IMPRESSION / ASSESSMENT AND PLAN / ED COURSE  Medications ordered in the ED: Medications  sodium chloride 0.9 % bolus 1,000 mL (1,000 mLs Intravenous New Bag/Given 05/11/21 1203)    Pertinent labs & imaging results that were available during my care of the patient were reviewed by me and considered in my medical decision making (see chart for details).  Nicholas Parker was evaluated in Emergency Department on 05/11/2021 for the symptoms described in the history of present illness. He was evaluated in the context of the global COVID-19 pandemic, which necessitated consideration that the patient might be at risk for infection with the SARS-CoV-2 virus that causes COVID-19. Institutional protocols and algorithms that pertain to the evaluation of patients at risk for COVID-19 are in a state of rapid change based on information released by regulatory bodies including the CDC  and federal and state organizations. These policies and algorithms were followed during the patient's care in the ED.   Patient presents with symptoms of inability to swallow, most likely esophageal food impaction with his history.  Exam is benign and reassuring, vitals unremarkable.  EKG unremarkable.  Chest x-ray viewed and interpreted by me, appears normal other than calcific aortic atherosclerosis, and lab panel all unremarkable.  Discussed with gastroenterology who will plan for diagnostic and therapeutic EGD today.  He is medically stable to proceed.      ____________________________________________   FINAL CLINICAL IMPRESSION(S) / ED DIAGNOSES    Final diagnoses:  Esophageal obstruction  Nonspecific chest pain     ED Discharge Orders     None       Portions of this note were generated with dragon dictation software. Dictation errors may occur despite best attempts at proofreading.    Carrie Mew, MD 05/11/21 [id number]

## 2021-05-11 NOTE — Consult Note (Signed)
GI Inpatient Consult Note  Reason for Consult: Esophageal dysphagia   Attending Requesting Consult: Dr. Carrie Mew, MD  History of Present Illness: Nicholas Parker is a 87 y.o. male seen for evaluation of esophageal dysphagia at the request of ED physician - Dr. Carrie Mew. Pt has a PMH of HTN, HLD, AAA without rupture s/p endovascular repair 2015, CAD s/p multiple stents, CHF, ischemic cardiomyopathy, T2DM, Vit B12 deficiency, polyneuropathy, and Parkinson's disease. He presented to the [facility] ED this morning via personal vehicle with his wife for chief complaint of esophageal dysphagia. His wife called our clinic this morning due to concerns over not being able to tolerate any solid or liquid intake since Saturday. He reports he was eating steak Saturday night (05/07/21) and felt like after one of the first bites of steak that everything was building up. He reports he then started to vomit and a lot of thick, clear mucous came up. Since this time he has not been able to tolerate any solid or liquid intake. He reports he feels like yogurt or water would get down to level of his lower esophagus and then immediately come back up. He also reports a tightness and pressure sensation substernally. His blood sugar dropped to 58 and he called his PCP who advised him to go to the ED. Upon presentation to the ED, vital signs were stable. He is able to tolerate all of his secretions without any difficulty. He had chest x-ray performed which showed small right pleural effusion. He reports he has not had anything to eat today. Last tried to eat some yogurt last night and this wouldn't go down. He did have a few small sips of orange juice this morning and this has stayed down. He reports a longstanding history of GERD for which he has been on Protonix daily. He has been compliant with this medication. He reports he had previous food impaction in early 2018 for which Dr. Gustavo Lah had to perform EGD for removal. He  then had work-up in 2018 at [facility] where manometry showed Jackhammer esophagus, GEJ outflow obstruction, and distal esophageal stricture. He underwent EGDs x2 (02/2017 and 12/2016) with dilatations. He feels like over the past 6 months his dysphagia symptoms have progressed. He denies any odynophagia, early satiety, epigastric abdominal pain, dizziness, lightheadedness, shortness of breath, or syncope. No changes in his bowel habits.    Past Medical History:  Past Medical History:  Diagnosis Date   AAA (abdominal aortic aneurysm) ([place])    Adenocarcinoma ([place])    Anemia    B12 deficiency    Cardiomyopathy, ischemic 10/18/2015   Coronary artery disease    Diabetes mellitus without complication (HCC)    Diverticulosis    Dyspnea    GERD (gastroesophageal reflux disease)    Hyperlipidemia    Hypertension    Low testosterone    Myocardial infarction ([place]) 10/12/2015   neuropathy    Neuropathy    Skin cancer    Basal Cell   Ventricular tachycardia, sustained ([place]) 10/18/2015    Problem List: Patient Active Problem List   Diagnosis Date Noted   Parkinson's disease ([place]) 10/29/2019   Diabetic peripheral neuropathy associated with type 2 diabetes mellitus ([place]) 06/16/2019   Low testosterone 06/16/2019   AAA (abdominal aortic aneurysm) ([place]) 06/16/2019   Anemia 06/16/2019   B12 deficiency anemia 06/16/2019   Gastroesophageal reflux disease 06/16/2019   CAD (coronary artery disease) 06/16/2019   Onychomycosis of multiple toenails with type 2 diabetes mellitus (  [place]) 09/25/2018   Chronic kidney disease (CKD) stage G3a/A1, moderately decreased glomerular filtration rate (GFR) between 45-59 mL/min/1.73 square meter and albuminuria creatinine ratio less than 30 mg/g (HCC) 05/26/2018   Polyneuropathy due to medical condition ([place]) 05/26/2018   High risk medication use 08/23/2017   CHF (congestive heart failure), NYHA class II, chronic, systolic ([place]) 50/04/3817   Senile purpura  ([place]) 08/15/2016   Ischemic cardiomyopathy 06/13/2016   Abdominal aortic aneurysm (AAA) ([place]) 10/18/2015   Absolute anemia 10/18/2015   Vitamin B12 deficiency anemia 10/18/2015   Arteriosclerosis of coronary artery 10/18/2015   Acid reflux 10/18/2015   Hypotestosteronism 10/18/2015   Neuropathy 10/18/2015   Ventricular tachycardia, sustained ([place]) 10/18/2015   Cardiomyopathy, ischemic 10/18/2015   Myocardial infarction ([place]) 10/12/2015   Abdominal abscess 10/11/2015   Presence of stent in coronary artery 10/10/2015   Tubular adenoma of colon 10/10/2015   History of convulsions 10/10/2015   History of heart artery stent 10/10/2015   Seizure ([place])    Hypoglycemia 10/07/2015   Renal failure 09/24/2015   Acute ST elevation myocardial infarction (STEMI) involving right coronary artery ([place])    Colon polyp 09/14/2015   Abnormal bruising 08/12/2015   Type 2 diabetes mellitus ([place]) 08/11/2015   Benign essential HTN 02/04/2015   Hyperlipidemia 02/04/2015   Benign essential hypertension 02/04/2015   Hyperlipidemia, mixed 02/04/2015    Past Surgical History: Past Surgical History:  Procedure Laterality Date   ABDOMINAL AORTIC ANEURYSM REPAIR  2014   Dr. Delana Meyer, [facility]   arthrosopic rotator cuff repair Left    CARDIAC CATHETERIZATION N/A 09/17/2015   Procedure: Left Heart Cath and Coronary Angiography;  Surgeon: Wellington Hampshire, MD;  Location: [place] CV LAB;  Service: Cardiovascular;  Laterality: N/A;   CARDIAC CATHETERIZATION N/A 09/17/2015   Procedure: Coronary Stent Intervention;  Surgeon: Wellington Hampshire, MD;  Location: [place] CV LAB;  Service: Cardiovascular;  Laterality: N/A;   COLON SURGERY  05/2006   COLONOSCOPY WITH PROPOFOL N/A 04/13/2015   Procedure: COLONOSCOPY WITH PROPOFOL;  Surgeon: Lollie Sails, MD;  Location: [facility] ENDOSCOPY;  Service: Endoscopy;  Laterality: N/A;   COLONOSCOPY WITH PROPOFOL N/A 04/14/2015   Procedure: COLONOSCOPY WITH PROPOFOL;   Surgeon: Lollie Sails, MD;  Location: [facility] ENDOSCOPY;  Service: Endoscopy;  Laterality: N/A;   COLONOSCOPY WITH PROPOFOL N/A 07/20/2015   Procedure: COLONOSCOPY WITH PROPOFOL;  Surgeon: Lollie Sails, MD;  Location: [facility] ENDOSCOPY;  Service: Endoscopy;  Laterality: N/A;   CORONARY ANGIOPLASTY     ESOPHAGOGASTRODUODENOSCOPY N/A 08/29/2016   Procedure: ESOPHAGOGASTRODUODENOSCOPY (EGD) with removal of foreign body;  Surgeon: Lollie Sails, MD;  Location: [facility] ENDOSCOPY;  Service: Endoscopy;  Laterality: N/A;   ESOPHAGOGASTRODUODENOSCOPY (EGD) WITH PROPOFOL N/A 09/29/2016   Procedure: ESOPHAGOGASTRODUODENOSCOPY (EGD) WITH PROPOFOL;  Surgeon: Lollie Sails, MD;  Location: [facility] ENDOSCOPY;  Service: Endoscopy;  Laterality: N/A;   EYE SURGERY Bilateral    Cataract Extraction with IOL   LAPAROSCOPIC RIGHT COLECTOMY Right 09/14/2015   Procedure: LAPAROSCOPIC RIGHT COLECTOMY  CONVERTED TO OPEN RIGHT COLECTOMY, LYSIS OF ADHESIONS;  Surgeon: Leonie Green, MD;  Location: [facility] ORS;  Service: General;  Laterality: Right;   robotic colectomy     VASCULAR SURGERY      Allergies: Allergies  Allergen Reactions   Lipitor [Atorvastatin] Other (See Comments)    Reaction:  Leg cramps    Pravachol [Pravastatin Sodium] Other (See Comments)    Reaction:  Leg cramps   Riomet [Metformin Hcl] Diarrhea   Vytorin [  Ezetimibe-Simvastatin] Other (See Comments)    Reaction:  Leg cramps    Home Medications: (Not in a hospital admission)  Home medication reconciliation was completed with the patient.   Scheduled Inpatient Medications:    Continuous Inpatient Infusions:    PRN Inpatient Medications:    Family History: family history includes CAD in his mother; Diabetes type II in his mother; Glaucoma in an other family member; Heart attack in his mother; Lung cancer in his father.  The patient's family history is negative for inflammatory bowel disorders, GI malignancy, or solid organ  transplantation.  Social History:   reports that he quit smoking about 17 years ago. His smoking use included cigarettes. He smoked an average of 1.5 packs per day. He has never used smokeless tobacco. He reports current alcohol use of about 1.0 standard drink per week. He reports that he does not use drugs. The patient denies ETOH, tobacco, or drug use.   Review of Systems: Constitutional: Weight is stable.  Eyes: No changes in vision. ENT: No oral lesions, sore throat.  GI: see HPI.  Heme/Lymph: No easy bruising.  CV: No chest pain.  GU: No hematuria.  Integumentary: No rashes.  Neuro: No headaches.  Psych: No depression/anxiety.  Endocrine: No heat/cold intolerance.  Allergic/Immunologic: No urticaria.  Resp: No cough, SOB.  Musculoskeletal: No joint swelling.    Physical Examination: BP 127/62   Pulse 81   Temp 98.1 F (36.7 C)   Resp 18   Ht 5\' 6"  (1.676 m)   Wt 66 kg   SpO2 98%   BMI 23.48 kg/m  Non-toxic appearing male in ED stretcher. No accessory muscle use upon breathing. Wife present at bedside.  Gen: NAD, alert and oriented x 4 HEENT: PEERLA, EOMI, Neck: supple, no JVD or thyromegaly Chest: CTA bilaterally, no wheezes, crackles, or other adventitious sounds CV: RRR, no m/g/c/r Abd: soft, NT, ND, +BS in all four quadrants; no HSM, guarding, ridigity, or rebound tenderness Ext: no edema, well perfused with 2+ pulses, Skin: no rash or lesions noted Lymph: no LAD  Data: Lab Results  Component Value Date   WBC 6.9 05/11/2021   HGB 14.0 05/11/2021   HCT 41.9 05/11/2021   MCV 88.2 05/11/2021   PLT 288 05/11/2021   Recent Labs  Lab 05/11/21 1140  HGB 14.0   Lab Results  Component Value Date   NA 139 05/11/2021   K 3.8 05/11/2021   CL 97 (L) 05/11/2021   CO2 27 05/11/2021   BUN 16 05/11/2021   CREATININE 0.89 05/11/2021   Lab Results  Component Value Date   ALT 5 05/11/2021   AST 13 (L) 05/11/2021   ALKPHOS 59 05/11/2021   BILITOT 1.8 (H)  05/11/2021   No results for input(s): APTT, INR, PTT in the last 168 hours.  EGD 09/2016 -  done with infant scope, as adult scope would not pass due to narrow and stenotic esophagus: did find food and pill debris, biopsy of GEJ done and no dysplasia or maligancy found. He was then referred to [facility] for manometry.  HRM 09/2016 - esophageal manometry demonstrated hypertensive peristalsis/ jackhammer esophagus (90% hypercontractile swallows, LES residual 54mmHg, resting 64mmHg, DCI 15,000-19,000).  Assessment/Plan:  87 y/o Caucasian male with a PMH of HTN, HLD, AAA without rupture s/p endovascular repair 2015, CAD s/p multiple stents, CHF, ischemic cardiomyopathy, T2DM, Vit B12 deficiency, polyneuropathy, and Parkinson's disease presented to the [facility] ED this morning for chief complaint of acute esophageal dysphagia c/w esophageal  food obstruction/food impaction   Esophageal dysphagia Esophageal food obstruction 2/2 food impaction History of esophageal stricture s/p dilatation (02/2017, 12/2016) Jackhammer esophagus + GEJ outflow obstruction   - Symptoms highly c/w esophageal food obstruction 2/2 food impaction given his clinical presentation and physical exam. He has a history of food impaction requiring EGD for food bolus removal in January 2018. He very likely has a food impaction which will require EGD for removal.  - Clinically, all vital signs are stable and he is able to tolerate his own secretions without difficulty. Chest x-ray showed small right pleural effusion. - Advise EGD this afternoon with Dr. Alice Reichert for food bolus removal and potential esophageal dilatation if distal esophageal stricture is present. I discussed procedure details and indications with patient and wife and they are both agreeable to proceed.  - Given his history of motility disorder, he might benefit from follow-up with [facility] to discuss repeat manometry +/- therapeutic options - Further recommendations  after EGD - Strictly NPO status  I reviewed the risks (including bleeding, perforation, infection, anesthesia complications, cardiac/respiratory complications), benefits and alternatives of EGD. Patient consents to proceed.    Thank you for the consult. Please call with questions or concerns.  Reeves Forth [facility] Gastroenterology [phone] (Cell)

## 2021-05-11 NOTE — ED Triage Notes (Signed)
Pt RR even and unlabored in triage. Swallowing saliva. NAD noted

## 2021-05-11 NOTE — Anesthesia Procedure Notes (Signed)
Procedure Name: Intubation Date/Time: 05/11/2021 3:29 PM Performed by: Jonna Clark, CRNA Pre-anesthesia Checklist: Patient identified, Patient being monitored, Timeout performed, Emergency Drugs available and Suction available Patient Re-evaluated:Patient Re-evaluated prior to induction Oxygen Delivery Method: Circle system utilized Preoxygenation: Pre-oxygenation with 100% oxygen Induction Type: IV induction Ventilation: Mask ventilation without difficulty Laryngoscope Size: 3 and McGraph Grade View: Grade I Tube type: Oral Tube size: 6.5 mm Number of attempts: 1 Airway Equipment and Method: Stylet Placement Confirmation: ETT inserted through vocal cords under direct vision, positive ETCO2 and breath sounds checked- equal and bilateral Secured at: 21 cm Tube secured with: Tape Dental Injury: Teeth and Oropharynx as per pre-operative assessment

## 2021-05-11 NOTE — Transfer of Care (Signed)
Immediate Anesthesia Transfer of Care Note  Patient: Nicholas Parker  Procedure(s) Performed: ESOPHAGOGASTRODUODENOSCOPY (EGD)  Patient Location: PACU  Anesthesia Type:General  Level of Consciousness: drowsy and patient cooperative  Airway & Oxygen Therapy: Patient Spontanous Breathing and Patient connected to nasal cannula oxygen  Post-op Assessment: Report given to RN and Post -op Vital signs reviewed and stable  Post vital signs: Reviewed and stable  Last Vitals:  Vitals Value Taken Time  BP 126/56 05/11/21 1551  Temp 36.6 C 05/11/21 1551  Pulse 77 05/11/21 1558  Resp 23 05/11/21 1558  SpO2 99 % 05/11/21 1558  Vitals shown include unvalidated device data.  Last Pain:  Vitals:   05/11/21 1551  TempSrc:   PainSc: 0-No pain         Complications: No notable events documented.

## 2021-05-11 NOTE — ED Triage Notes (Signed)
Pt comes into the ED via POV c/o difficulty swallowing x 1 week.  Pt has h/o esophageal stretching in the past.  Pt states he has gotten to the point where he is having a difficult time getting fluids down.  Pt ambulatory to triage at this time and in NAD.

## 2021-05-11 NOTE — Op Note (Signed)
[facility] Gastroenterology Patient Name: Nicholas Parker Procedure Date: 05/11/2021 3:22 PM MRN: [id number] Account #: [id number] Date of Birth: 02/15/1934 Admit Type: Inpatient Age: 87 Room: [facility] ENDO ROOM 2 Gender: Male Note Status: Finalized Instrument Name: Upper Endoscope [id number] Procedure:             Upper GI endoscopy Indications:           Dysphagia, Presumed esophageal foreign body Providers:             Benay Pike. Kriti Katayama MD, MD Complications:         No immediate complications. Procedure:             Pre-Anesthesia Assessment:                        - The risks and benefits of the procedure and the                         sedation options and risks were discussed with the                         patient. All questions were answered and informed                         consent was obtained.                        - Patient identification and proposed procedure were                         verified prior to the procedure by the nurse. The                         procedure was verified in the procedure room.                        - ASA Grade Assessment: III - A patient with severe                         systemic disease.                        - After reviewing the risks and benefits, the patient                         was deemed in satisfactory condition to undergo the                         procedure.                        After obtaining informed consent, the endoscope was                         passed under direct vision. Throughout the procedure,                         the patient's blood pressure, pulse, and oxygen  saturations were monitored continuously. The Endoscope                         was introduced through the mouth, and advanced to the                         third part of duodenum. The upper GI endoscopy was                         accomplished without difficulty. The patient tolerated                          the procedure well. Findings:      No endoscopic abnormality was evident in the esophagus to explain the       patient's complaint of dysphagia. It was decided, however, to proceed       with dilation in the distal esophagus. The scope was withdrawn. Dilation       was performed with a Maloney dilator with mild resistance at 49 Fr.      There is no endoscopic evidence of areas of erosion, inflammation,       stenosis, stricture, foreign body or mass in the entire esophagus.      A 1 cm hiatal hernia was present.      Patchy mildly erythematous mucosa without bleeding was found in the       gastric antrum.      The examined duodenum was normal.      The exam was otherwise without abnormality. Impression:            - No endoscopic esophageal abnormality to explain                         patient's dysphagia. Esophagus dilated. Dilated.                        - 1 cm hiatal hernia.                        - Erythematous mucosa in the antrum.                        - Normal examined duodenum.                        - The examination was otherwise normal.                        - No specimens collected. Recommendation:        - Patient has a contact number available for                         emergencies. The signs and symptoms of potential                         delayed complications were discussed with the patient.                         Return to normal activities tomorrow. Written  discharge instructions were provided to the patient.                        - Full liquid diet today.                        - Advance diet as tolerated for 1 day.                        - Continue present medications.                        - Return to GI office in 4 weeks.                        - The findings and recommendations were discussed with                         the patient.                        - The findings and recommendations were discussed with                          the patient and their spouse. Procedure Code(s):     --- Professional ---                        [id number], Esophagogastroduodenoscopy, flexible,                         transoral; diagnostic, including collection of                         specimen(s) by brushing or washing, when performed                         (separate procedure)                        43450, Dilation of esophagus, by unguided sound or                         bougie, single or multiple passes Diagnosis Code(s):     --- Professional ---                        K31.89, Other diseases of stomach and duodenum                        K44.9, Diaphragmatic hernia without obstruction or                         gangrene                        R13.10, Dysphagia, unspecified CPT copyright 2019 [employer]. All rights reserved. The codes documented in this report are preliminary and upon coder review may  be revised to meet current compliance requirements. Efrain Sella MD, MD 05/11/2021 3:42:47 PM This report has been signed electronically. Number of Addenda: 0 Note Initiated On: 05/11/2021 3:22 PM Estimated Blood Loss:  Estimated blood loss: none.      [facility]

## 2021-05-11 NOTE — Progress Notes (Signed)
FSBS was 52. Dr. Doyne Keel was notified of this via telephone. An order for an half amp of dextrose was given and carried out by this RN. Dr. Andree Elk has rounded on patient and given the okay to proceed with procedure and to recheck FSBS in recovery.

## 2021-05-11 NOTE — Anesthesia Preprocedure Evaluation (Signed)
Anesthesia Evaluation  Patient identified by MRN, date of birth, ID band Patient awake    Reviewed: Allergy & Precautions, H&P , NPO status , Patient's Chart, lab work & pertinent test results, reviewed documented beta blocker date and time   Airway Mallampati: II  TM Distance: >3 FB Neck ROM: full    Dental  (+) Teeth Intact   Pulmonary shortness of breath and with exertion, former smoker,    Pulmonary exam normal        Cardiovascular Exercise Tolerance: Poor hypertension, On Medications + CAD, + Past MI, + Peripheral Vascular Disease and +CHF  Normal cardiovascular exam Rhythm:regular Rate:Normal     Neuro/Psych Seizures -, Well Controlled,   Neuromuscular disease negative psych ROS   GI/Hepatic Neg liver ROS, GERD  ,  Endo/Other  negative endocrine ROSdiabetes, Poorly Controlled, Type 1, Insulin Dependent  Renal/GU Renal disease  negative genitourinary   Musculoskeletal   Abdominal   Peds  Hematology  (+) Blood dyscrasia, anemia ,   Anesthesia Other Findings Past Medical History: No date: AAA (abdominal aortic aneurysm) (HCC) No date: Adenocarcinoma ([place]) No date: Anemia No date: B12 deficiency 10/18/2015: Cardiomyopathy, ischemic No date: Coronary artery disease No date: Diabetes mellitus without complication (HCC) No date: Diverticulosis No date: Dyspnea No date: GERD (gastroesophageal reflux disease) No date: Hyperlipidemia No date: Hypertension No date: Low testosterone 10/12/2015: Myocardial infarction ([place]) No date: neuropathy No date: Neuropathy No date: Skin cancer     Comment:  Basal Cell 10/18/2015: Ventricular tachycardia, sustained ([place]) Past Surgical History: 2014: ABDOMINAL AORTIC ANEURYSM REPAIR     Comment:  Dr. Delana Meyer, [facility] No date: arthrosopic rotator cuff repair; Left 09/17/2015: CARDIAC CATHETERIZATION; N/A     Comment:  Procedure: Left Heart Cath and Coronary Angiography;                 Surgeon: Wellington Hampshire, MD;  Location: [place]               CV LAB;  Service: Cardiovascular;  Laterality: N/A; 09/17/2015: CARDIAC CATHETERIZATION; N/A     Comment:  Procedure: Coronary Stent Intervention;  Surgeon:               Wellington Hampshire, MD;  Location: [place] CV LAB;                Service: Cardiovascular;  Laterality: N/A; 05/2006: COLON SURGERY 04/13/2015: COLONOSCOPY WITH PROPOFOL; N/A     Comment:  Procedure: COLONOSCOPY WITH PROPOFOL;  Surgeon: Lollie Sails, MD;  Location: [facility] ENDOSCOPY;  Service:               Endoscopy;  Laterality: N/A; 04/14/2015: COLONOSCOPY WITH PROPOFOL; N/A     Comment:  Procedure: COLONOSCOPY WITH PROPOFOL;  Surgeon: Lollie Sails, MD;  Location: [facility] ENDOSCOPY;  Service:               Endoscopy;  Laterality: N/A; 07/20/2015: COLONOSCOPY WITH PROPOFOL; N/A     Comment:  Procedure: COLONOSCOPY WITH PROPOFOL;  Surgeon: Lollie Sails, MD;  Location: [facility] ENDOSCOPY;  Service:               Endoscopy;  Laterality: N/A; No date: CORONARY ANGIOPLASTY 08/29/2016: ESOPHAGOGASTRODUODENOSCOPY; N/A     Comment:  Procedure:  ESOPHAGOGASTRODUODENOSCOPY (EGD) with removal              of foreign body;  Surgeon: Lollie Sails, MD;                Location: [facility] ENDOSCOPY;  Service: Endoscopy;                Laterality: N/A; 09/29/2016: ESOPHAGOGASTRODUODENOSCOPY (EGD) WITH PROPOFOL; N/A     Comment:  Procedure: ESOPHAGOGASTRODUODENOSCOPY (EGD) WITH               PROPOFOL;  Surgeon: Lollie Sails, MD;  Location:               [facility] ENDOSCOPY;  Service: Endoscopy;  Laterality: N/A; No date: EYE SURGERY; Bilateral     Comment:  Cataract Extraction with IOL 09/14/2015: LAPAROSCOPIC RIGHT COLECTOMY; Right     Comment:  Procedure: LAPAROSCOPIC RIGHT COLECTOMY  CONVERTED TO               OPEN RIGHT COLECTOMY, LYSIS OF ADHESIONS;  Surgeon:               Leonie Green, MD;  Location:  [facility] ORS;  Service:               General;  Laterality: Right; No date: robotic colectomy No date: VASCULAR SURGERY BMI    Body Mass Index: 23.48 kg/m     Reproductive/Obstetrics negative OB ROS                             Anesthesia Physical Anesthesia Plan  ASA: 4 and emergent  Anesthesia Plan: General ETT   Post-op Pain Management:    Induction:   PONV Risk Score and Plan:   Airway Management Planned:   Additional Equipment:   Intra-op Plan:   Post-operative Plan:   Informed Consent: I have reviewed the patients History and Physical, chart, labs and discussed the procedure including the risks, benefits and alternatives for the proposed anesthesia with the patient or authorized representative who has indicated his/her understanding and acceptance.     Dental Advisory Given  Plan Discussed with: CRNA  Anesthesia Plan Comments:         Anesthesia Quick Evaluation

## 2021-05-11 NOTE — Interval H&P Note (Signed)
History and Physical Interval Note:  05/11/2021 3:53 PM  Nicholas Parker  has presented today for surgery, with the diagnosis of Esophageal dysphagia, esophageal obstruction/food impaction.  The various methods of treatment have been discussed with the patient and family. After consideration of risks, benefits and other options for treatment, the patient has consented to  Procedure(s): ESOPHAGOGASTRODUODENOSCOPY (EGD) (N/A) as a surgical intervention.  The patient's history has been reviewed, patient examined, no change in status, stable for surgery.  I have reviewed the patient's chart and labs.  Questions were answered to the patient's satisfaction.     [place], [place]

## 2021-05-11 NOTE — H&P (View-Only) (Signed)
GI Inpatient Consult Note  Reason for Consult: Esophageal dysphagia   Attending Requesting Consult: Dr. Carrie Mew, MD  History of Present Illness: Nicholas Parker is a 87 y.o. male seen for evaluation of esophageal dysphagia at the request of ED physician - Dr. Carrie Mew. Pt has a PMH of HTN, HLD, AAA without rupture s/p endovascular repair 2015, CAD s/p multiple stents, CHF, ischemic cardiomyopathy, T2DM, Vit B12 deficiency, polyneuropathy, and Parkinson's disease. He presented to the [facility] ED this morning via personal vehicle with his wife for chief complaint of esophageal dysphagia. His wife called our clinic this morning due to concerns over not being able to tolerate any solid or liquid intake since Saturday. He reports he was eating steak Saturday night (05/07/21) and felt like after one of the first bites of steak that everything was building up. He reports he then started to vomit and a lot of thick, clear mucous came up. Since this time he has not been able to tolerate any solid or liquid intake. He reports he feels like yogurt or water would get down to level of his lower esophagus and then immediately come back up. He also reports a tightness and pressure sensation substernally. His blood sugar dropped to 58 and he called his PCP who advised him to go to the ED. Upon presentation to the ED, vital signs were stable. He is able to tolerate all of his secretions without any difficulty. He had chest x-ray performed which showed small right pleural effusion. He reports he has not had anything to eat today. Last tried to eat some yogurt last night and this wouldn't go down. He did have a few small sips of orange juice this morning and this has stayed down. He reports a longstanding history of GERD for which he has been on Protonix daily. He has been compliant with this medication. He reports he had previous food impaction in early 2018 for which Dr. Gustavo Lah had to perform EGD for removal. He  then had work-up in 2018 at [facility] where manometry showed Jackhammer esophagus, GEJ outflow obstruction, and distal esophageal stricture. He underwent EGDs x2 (02/2017 and 12/2016) with dilatations. He feels like over the past 6 months his dysphagia symptoms have progressed. He denies any odynophagia, early satiety, epigastric abdominal pain, dizziness, lightheadedness, shortness of breath, or syncope. No changes in his bowel habits.    Past Medical History:  Past Medical History:  Diagnosis Date   AAA (abdominal aortic aneurysm) ([place])    Adenocarcinoma ([place])    Anemia    B12 deficiency    Cardiomyopathy, ischemic 10/18/2015   Coronary artery disease    Diabetes mellitus without complication (HCC)    Diverticulosis    Dyspnea    GERD (gastroesophageal reflux disease)    Hyperlipidemia    Hypertension    Low testosterone    Myocardial infarction ([place]) 10/12/2015   neuropathy    Neuropathy    Skin cancer    Basal Cell   Ventricular tachycardia, sustained ([place]) 10/18/2015    Problem List: Patient Active Problem List   Diagnosis Date Noted   Parkinson's disease ([place]) 10/29/2019   Diabetic peripheral neuropathy associated with type 2 diabetes mellitus ([place]) 06/16/2019   Low testosterone 06/16/2019   AAA (abdominal aortic aneurysm) ([place]) 06/16/2019   Anemia 06/16/2019   B12 deficiency anemia 06/16/2019   Gastroesophageal reflux disease 06/16/2019   CAD (coronary artery disease) 06/16/2019   Onychomycosis of multiple toenails with type 2 diabetes mellitus (  [place]) 09/25/2018   Chronic kidney disease (CKD) stage G3a/A1, moderately decreased glomerular filtration rate (GFR) between 45-59 mL/min/1.73 square meter and albuminuria creatinine ratio less than 30 mg/g (HCC) 05/26/2018   Polyneuropathy due to medical condition ([place]) 05/26/2018   High risk medication use 08/23/2017   CHF (congestive heart failure), NYHA class II, chronic, systolic ([place]) 37/62/8315   Senile purpura  ([place]) 08/15/2016   Ischemic cardiomyopathy 06/13/2016   Abdominal aortic aneurysm (AAA) ([place]) 10/18/2015   Absolute anemia 10/18/2015   Vitamin B12 deficiency anemia 10/18/2015   Arteriosclerosis of coronary artery 10/18/2015   Acid reflux 10/18/2015   Hypotestosteronism 10/18/2015   Neuropathy 10/18/2015   Ventricular tachycardia, sustained ([place]) 10/18/2015   Cardiomyopathy, ischemic 10/18/2015   Myocardial infarction ([place]) 10/12/2015   Abdominal abscess 10/11/2015   Presence of stent in coronary artery 10/10/2015   Tubular adenoma of colon 10/10/2015   History of convulsions 10/10/2015   History of heart artery stent 10/10/2015   Seizure ([place])    Hypoglycemia 10/07/2015   Renal failure 09/24/2015   Acute ST elevation myocardial infarction (STEMI) involving right coronary artery ([place])    Colon polyp 09/14/2015   Abnormal bruising 08/12/2015   Type 2 diabetes mellitus ([place]) 08/11/2015   Benign essential HTN 02/04/2015   Hyperlipidemia 02/04/2015   Benign essential hypertension 02/04/2015   Hyperlipidemia, mixed 02/04/2015    Past Surgical History: Past Surgical History:  Procedure Laterality Date   ABDOMINAL AORTIC ANEURYSM REPAIR  2014   Dr. Delana Meyer, [facility]   arthrosopic rotator cuff repair Left    CARDIAC CATHETERIZATION N/A 09/17/2015   Procedure: Left Heart Cath and Coronary Angiography;  Surgeon: Wellington Hampshire, MD;  Location: [place] CV LAB;  Service: Cardiovascular;  Laterality: N/A;   CARDIAC CATHETERIZATION N/A 09/17/2015   Procedure: Coronary Stent Intervention;  Surgeon: Wellington Hampshire, MD;  Location: [place] CV LAB;  Service: Cardiovascular;  Laterality: N/A;   COLON SURGERY  05/2006   COLONOSCOPY WITH PROPOFOL N/A 04/13/2015   Procedure: COLONOSCOPY WITH PROPOFOL;  Surgeon: Lollie Sails, MD;  Location: [facility] ENDOSCOPY;  Service: Endoscopy;  Laterality: N/A;   COLONOSCOPY WITH PROPOFOL N/A 04/14/2015   Procedure: COLONOSCOPY WITH PROPOFOL;   Surgeon: Lollie Sails, MD;  Location: [facility] ENDOSCOPY;  Service: Endoscopy;  Laterality: N/A;   COLONOSCOPY WITH PROPOFOL N/A 07/20/2015   Procedure: COLONOSCOPY WITH PROPOFOL;  Surgeon: Lollie Sails, MD;  Location: [facility] ENDOSCOPY;  Service: Endoscopy;  Laterality: N/A;   CORONARY ANGIOPLASTY     ESOPHAGOGASTRODUODENOSCOPY N/A 08/29/2016   Procedure: ESOPHAGOGASTRODUODENOSCOPY (EGD) with removal of foreign body;  Surgeon: Lollie Sails, MD;  Location: [facility] ENDOSCOPY;  Service: Endoscopy;  Laterality: N/A;   ESOPHAGOGASTRODUODENOSCOPY (EGD) WITH PROPOFOL N/A 09/29/2016   Procedure: ESOPHAGOGASTRODUODENOSCOPY (EGD) WITH PROPOFOL;  Surgeon: Lollie Sails, MD;  Location: [facility] ENDOSCOPY;  Service: Endoscopy;  Laterality: N/A;   EYE SURGERY Bilateral    Cataract Extraction with IOL   LAPAROSCOPIC RIGHT COLECTOMY Right 09/14/2015   Procedure: LAPAROSCOPIC RIGHT COLECTOMY  CONVERTED TO OPEN RIGHT COLECTOMY, LYSIS OF ADHESIONS;  Surgeon: Leonie Green, MD;  Location: [facility] ORS;  Service: General;  Laterality: Right;   robotic colectomy     VASCULAR SURGERY      Allergies: Allergies  Allergen Reactions   Lipitor [Atorvastatin] Other (See Comments)    Reaction:  Leg cramps    Pravachol [Pravastatin Sodium] Other (See Comments)    Reaction:  Leg cramps   Riomet [Metformin Hcl] Diarrhea   Vytorin [  Ezetimibe-Simvastatin] Other (See Comments)    Reaction:  Leg cramps    Home Medications: (Not in a hospital admission)  Home medication reconciliation was completed with the patient.   Scheduled Inpatient Medications:    Continuous Inpatient Infusions:    PRN Inpatient Medications:    Family History: family history includes CAD in his mother; Diabetes type II in his mother; Glaucoma in an other family member; Heart attack in his mother; Lung cancer in his father.  The patient's family history is negative for inflammatory bowel disorders, GI malignancy, or solid organ  transplantation.  Social History:   reports that he quit smoking about 17 years ago. His smoking use included cigarettes. He smoked an average of 1.5 packs per day. He has never used smokeless tobacco. He reports current alcohol use of about 1.0 standard drink per week. He reports that he does not use drugs. The patient denies ETOH, tobacco, or drug use.   Review of Systems: Constitutional: Weight is stable.  Eyes: No changes in vision. ENT: No oral lesions, sore throat.  GI: see HPI.  Heme/Lymph: No easy bruising.  CV: No chest pain.  GU: No hematuria.  Integumentary: No rashes.  Neuro: No headaches.  Psych: No depression/anxiety.  Endocrine: No heat/cold intolerance.  Allergic/Immunologic: No urticaria.  Resp: No cough, SOB.  Musculoskeletal: No joint swelling.    Physical Examination: BP 127/62   Pulse 81   Temp 98.1 F (36.7 C)   Resp 18   Ht 5\' 6"  (1.676 m)   Wt 66 kg   SpO2 98%   BMI 23.48 kg/m  Non-toxic appearing male in ED stretcher. No accessory muscle use upon breathing. Wife present at bedside.  Gen: NAD, alert and oriented x 4 HEENT: PEERLA, EOMI, Neck: supple, no JVD or thyromegaly Chest: CTA bilaterally, no wheezes, crackles, or other adventitious sounds CV: RRR, no m/g/c/r Abd: soft, NT, ND, +BS in all four quadrants; no HSM, guarding, ridigity, or rebound tenderness Ext: no edema, well perfused with 2+ pulses, Skin: no rash or lesions noted Lymph: no LAD  Data: Lab Results  Component Value Date   WBC 6.9 05/11/2021   HGB 14.0 05/11/2021   HCT 41.9 05/11/2021   MCV 88.2 05/11/2021   PLT 288 05/11/2021   Recent Labs  Lab 05/11/21 1140  HGB 14.0   Lab Results  Component Value Date   NA 139 05/11/2021   K 3.8 05/11/2021   CL 97 (L) 05/11/2021   CO2 27 05/11/2021   BUN 16 05/11/2021   CREATININE 0.89 05/11/2021   Lab Results  Component Value Date   ALT 5 05/11/2021   AST 13 (L) 05/11/2021   ALKPHOS 59 05/11/2021   BILITOT 1.8 (H)  05/11/2021   No results for input(s): APTT, INR, PTT in the last 168 hours.  EGD 09/2016 -  done with infant scope, as adult scope would not pass due to narrow and stenotic esophagus: did find food and pill debris, biopsy of GEJ done and no dysplasia or maligancy found. He was then referred to [facility] for manometry.  HRM 09/2016 - esophageal manometry demonstrated hypertensive peristalsis/ jackhammer esophagus (90% hypercontractile swallows, LES residual 49mmHg, resting 62mmHg, DCI 15,000-19,000).  Assessment/Plan:  87 y/o Caucasian male with a PMH of HTN, HLD, AAA without rupture s/p endovascular repair 2015, CAD s/p multiple stents, CHF, ischemic cardiomyopathy, T2DM, Vit B12 deficiency, polyneuropathy, and Parkinson's disease presented to the [facility] ED this morning for chief complaint of acute esophageal dysphagia c/w esophageal  food obstruction/food impaction   Esophageal dysphagia Esophageal food obstruction 2/2 food impaction History of esophageal stricture s/p dilatation (02/2017, 12/2016) Jackhammer esophagus + GEJ outflow obstruction   - Symptoms highly c/w esophageal food obstruction 2/2 food impaction given his clinical presentation and physical exam. He has a history of food impaction requiring EGD for food bolus removal in January 2018. He very likely has a food impaction which will require EGD for removal.  - Clinically, all vital signs are stable and he is able to tolerate his own secretions without difficulty. Chest x-ray showed small right pleural effusion. - Advise EGD this afternoon with Dr. Alice Reichert for food bolus removal and potential esophageal dilatation if distal esophageal stricture is present. I discussed procedure details and indications with patient and wife and they are both agreeable to proceed.  - Given his history of motility disorder, he might benefit from follow-up with [facility] to discuss repeat manometry +/- therapeutic options - Further recommendations  after EGD - Strictly NPO status  I reviewed the risks (including bleeding, perforation, infection, anesthesia complications, cardiac/respiratory complications), benefits and alternatives of EGD. Patient consents to proceed.    Thank you for the consult. Please call with questions or concerns.  [facility] [phone] (Cell)

## 2021-05-12 ENCOUNTER — Encounter: Payer: Self-pay | Admitting: Internal Medicine

## 2021-05-12 LAB — CBG MONITORING, ED: Glucose-Capillary: 88 mg/dL (ref 70–99)

## 2021-05-12 NOTE — Anesthesia Postprocedure Evaluation (Signed)
Anesthesia Post Note  Patient: Nicholas Parker  Procedure(s) Performed: ESOPHAGOGASTRODUODENOSCOPY (EGD)  Patient location during evaluation: PACU Anesthesia Type: General Level of consciousness: awake and alert Pain management: pain level controlled Vital Signs Assessment: post-procedure vital signs reviewed and stable Respiratory status: spontaneous breathing, nonlabored ventilation, respiratory function stable and patient connected to nasal cannula oxygen Cardiovascular status: blood pressure returned to baseline and stable Postop Assessment: no apparent nausea or vomiting Anesthetic complications: no   No notable events documented.   Last Vitals:  Vitals:   05/11/21 1611 05/11/21 1619  BP:  (!) 135/50  Pulse: 79 72  Resp: (!) 28 18  Temp: (!) 36.2 C (!) 36.2 C  SpO2: 98%     Last Pain:  Vitals:   05/11/21 1619  TempSrc: Temporal  PainSc: 0-No pain                 Molli Barrows

## 2021-05-31 DIAGNOSIS — D649 Anemia, unspecified: Secondary | ICD-10-CM | POA: Diagnosis not present

## 2021-05-31 DIAGNOSIS — D518 Other vitamin B12 deficiency anemias: Secondary | ICD-10-CM | POA: Diagnosis not present

## 2021-05-31 DIAGNOSIS — E118 Type 2 diabetes mellitus with unspecified complications: Secondary | ICD-10-CM | POA: Diagnosis not present

## 2021-05-31 DIAGNOSIS — E782 Mixed hyperlipidemia: Secondary | ICD-10-CM | POA: Diagnosis not present

## 2021-05-31 DIAGNOSIS — I10 Essential (primary) hypertension: Secondary | ICD-10-CM | POA: Diagnosis not present

## 2021-06-07 DIAGNOSIS — D692 Other nonthrombocytopenic purpura: Secondary | ICD-10-CM | POA: Diagnosis not present

## 2021-06-07 DIAGNOSIS — I5022 Chronic systolic (congestive) heart failure: Secondary | ICD-10-CM | POA: Diagnosis not present

## 2021-06-07 DIAGNOSIS — Z79899 Other long term (current) drug therapy: Secondary | ICD-10-CM | POA: Diagnosis not present

## 2021-06-07 DIAGNOSIS — G20 Parkinson's disease: Secondary | ICD-10-CM | POA: Diagnosis not present

## 2021-06-07 DIAGNOSIS — E1169 Type 2 diabetes mellitus with other specified complication: Secondary | ICD-10-CM | POA: Diagnosis not present

## 2021-06-07 DIAGNOSIS — I110 Hypertensive heart disease with heart failure: Secondary | ICD-10-CM | POA: Diagnosis not present

## 2021-06-07 DIAGNOSIS — I2510 Atherosclerotic heart disease of native coronary artery without angina pectoris: Secondary | ICD-10-CM | POA: Diagnosis not present

## 2021-06-07 DIAGNOSIS — Z8679 Personal history of other diseases of the circulatory system: Secondary | ICD-10-CM | POA: Diagnosis not present

## 2021-06-07 DIAGNOSIS — E1142 Type 2 diabetes mellitus with diabetic polyneuropathy: Secondary | ICD-10-CM | POA: Diagnosis not present

## 2021-06-16 ENCOUNTER — Ambulatory Visit (INDEPENDENT_AMBULATORY_CARE_PROVIDER_SITE_OTHER): Payer: Medicare HMO | Admitting: Vascular Surgery

## 2021-06-16 ENCOUNTER — Other Ambulatory Visit: Payer: Self-pay

## 2021-06-16 ENCOUNTER — Ambulatory Visit (INDEPENDENT_AMBULATORY_CARE_PROVIDER_SITE_OTHER): Payer: Medicare HMO

## 2021-06-16 DIAGNOSIS — I714 Abdominal aortic aneurysm, without rupture, unspecified: Secondary | ICD-10-CM

## 2021-06-17 ENCOUNTER — Encounter (INDEPENDENT_AMBULATORY_CARE_PROVIDER_SITE_OTHER): Payer: Self-pay | Admitting: *Deleted

## 2021-06-22 DIAGNOSIS — R1313 Dysphagia, pharyngeal phase: Secondary | ICD-10-CM | POA: Diagnosis not present

## 2021-07-05 DIAGNOSIS — D2261 Melanocytic nevi of right upper limb, including shoulder: Secondary | ICD-10-CM | POA: Diagnosis not present

## 2021-07-05 DIAGNOSIS — D2262 Melanocytic nevi of left upper limb, including shoulder: Secondary | ICD-10-CM | POA: Diagnosis not present

## 2021-07-05 DIAGNOSIS — D2339 Other benign neoplasm of skin of other parts of face: Secondary | ICD-10-CM | POA: Diagnosis not present

## 2021-07-05 DIAGNOSIS — Z85828 Personal history of other malignant neoplasm of skin: Secondary | ICD-10-CM | POA: Diagnosis not present

## 2021-07-05 DIAGNOSIS — L570 Actinic keratosis: Secondary | ICD-10-CM | POA: Diagnosis not present

## 2021-07-05 DIAGNOSIS — R208 Other disturbances of skin sensation: Secondary | ICD-10-CM | POA: Diagnosis not present

## 2021-07-05 DIAGNOSIS — L821 Other seborrheic keratosis: Secondary | ICD-10-CM | POA: Diagnosis not present

## 2021-07-05 DIAGNOSIS — D485 Neoplasm of uncertain behavior of skin: Secondary | ICD-10-CM | POA: Diagnosis not present

## 2021-09-20 DIAGNOSIS — I5032 Chronic diastolic (congestive) heart failure: Secondary | ICD-10-CM | POA: Diagnosis not present

## 2021-09-20 DIAGNOSIS — R2243 Localized swelling, mass and lump, lower limb, bilateral: Secondary | ICD-10-CM | POA: Diagnosis not present

## 2021-09-20 DIAGNOSIS — J811 Chronic pulmonary edema: Secondary | ICD-10-CM | POA: Diagnosis not present

## 2021-09-20 DIAGNOSIS — R0602 Shortness of breath: Secondary | ICD-10-CM | POA: Diagnosis not present

## 2021-09-20 DIAGNOSIS — J90 Pleural effusion, not elsewhere classified: Secondary | ICD-10-CM | POA: Diagnosis not present

## 2021-09-20 DIAGNOSIS — I517 Cardiomegaly: Secondary | ICD-10-CM | POA: Diagnosis not present

## 2021-09-20 DIAGNOSIS — M7989 Other specified soft tissue disorders: Secondary | ICD-10-CM | POA: Diagnosis not present

## 2021-09-20 DIAGNOSIS — J9811 Atelectasis: Secondary | ICD-10-CM | POA: Diagnosis not present

## 2021-10-04 DIAGNOSIS — E1142 Type 2 diabetes mellitus with diabetic polyneuropathy: Secondary | ICD-10-CM | POA: Diagnosis not present

## 2021-10-04 DIAGNOSIS — G20 Parkinson's disease: Secondary | ICD-10-CM | POA: Diagnosis not present

## 2021-10-04 DIAGNOSIS — I110 Hypertensive heart disease with heart failure: Secondary | ICD-10-CM | POA: Diagnosis not present

## 2021-10-04 DIAGNOSIS — E538 Deficiency of other specified B group vitamins: Secondary | ICD-10-CM | POA: Diagnosis not present

## 2021-10-04 DIAGNOSIS — I2510 Atherosclerotic heart disease of native coronary artery without angina pectoris: Secondary | ICD-10-CM | POA: Diagnosis not present

## 2021-10-04 DIAGNOSIS — I5022 Chronic systolic (congestive) heart failure: Secondary | ICD-10-CM | POA: Diagnosis not present

## 2021-10-04 DIAGNOSIS — E1169 Type 2 diabetes mellitus with other specified complication: Secondary | ICD-10-CM | POA: Diagnosis not present

## 2021-10-04 DIAGNOSIS — E1151 Type 2 diabetes mellitus with diabetic peripheral angiopathy without gangrene: Secondary | ICD-10-CM | POA: Diagnosis not present

## 2021-10-04 DIAGNOSIS — E782 Mixed hyperlipidemia: Secondary | ICD-10-CM | POA: Diagnosis not present

## 2021-10-06 DIAGNOSIS — G4752 REM sleep behavior disorder: Secondary | ICD-10-CM | POA: Diagnosis not present

## 2021-10-06 DIAGNOSIS — R4189 Other symptoms and signs involving cognitive functions and awareness: Secondary | ICD-10-CM | POA: Diagnosis not present

## 2021-10-06 DIAGNOSIS — G20 Parkinson's disease: Secondary | ICD-10-CM | POA: Diagnosis not present

## 2021-10-06 DIAGNOSIS — R630 Anorexia: Secondary | ICD-10-CM | POA: Diagnosis not present

## 2021-10-17 DIAGNOSIS — I5032 Chronic diastolic (congestive) heart failure: Secondary | ICD-10-CM | POA: Diagnosis not present

## 2021-10-17 DIAGNOSIS — M7989 Other specified soft tissue disorders: Secondary | ICD-10-CM | POA: Diagnosis not present

## 2021-10-17 DIAGNOSIS — R0602 Shortness of breath: Secondary | ICD-10-CM | POA: Diagnosis not present

## 2021-10-18 DIAGNOSIS — E118 Type 2 diabetes mellitus with unspecified complications: Secondary | ICD-10-CM | POA: Diagnosis not present

## 2021-10-18 DIAGNOSIS — I5022 Chronic systolic (congestive) heart failure: Secondary | ICD-10-CM | POA: Diagnosis not present

## 2021-10-18 DIAGNOSIS — I255 Ischemic cardiomyopathy: Secondary | ICD-10-CM | POA: Diagnosis not present

## 2021-10-18 DIAGNOSIS — E782 Mixed hyperlipidemia: Secondary | ICD-10-CM | POA: Diagnosis not present

## 2021-10-18 DIAGNOSIS — I2510 Atherosclerotic heart disease of native coronary artery without angina pectoris: Secondary | ICD-10-CM | POA: Diagnosis not present

## 2021-10-18 DIAGNOSIS — I714 Abdominal aortic aneurysm, without rupture, unspecified: Secondary | ICD-10-CM | POA: Diagnosis not present

## 2021-10-18 DIAGNOSIS — I10 Essential (primary) hypertension: Secondary | ICD-10-CM | POA: Diagnosis not present

## 2021-11-08 DIAGNOSIS — G20 Parkinson's disease: Secondary | ICD-10-CM | POA: Diagnosis not present

## 2021-11-08 DIAGNOSIS — I9589 Other hypotension: Secondary | ICD-10-CM | POA: Diagnosis not present

## 2021-11-08 DIAGNOSIS — I2510 Atherosclerotic heart disease of native coronary artery without angina pectoris: Secondary | ICD-10-CM | POA: Diagnosis not present

## 2021-11-08 DIAGNOSIS — I10 Essential (primary) hypertension: Secondary | ICD-10-CM | POA: Diagnosis not present

## 2021-11-08 DIAGNOSIS — E785 Hyperlipidemia, unspecified: Secondary | ICD-10-CM | POA: Diagnosis not present

## 2021-11-08 DIAGNOSIS — D519 Vitamin B12 deficiency anemia, unspecified: Secondary | ICD-10-CM | POA: Diagnosis not present

## 2021-11-08 DIAGNOSIS — E1140 Type 2 diabetes mellitus with diabetic neuropathy, unspecified: Secondary | ICD-10-CM | POA: Diagnosis not present

## 2021-12-01 DIAGNOSIS — D692 Other nonthrombocytopenic purpura: Secondary | ICD-10-CM | POA: Diagnosis not present

## 2021-12-01 DIAGNOSIS — E782 Mixed hyperlipidemia: Secondary | ICD-10-CM | POA: Diagnosis not present

## 2021-12-01 DIAGNOSIS — E118 Type 2 diabetes mellitus with unspecified complications: Secondary | ICD-10-CM | POA: Diagnosis not present

## 2021-12-01 DIAGNOSIS — I714 Abdominal aortic aneurysm, without rupture, unspecified: Secondary | ICD-10-CM | POA: Diagnosis not present

## 2021-12-01 DIAGNOSIS — R7989 Other specified abnormal findings of blood chemistry: Secondary | ICD-10-CM | POA: Diagnosis not present

## 2021-12-08 DIAGNOSIS — I2510 Atherosclerotic heart disease of native coronary artery without angina pectoris: Secondary | ICD-10-CM | POA: Diagnosis not present

## 2021-12-08 DIAGNOSIS — G20 Parkinson's disease: Secondary | ICD-10-CM | POA: Diagnosis not present

## 2021-12-08 DIAGNOSIS — I5022 Chronic systolic (congestive) heart failure: Secondary | ICD-10-CM | POA: Diagnosis not present

## 2021-12-08 DIAGNOSIS — E1151 Type 2 diabetes mellitus with diabetic peripheral angiopathy without gangrene: Secondary | ICD-10-CM | POA: Diagnosis not present

## 2021-12-08 DIAGNOSIS — E1169 Type 2 diabetes mellitus with other specified complication: Secondary | ICD-10-CM | POA: Diagnosis not present

## 2021-12-08 DIAGNOSIS — E1142 Type 2 diabetes mellitus with diabetic polyneuropathy: Secondary | ICD-10-CM | POA: Diagnosis not present

## 2021-12-08 DIAGNOSIS — Z0000 Encounter for general adult medical examination without abnormal findings: Secondary | ICD-10-CM | POA: Diagnosis not present

## 2021-12-08 DIAGNOSIS — D519 Vitamin B12 deficiency anemia, unspecified: Secondary | ICD-10-CM | POA: Diagnosis not present

## 2021-12-08 DIAGNOSIS — Z1389 Encounter for screening for other disorder: Secondary | ICD-10-CM | POA: Diagnosis not present

## 2021-12-08 DIAGNOSIS — I110 Hypertensive heart disease with heart failure: Secondary | ICD-10-CM | POA: Diagnosis not present

## 2021-12-12 DIAGNOSIS — E782 Mixed hyperlipidemia: Secondary | ICD-10-CM | POA: Diagnosis not present

## 2021-12-12 DIAGNOSIS — I714 Abdominal aortic aneurysm, without rupture, unspecified: Secondary | ICD-10-CM | POA: Diagnosis not present

## 2021-12-12 DIAGNOSIS — I255 Ischemic cardiomyopathy: Secondary | ICD-10-CM | POA: Diagnosis not present

## 2021-12-12 DIAGNOSIS — E118 Type 2 diabetes mellitus with unspecified complications: Secondary | ICD-10-CM | POA: Diagnosis not present

## 2021-12-12 DIAGNOSIS — I2510 Atherosclerotic heart disease of native coronary artery without angina pectoris: Secondary | ICD-10-CM | POA: Diagnosis not present

## 2021-12-12 DIAGNOSIS — I5022 Chronic systolic (congestive) heart failure: Secondary | ICD-10-CM | POA: Diagnosis not present

## 2021-12-12 DIAGNOSIS — I10 Essential (primary) hypertension: Secondary | ICD-10-CM | POA: Diagnosis not present

## 2021-12-21 DIAGNOSIS — D649 Anemia, unspecified: Secondary | ICD-10-CM | POA: Diagnosis not present

## 2022-03-17 DIAGNOSIS — E119 Type 2 diabetes mellitus without complications: Secondary | ICD-10-CM | POA: Diagnosis not present

## 2022-03-17 DIAGNOSIS — D3131 Benign neoplasm of right choroid: Secondary | ICD-10-CM | POA: Diagnosis not present

## 2022-03-17 DIAGNOSIS — Z0100 Encounter for examination of eyes and vision without abnormal findings: Secondary | ICD-10-CM | POA: Diagnosis not present

## 2022-04-05 DIAGNOSIS — G20 Parkinson's disease: Secondary | ICD-10-CM | POA: Diagnosis not present

## 2022-04-05 DIAGNOSIS — R4189 Other symptoms and signs involving cognitive functions and awareness: Secondary | ICD-10-CM | POA: Diagnosis not present

## 2022-04-05 DIAGNOSIS — J449 Chronic obstructive pulmonary disease, unspecified: Secondary | ICD-10-CM | POA: Diagnosis not present

## 2022-04-05 DIAGNOSIS — G4752 REM sleep behavior disorder: Secondary | ICD-10-CM | POA: Diagnosis not present

## 2022-04-10 DIAGNOSIS — D2262 Melanocytic nevi of left upper limb, including shoulder: Secondary | ICD-10-CM | POA: Diagnosis not present

## 2022-04-10 DIAGNOSIS — D2261 Melanocytic nevi of right upper limb, including shoulder: Secondary | ICD-10-CM | POA: Diagnosis not present

## 2022-04-10 DIAGNOSIS — X32XXXA Exposure to sunlight, initial encounter: Secondary | ICD-10-CM | POA: Diagnosis not present

## 2022-04-10 DIAGNOSIS — Z85828 Personal history of other malignant neoplasm of skin: Secondary | ICD-10-CM | POA: Diagnosis not present

## 2022-04-10 DIAGNOSIS — L570 Actinic keratosis: Secondary | ICD-10-CM | POA: Diagnosis not present

## 2022-04-10 DIAGNOSIS — D225 Melanocytic nevi of trunk: Secondary | ICD-10-CM | POA: Diagnosis not present

## 2022-05-24 ENCOUNTER — Other Ambulatory Visit (INDEPENDENT_AMBULATORY_CARE_PROVIDER_SITE_OTHER): Payer: Self-pay | Admitting: Vascular Surgery

## 2022-05-24 DIAGNOSIS — I714 Abdominal aortic aneurysm, without rupture, unspecified: Secondary | ICD-10-CM

## 2022-05-26 NOTE — Progress Notes (Signed)
MRN : [id number]  Nicholas Parker is a 88 y.o. (1933-12-18) male who presents with chief complaint of check circulation.  History of Present Illness:   The patient returns to the office for surveillance of an abdominal aortic aneurysm status post stent graft placement on 04/22/2014.    Patient denies abdominal pain or back pain, no other abdominal complaints. No groin related complaints. No symptoms consistent with distal embolization No changes in claudication distance.    There have been no interval changes in his overall healthcare since his last visit.    Patient denies amaurosis fugax or TIA symptoms. There is no history of claudication or rest pain symptoms of the lower extremities. The patient denies angina or shortness of breath.    Duplex US of the aorta and iliac arteries shows a 3.98 AAA sac with no endoleak, slight increase in the sac compared to the previous study (3.98 cm by previous scan).    No outpatient medications have been marked as taking for the 05/29/22 encounter (Appointment) with Delana Meyer, Dolores Lory, MD.    Past Medical History:  Diagnosis Date   AAA (abdominal aortic aneurysm) ([place])    Adenocarcinoma ([place])    Anemia    B12 deficiency    Cardiomyopathy, ischemic 10/18/2015   Coronary artery disease    Diabetes mellitus without complication (HCC)    Diverticulosis    Dyspnea    GERD (gastroesophageal reflux disease)    Hyperlipidemia    Hypertension    Low testosterone    Myocardial infarction ([place]) 10/12/2015   neuropathy    Neuropathy    Skin cancer    Basal Cell   Ventricular tachycardia, sustained ([place]) 10/18/2015    Past Surgical History:  Procedure Laterality Date   ABDOMINAL AORTIC ANEURYSM REPAIR  2014   Dr. Delana Meyer, [facility]   arthrosopic rotator cuff repair Left    CARDIAC CATHETERIZATION N/A 09/17/2015   Procedure: Left Heart Cath and Coronary Angiography;  Surgeon: Wellington Hampshire, MD;  Location: [facility];  Service:  Cardiovascular;  Laterality: N/A;   CARDIAC CATHETERIZATION N/A 09/17/2015   Procedure: Coronary Stent Intervention;  Surgeon: Wellington Hampshire, MD;  Location: [place] [facility];  Service: Cardiovascular;  Laterality: N/A;   COLON SURGERY  05/2006   COLONOSCOPY WITH PROPOFOL N/A 04/13/2015   Procedure: COLONOSCOPY WITH PROPOFOL;  Surgeon: Lollie Sails, MD;  Location: [facility] ENDOSCOPY;  Service: Endoscopy;  Laterality: N/A;   COLONOSCOPY WITH PROPOFOL N/A 04/14/2015   Procedure: COLONOSCOPY WITH PROPOFOL;  Surgeon: Lollie Sails, MD;  Location: [facility] END[facility]COPY;  Service: Endoscopy;  Laterality: N/A;   COLONOSCOPY WITH PROPOFOL N/A 07/20/2015   Procedure: COLONOSCOPY WITH PROPOFOL;  Surgeon: Lollie Sails, MD;  Location: [facility] ENDOSCOPY;  Service: Endoscopy;  Laterality: N/A;   CORONARY ANGIOPLASTY     ESOPHAGOGASTRODUODENOSCOPY N/A 08/29/2016   Procedure: ESOPHAGOGASTRODUODENOSCOPY (EGD) with removal of foreign body;  Surgeon: Lollie Sails, MD;  Location: [facility] ENDOSCOPY;  Service: Endoscopy;  Laterality: N/A;   ESOPHAGOGASTRODUODENOSCOPY N/A 05/11/2021   Procedure: ESOPHAGOGASTRODUODENOSCOPY (EGD);  Surgeon: [place], Benay Pike, MD;  Location: [facility] ENDOSCOPY;  Service: Gastroenterology;  Laterality: N/A;   ESOPHAGOGASTRODUODENOSCOPY (EGD) WITH PROPOFOL N/A 09/29/2016   Procedure: ESOPHAGOGASTRODUODENOSCOPY (EGD) WITH PROPOFOL;  Surgeon: Lollie Sails, MD;  Location: [facility] ENDOSCOPY;  Service: Endoscopy;  Laterality: N/A;   EYE SURGERY Bilateral    Cataract Extraction with IOL   LAPAROSCOPIC RIGHT COLECTOMY Right 09/14/2015   Procedure: LAPAROSCOPIC RIGHT COLECTOMY  CONVERTED  TO OPEN RIGHT COLECTOMY, LYSIS OF ADHESIONS;  Surgeon: Leonie Green, MD;  Location: [facility] ORS;  Service: General;  Laterality: Right;   robotic colectomy     VASCULAR SURGERY      Social History Social History   Tobacco Use   Smoking status: Former    Packs/day: 1.50    Types: Cigarettes    Quit  date: 10/03/2003    Years since quitting: 18.6   Smokeless tobacco: Never  Substance Use Topics   Alcohol use: Yes    Alcohol/week: 1.0 standard drink of alcohol    Types: 1 Cans of beer per week   Drug use: No    Family History Family History  Problem Relation Age of Onset   CAD Mother    Diabetes type II Mother    Heart attack Mother    Lung cancer Father    Glaucoma Other     Allergies  Allergen Reactions   Lipitor [Atorvastatin] Other (See Comments)    Reaction:  Leg cramps    Pravachol [Pravastatin Sodium] Other (See Comments)    Reaction:  Leg cramps   Riomet [Metformin Hcl] Diarrhea   Vytorin [Ezetimibe-Simvastatin] Other (See Comments)    Reaction:  Leg cramps     REVIEW OF SYSTEMS (Negative unless checked)  Constitutional: [] Weight loss  [] Fever  [] Chills Cardiac: [] Chest pain   [] Chest pressure   [] Palpitations   [] Shortness of breath when laying flat   [] Shortness of breath with exertion. Vascular:  [x] Pain in legs with walking   [] Pain in legs at rest  [] History of DVT   [] Phlebitis   [] Swelling in legs   [] Varicose veins   [] Non-healing ulcers Pulmonary:   [] Uses home oxygen   [] Productive cough   [] Hemoptysis   [] Wheeze  [] COPD   [] Asthma Neurologic:  [] Dizziness   [] Seizures   [] History of stroke   [] History of TIA  [] Aphasia   [] Vissual changes   [] Weakness or numbness in arm   [] Weakness or numbness in leg Musculoskeletal:   [] Joint swelling   [] Joint pain   [] Low back pain Hematologic:  [] Easy bruising  [] Easy bleeding   [] Hypercoagulable state   [] Anemic Gastrointestinal:  [] Diarrhea   [] Vomiting  [] Gastroesophageal reflux/heartburn   [] Difficulty swallowing. Genitourinary:  [] Chronic kidney disease   [] Difficult urination  [] Frequent urination   [] Blood in urine Skin:  [] Rashes   [] Ulcers  Psychological:  [] History of anxiety   []  History of major depression.  Physical Examination  There were no vitals filed for this visit. There is no height or  weight on file to calculate BMI. Gen: WD/WN, NAD Head: [place]/AT, No temporalis wasting.  Ear/Nose/Throat: Hearing grossly intact, nares w/o erythema or drainage Eyes: PER, EOMI, sclera nonicteric.  Neck: Supple, no masses.  No bruit or JVD.  Pulmonary:  Good air movement, no audible wheezing, no use of accessory muscles.  Cardiac: RRR, normal S1, S2, no Murmurs. Vascular:  mild trophic changes, no open wounds Vessel Right Left  Radial Palpable Palpable  PT Not Palpable Not Palpable  DP Not Palpable Not Palpable  Gastrointestinal: soft, non-distended. No guarding/no peritoneal signs.  Musculoskeletal: M/S 5/5 throughout.  No visible deformity.  Neurologic: CN 2-12 intact. Pain and light touch intact in extremities.  Symmetrical.  Speech is fluent. Motor exam as listed above. Psychiatric: Judgment intact, Mood & affect appropriate for pt's clinical situation. Dermatologic: No rashes or ulcers noted.  No changes consistent with cellulitis.   CBC Lab Results  Component Value  Date   WBC 6.9 05/11/2021   HGB 14.0 05/11/2021   HCT 41.9 05/11/2021   MCV 88.2 05/11/2021   PLT 288 05/11/2021    BMET    Component Value Date/Time   NA 139 05/11/2021 1140   NA 141 04/23/2014 0413   K 3.8 05/11/2021 1140   K 4.6 04/23/2014 0413   CL 97 (L) 05/11/2021 1140   CL 108 (H) 04/23/2014 0413   CO2 27 05/11/2021 1140   CO2 21 04/23/2014 0413   GLUCOSE 68 (L) 05/11/2021 1140   GLUCOSE 150 (H) 04/23/2014 0413   BUN 16 05/11/2021 1140   BUN 14 04/23/2014 0413   CREATININE 0.89 05/11/2021 1140   CREATININE 1.09 04/23/2014 0413   CALCIUM 9.1 05/11/2021 1140   CALCIUM 8.3 (L) 04/23/2014 0413   GFRNONAA >60 05/11/2021 1140   GFRNONAA >60 04/23/2014 0413   GFRAA 58 (L) 08/29/2016 1855   GFRAA >60 04/23/2014 0413   CrCl cannot be calculated (Patient's most recent lab result is older than the maximum 21 days allowed.).  COAG Lab Results  Component Value Date   INR 1.11 08/29/2016   INR 1.28  09/29/2015   INR 1.1 04/23/2014    Radiology No results found.   Assessment/Plan 1. Infrarenal abdominal aortic aneurysm (AAA) without rupture (HCC) Recommend:  Patient is status post successful endovascular repair of the AAA.   No further intervention is required at this time.   No endoleak is detected but the aneurysm sac has increased insize by 4.5 mm.  Therefore will rescan at 6 months not one year.  The patient will continue antiplatelet therapy as prescribed as well as aggressive management of hyperlipidemia. Exercise is again strongly encouraged.   However, endografts require continued surveillance with ultrasound or CT scan. This is mandatory to detect any changes that allow repressurization of the aneurysm sac.  The patient is informed that this would be asymptomatic.  The patient is reminded that lifelong routine surveillance is a necessity with an endograft. Patient will continue to follow-up at the specified interval with ultrasound of the aorta.  - VAS US AORTA/IVC/ILIACS; Future  2. Coronary artery disease of native artery of native heart with stable angina pectoris (HCC) Continue cardiac and antihypertensive medications as already ordered and reviewed, no changes at this time.  Continue statin as ordered and reviewed, no changes at this time  Nitrates PRN for chest pain   3. Benign essential hypertension Continue antihypertensive medications as already ordered, these medications have been reviewed and there are no changes at this time.   4. Type 2 diabetes mellitus with diabetic peripheral angiopathy without gangrene, without long-term current use of insulin ([place]) Continue hypoglycemic medications as already ordered, these medications have been reviewed and there are no changes at this time.  Hgb A1C to be monitored as already arranged by primary service   5. Mixed hyperlipidemia Continue statin as ordered and reviewed, no changes at this time   6. Abdominal  aortic aneurysm (AAA) without rupture, unspecified part ([place]) See #1 - VAS [place] EV[place], MD  05/26/2022 5:50 PM

## 2022-05-29 ENCOUNTER — Encounter (INDEPENDENT_AMBULATORY_CARE_PROVIDER_SITE_OTHER): Payer: Self-pay | Admitting: Vascular Surgery

## 2022-05-29 ENCOUNTER — Ambulatory Visit (INDEPENDENT_AMBULATORY_CARE_PROVIDER_SITE_OTHER): Payer: Medicare HMO | Admitting: Vascular Surgery

## 2022-05-29 ENCOUNTER — Ambulatory Visit (INDEPENDENT_AMBULATORY_CARE_PROVIDER_SITE_OTHER): Payer: Medicare HMO

## 2022-05-29 VITALS — BP 124/65 | HR 76 | Resp 16 | Wt 140.0 lb

## 2022-05-29 DIAGNOSIS — E782 Mixed hyperlipidemia: Secondary | ICD-10-CM | POA: Diagnosis not present

## 2022-05-29 DIAGNOSIS — I25118 Atherosclerotic heart disease of native coronary artery with other forms of angina pectoris: Secondary | ICD-10-CM | POA: Diagnosis not present

## 2022-05-29 DIAGNOSIS — I714 Abdominal aortic aneurysm, without rupture, unspecified: Secondary | ICD-10-CM

## 2022-05-29 DIAGNOSIS — E1151 Type 2 diabetes mellitus with diabetic peripheral angiopathy without gangrene: Secondary | ICD-10-CM

## 2022-05-29 DIAGNOSIS — I10 Essential (primary) hypertension: Secondary | ICD-10-CM | POA: Diagnosis not present

## 2022-05-29 DIAGNOSIS — I7143 Infrarenal abdominal aortic aneurysm, without rupture: Secondary | ICD-10-CM

## 2022-06-01 DIAGNOSIS — E782 Mixed hyperlipidemia: Secondary | ICD-10-CM | POA: Diagnosis not present

## 2022-06-01 DIAGNOSIS — D518 Other vitamin B12 deficiency anemias: Secondary | ICD-10-CM | POA: Diagnosis not present

## 2022-06-01 DIAGNOSIS — E118 Type 2 diabetes mellitus with unspecified complications: Secondary | ICD-10-CM | POA: Diagnosis not present

## 2022-06-04 ENCOUNTER — Encounter (INDEPENDENT_AMBULATORY_CARE_PROVIDER_SITE_OTHER): Payer: Self-pay | Admitting: Vascular Surgery

## 2022-06-08 DIAGNOSIS — R0609 Other forms of dyspnea: Secondary | ICD-10-CM | POA: Diagnosis not present

## 2022-06-08 DIAGNOSIS — J90 Pleural effusion, not elsewhere classified: Secondary | ICD-10-CM | POA: Diagnosis not present

## 2022-06-08 DIAGNOSIS — I714 Abdominal aortic aneurysm, without rupture, unspecified: Secondary | ICD-10-CM | POA: Diagnosis not present

## 2022-06-08 DIAGNOSIS — D518 Other vitamin B12 deficiency anemias: Secondary | ICD-10-CM | POA: Diagnosis not present

## 2022-06-08 DIAGNOSIS — E1142 Type 2 diabetes mellitus with diabetic polyneuropathy: Secondary | ICD-10-CM | POA: Diagnosis not present

## 2022-06-08 DIAGNOSIS — I110 Hypertensive heart disease with heart failure: Secondary | ICD-10-CM | POA: Diagnosis not present

## 2022-06-08 DIAGNOSIS — I2510 Atherosclerotic heart disease of native coronary artery without angina pectoris: Secondary | ICD-10-CM | POA: Diagnosis not present

## 2022-06-08 DIAGNOSIS — D649 Anemia, unspecified: Secondary | ICD-10-CM | POA: Diagnosis not present

## 2022-06-08 DIAGNOSIS — G20C Parkinsonism, unspecified: Secondary | ICD-10-CM | POA: Diagnosis not present

## 2022-06-08 DIAGNOSIS — I509 Heart failure, unspecified: Secondary | ICD-10-CM | POA: Diagnosis not present

## 2022-06-08 DIAGNOSIS — R0600 Dyspnea, unspecified: Secondary | ICD-10-CM | POA: Diagnosis not present

## 2022-06-12 DIAGNOSIS — I2510 Atherosclerotic heart disease of native coronary artery without angina pectoris: Secondary | ICD-10-CM | POA: Diagnosis not present

## 2022-06-12 DIAGNOSIS — I10 Essential (primary) hypertension: Secondary | ICD-10-CM | POA: Diagnosis not present

## 2022-06-12 DIAGNOSIS — E118 Type 2 diabetes mellitus with unspecified complications: Secondary | ICD-10-CM | POA: Diagnosis not present

## 2022-06-12 DIAGNOSIS — I255 Ischemic cardiomyopathy: Secondary | ICD-10-CM | POA: Diagnosis not present

## 2022-06-12 DIAGNOSIS — I714 Abdominal aortic aneurysm, without rupture, unspecified: Secondary | ICD-10-CM | POA: Diagnosis not present

## 2022-06-12 DIAGNOSIS — E782 Mixed hyperlipidemia: Secondary | ICD-10-CM | POA: Diagnosis not present

## 2022-06-12 DIAGNOSIS — I5022 Chronic systolic (congestive) heart failure: Secondary | ICD-10-CM | POA: Diagnosis not present

## 2022-06-12 DIAGNOSIS — Z955 Presence of coronary angioplasty implant and graft: Secondary | ICD-10-CM | POA: Diagnosis not present

## 2022-06-19 ENCOUNTER — Other Ambulatory Visit (INDEPENDENT_AMBULATORY_CARE_PROVIDER_SITE_OTHER): Payer: Medicare HMO

## 2022-06-19 ENCOUNTER — Ambulatory Visit (INDEPENDENT_AMBULATORY_CARE_PROVIDER_SITE_OTHER): Payer: Medicare HMO | Admitting: Vascular Surgery

## 2022-06-19 DIAGNOSIS — R918 Other nonspecific abnormal finding of lung field: Secondary | ICD-10-CM | POA: Diagnosis not present

## 2022-06-19 DIAGNOSIS — R0602 Shortness of breath: Secondary | ICD-10-CM | POA: Diagnosis not present

## 2022-06-19 DIAGNOSIS — J90 Pleural effusion, not elsewhere classified: Secondary | ICD-10-CM | POA: Diagnosis not present

## 2022-06-19 DIAGNOSIS — J449 Chronic obstructive pulmonary disease, unspecified: Secondary | ICD-10-CM | POA: Diagnosis not present

## 2022-06-20 ENCOUNTER — Other Ambulatory Visit: Payer: Self-pay | Admitting: Specialist

## 2022-06-20 DIAGNOSIS — R918 Other nonspecific abnormal finding of lung field: Secondary | ICD-10-CM

## 2022-06-20 DIAGNOSIS — R0602 Shortness of breath: Secondary | ICD-10-CM

## 2022-06-26 ENCOUNTER — Ambulatory Visit
Admission: RE | Admit: 2022-06-26 | Discharge: 2022-06-26 | Disposition: A | Discharge status: Home / Self Care | Payer: Medicare HMO | Source: Ambulatory Visit | Attending: Specialist | Admitting: Specialist

## 2022-06-26 DIAGNOSIS — J439 Emphysema, unspecified: Secondary | ICD-10-CM | POA: Diagnosis not present

## 2022-06-26 DIAGNOSIS — R918 Other nonspecific abnormal finding of lung field: Secondary | ICD-10-CM

## 2022-06-26 DIAGNOSIS — J90 Pleural effusion, not elsewhere classified: Secondary | ICD-10-CM | POA: Diagnosis not present

## 2022-06-26 DIAGNOSIS — R590 Localized enlarged lymph nodes: Secondary | ICD-10-CM | POA: Diagnosis not present

## 2022-06-26 DIAGNOSIS — R0602 Shortness of breath: Secondary | ICD-10-CM

## 2022-06-26 DIAGNOSIS — R911 Solitary pulmonary nodule: Secondary | ICD-10-CM | POA: Diagnosis not present

## 2022-07-03 ENCOUNTER — Other Ambulatory Visit: Payer: Self-pay | Admitting: Specialist

## 2022-07-03 ENCOUNTER — Encounter: Payer: Self-pay | Admitting: Specialist

## 2022-07-03 DIAGNOSIS — R911 Solitary pulmonary nodule: Secondary | ICD-10-CM

## 2022-07-03 DIAGNOSIS — R918 Other nonspecific abnormal finding of lung field: Secondary | ICD-10-CM

## 2022-07-04 ENCOUNTER — Ambulatory Visit
Admission: RE | Admit: 2022-07-04 | Discharge: 2022-07-04 | Disposition: A | Discharge status: Home / Self Care | Payer: Medicare HMO | Source: Ambulatory Visit | Attending: Specialist | Admitting: Specialist

## 2022-07-04 ENCOUNTER — Other Ambulatory Visit: Payer: Self-pay | Admitting: Specialist

## 2022-07-04 DIAGNOSIS — R911 Solitary pulmonary nodule: Secondary | ICD-10-CM | POA: Insufficient documentation

## 2022-07-04 DIAGNOSIS — J90 Pleural effusion, not elsewhere classified: Secondary | ICD-10-CM | POA: Insufficient documentation

## 2022-07-04 DIAGNOSIS — K8020 Calculus of gallbladder without cholecystitis without obstruction: Secondary | ICD-10-CM | POA: Diagnosis not present

## 2022-07-04 DIAGNOSIS — R0602 Shortness of breath: Secondary | ICD-10-CM | POA: Diagnosis not present

## 2022-07-04 DIAGNOSIS — R918 Other nonspecific abnormal finding of lung field: Secondary | ICD-10-CM | POA: Insufficient documentation

## 2022-07-04 DIAGNOSIS — J439 Emphysema, unspecified: Secondary | ICD-10-CM | POA: Insufficient documentation

## 2022-07-04 DIAGNOSIS — R600 Localized edema: Secondary | ICD-10-CM | POA: Insufficient documentation

## 2022-07-04 DIAGNOSIS — J449 Chronic obstructive pulmonary disease, unspecified: Secondary | ICD-10-CM | POA: Diagnosis not present

## 2022-07-04 LAB — GLUCOSE, CAPILLARY: Glucose-Capillary: 77 mg/dL (ref 70–99)

## 2022-07-04 MED ORDER — FLUDEOXYGLUCOSE F - 18 (FDG) INJECTION
7.3000 | Freq: Once | INTRAVENOUS | Status: AC | PRN
  Administered 2022-07-04: 7.95 via INTRAVENOUS

## 2022-07-05 ENCOUNTER — Ambulatory Visit
Admission: RE | Admit: 2022-07-05 | Discharge: 2022-07-05 | Disposition: A | Discharge status: Home / Self Care | Payer: Medicare HMO | Source: Ambulatory Visit | Attending: Student | Admitting: Student

## 2022-07-05 ENCOUNTER — Ambulatory Visit
Admission: RE | Admit: 2022-07-05 | Discharge: 2022-07-05 | Disposition: A | Discharge status: Home / Self Care | Payer: Medicare HMO | Source: Ambulatory Visit | Attending: Specialist | Admitting: Specialist

## 2022-07-05 ENCOUNTER — Other Ambulatory Visit: Payer: Self-pay | Admitting: Student

## 2022-07-05 VITALS — BP 117/55 | HR 71

## 2022-07-05 DIAGNOSIS — E785 Hyperlipidemia, unspecified: Secondary | ICD-10-CM | POA: Insufficient documentation

## 2022-07-05 DIAGNOSIS — Z9889 Other specified postprocedural states: Secondary | ICD-10-CM | POA: Diagnosis not present

## 2022-07-05 DIAGNOSIS — J90 Pleural effusion, not elsewhere classified: Secondary | ICD-10-CM

## 2022-07-05 DIAGNOSIS — J939 Pneumothorax, unspecified: Secondary | ICD-10-CM | POA: Insufficient documentation

## 2022-07-05 LAB — BODY FLUID CELL COUNT WITH DIFFERENTIAL
Eos, Fluid: 0 %
Lymphs, Fluid: 85 %
Monocyte-Macrophage-Serous Fluid: 11 %
Neutrophil Count, Fluid: 4 %
Total Nucleated Cell Count, Fluid: 1961 cu mm

## 2022-07-05 LAB — AMYLASE, PLEURAL OR PERITONEAL FLUID: Amylase, Fluid: 36 U/L

## 2022-07-05 LAB — LACTATE DEHYDROGENASE, PLEURAL OR PERITONEAL FLUID: LD, Fluid: 86 U/L — ABNORMAL HIGH (ref 3–23)

## 2022-07-05 LAB — GLUCOSE, PLEURAL OR PERITONEAL FLUID: Glucose, Fluid: 106 mg/dL

## 2022-07-05 LAB — PROTEIN, PLEURAL OR PERITONEAL FLUID: Total protein, fluid: 3.4 g/dL

## 2022-07-05 MED ORDER — LIDOCAINE HCL (PF) 1 % IJ SOLN
7.0000 mL | Freq: Once | INTRAMUSCULAR | Status: AC
  Administered 2022-07-05: 7 mL via INTRADERMAL

## 2022-07-05 NOTE — Procedures (Signed)
PROCEDURE SUMMARY:  Successful image-guided right thoracentesis. Yielded 800cc of yellow fluid. Pt tolerated procedure well. No immediate complications. EBL = trace   Specimen was sent for labs. CXR revealed small right pneumothorax ex vacuo at base of the right lung. Patient was asymptomatic following the procedure, and denied any symptoms following his CXR. Patient was given the option of remaining at the hospital for 1-hour follow-up CXR, but pt declined stating he preferred to go home. Strict return instructions were given to the patient and his wife to return to [facility] ED if shortness of breath, chest pain, or respiratory distress occurs. The patient and his wife both voiced their understanding.  Please see imaging section of Epic for full dictation.  Lura Em PA-C 07/05/2022 1:35 PM

## 2022-07-07 LAB — CHOLESTEROL, BODY FLUID: Cholesterol, Fluid: 38 mg/dL

## 2022-07-08 LAB — ACID FAST SMEAR (AFB, MYCOBACTERIA): Acid Fast Smear: NEGATIVE

## 2022-07-09 LAB — BODY FLUID CULTURE W GRAM STAIN: Culture: NO GROWTH

## 2022-07-10 LAB — CYTOLOGY - NON PAP

## 2022-07-23 ENCOUNTER — Inpatient Hospital Stay
Admission: EM | Admit: 2022-07-23 | Discharge: 2022-07-25 | DRG: 180 | Disposition: A | Discharge status: Hospice | Payer: Medicare HMO | Attending: Internal Medicine | Admitting: Internal Medicine

## 2022-07-23 ENCOUNTER — Emergency Department: Payer: Medicare HMO

## 2022-07-23 ENCOUNTER — Other Ambulatory Visit: Payer: Self-pay

## 2022-07-23 DIAGNOSIS — R069 Unspecified abnormalities of breathing: Secondary | ICD-10-CM | POA: Diagnosis not present

## 2022-07-23 DIAGNOSIS — I700 Atherosclerosis of aorta: Secondary | ICD-10-CM | POA: Diagnosis present

## 2022-07-23 DIAGNOSIS — Z9049 Acquired absence of other specified parts of digestive tract: Secondary | ICD-10-CM

## 2022-07-23 DIAGNOSIS — I5022 Chronic systolic (congestive) heart failure: Secondary | ICD-10-CM | POA: Diagnosis present

## 2022-07-23 DIAGNOSIS — Z79899 Other long term (current) drug therapy: Secondary | ICD-10-CM

## 2022-07-23 DIAGNOSIS — C3491 Malignant neoplasm of unspecified part of right bronchus or lung: Secondary | ICD-10-CM

## 2022-07-23 DIAGNOSIS — Z888 Allergy status to other drugs, medicaments and biological substances status: Secondary | ICD-10-CM

## 2022-07-23 DIAGNOSIS — I959 Hypotension, unspecified: Secondary | ICD-10-CM | POA: Diagnosis not present

## 2022-07-23 DIAGNOSIS — I110 Hypertensive heart disease with heart failure: Secondary | ICD-10-CM | POA: Diagnosis present

## 2022-07-23 DIAGNOSIS — G20A1 Parkinson's disease without dyskinesia, without mention of fluctuations: Secondary | ICD-10-CM | POA: Diagnosis present

## 2022-07-23 DIAGNOSIS — J9601 Acute respiratory failure with hypoxia: Secondary | ICD-10-CM | POA: Diagnosis present

## 2022-07-23 DIAGNOSIS — Z1152 Encounter for screening for COVID-19: Secondary | ICD-10-CM

## 2022-07-23 DIAGNOSIS — E1140 Type 2 diabetes mellitus with diabetic neuropathy, unspecified: Secondary | ICD-10-CM | POA: Diagnosis present

## 2022-07-23 DIAGNOSIS — R918 Other nonspecific abnormal finding of lung field: Secondary | ICD-10-CM | POA: Diagnosis not present

## 2022-07-23 DIAGNOSIS — Z85038 Personal history of other malignant neoplasm of large intestine: Secondary | ICD-10-CM

## 2022-07-23 DIAGNOSIS — J439 Emphysema, unspecified: Secondary | ICD-10-CM | POA: Diagnosis present

## 2022-07-23 DIAGNOSIS — I255 Ischemic cardiomyopathy: Secondary | ICD-10-CM | POA: Diagnosis present

## 2022-07-23 DIAGNOSIS — Z85828 Personal history of other malignant neoplasm of skin: Secondary | ICD-10-CM

## 2022-07-23 DIAGNOSIS — L8990 Pressure ulcer of unspecified site, unspecified stage: Secondary | ICD-10-CM | POA: Insufficient documentation

## 2022-07-23 DIAGNOSIS — I2721 Secondary pulmonary arterial hypertension: Secondary | ICD-10-CM | POA: Diagnosis present

## 2022-07-23 DIAGNOSIS — C3401 Malignant neoplasm of right main bronchus: Principal | ICD-10-CM | POA: Diagnosis present

## 2022-07-23 DIAGNOSIS — J95811 Postprocedural pneumothorax: Secondary | ICD-10-CM | POA: Diagnosis not present

## 2022-07-23 DIAGNOSIS — I252 Old myocardial infarction: Secondary | ICD-10-CM | POA: Diagnosis not present

## 2022-07-23 DIAGNOSIS — Z87891 Personal history of nicotine dependence: Secondary | ICD-10-CM | POA: Diagnosis not present

## 2022-07-23 DIAGNOSIS — Z955 Presence of coronary angioplasty implant and graft: Secondary | ICD-10-CM

## 2022-07-23 DIAGNOSIS — R0902 Hypoxemia: Secondary | ICD-10-CM | POA: Diagnosis not present

## 2022-07-23 DIAGNOSIS — E538 Deficiency of other specified B group vitamins: Secondary | ICD-10-CM | POA: Diagnosis present

## 2022-07-23 DIAGNOSIS — Z7982 Long term (current) use of aspirin: Secondary | ICD-10-CM

## 2022-07-23 DIAGNOSIS — Z66 Do not resuscitate: Secondary | ICD-10-CM | POA: Diagnosis present

## 2022-07-23 DIAGNOSIS — L89322 Pressure ulcer of left buttock, stage 2: Secondary | ICD-10-CM | POA: Diagnosis present

## 2022-07-23 DIAGNOSIS — K219 Gastro-esophageal reflux disease without esophagitis: Secondary | ICD-10-CM | POA: Diagnosis present

## 2022-07-23 DIAGNOSIS — J910 Malignant pleural effusion: Secondary | ICD-10-CM

## 2022-07-23 DIAGNOSIS — E785 Hyperlipidemia, unspecified: Secondary | ICD-10-CM | POA: Diagnosis present

## 2022-07-23 DIAGNOSIS — J939 Pneumothorax, unspecified: Secondary | ICD-10-CM | POA: Diagnosis not present

## 2022-07-23 DIAGNOSIS — R590 Localized enlarged lymph nodes: Secondary | ICD-10-CM | POA: Diagnosis present

## 2022-07-23 DIAGNOSIS — Z9861 Coronary angioplasty status: Secondary | ICD-10-CM | POA: Diagnosis not present

## 2022-07-23 DIAGNOSIS — I2510 Atherosclerotic heart disease of native coronary artery without angina pectoris: Secondary | ICD-10-CM | POA: Diagnosis present

## 2022-07-23 DIAGNOSIS — R0602 Shortness of breath: Secondary | ICD-10-CM | POA: Diagnosis not present

## 2022-07-23 DIAGNOSIS — R0600 Dyspnea, unspecified: Secondary | ICD-10-CM | POA: Insufficient documentation

## 2022-07-23 DIAGNOSIS — Z95828 Presence of other vascular implants and grafts: Secondary | ICD-10-CM

## 2022-07-23 DIAGNOSIS — I9589 Other hypotension: Secondary | ICD-10-CM | POA: Diagnosis not present

## 2022-07-23 DIAGNOSIS — Z8249 Family history of ischemic heart disease and other diseases of the circulatory system: Secondary | ICD-10-CM

## 2022-07-23 DIAGNOSIS — J90 Pleural effusion, not elsewhere classified: Secondary | ICD-10-CM | POA: Diagnosis present

## 2022-07-23 DIAGNOSIS — Z833 Family history of diabetes mellitus: Secondary | ICD-10-CM

## 2022-07-23 DIAGNOSIS — Z8679 Personal history of other diseases of the circulatory system: Secondary | ICD-10-CM

## 2022-07-23 LAB — COMPREHENSIVE METABOLIC PANEL
ALT: <5 U/L (ref 0–44)
AST: 18 U/L (ref 15–41)
Albumin: 4.2 g/dL (ref 3.5–5.0)
Alkaline Phosphatase: 68 U/L (ref 38–126)
Anion gap: 10 (ref 5–15)
BUN: 20 mg/dL (ref 8–23)
CO2: 29 mmol/L (ref 22–32)
Calcium: 9.1 mg/dL (ref 8.9–10.3)
Chloride: 103 mmol/L (ref 98–111)
Creatinine, Ser: 0.92 mg/dL (ref 0.61–1.24)
GFR, Estimated: >60 mL/min (ref >60)
Glucose, Bld: 108 mg/dL — ABNORMAL HIGH (ref 70–99)
Potassium: 4.3 mmol/L (ref 3.5–5.1)
Sodium: 142 mmol/L (ref 135–145)
Total Bilirubin: 1.3 mg/dL — ABNORMAL HIGH (ref 0.3–1.2)
Total Protein: 7.4 g/dL (ref 6.5–8.1)

## 2022-07-23 LAB — CBC WITH DIFFERENTIAL/PLATELET
Abs Immature Granulocytes: 0.03 10*3/uL (ref 0.00–0.07)
Basophils Absolute: 0.2 10*3/uL — ABNORMAL HIGH (ref 0.0–0.1)
Basophils Relative: 2 %
Eosinophils Absolute: 0.3 10*3/uL (ref 0.0–0.5)
Eosinophils Relative: 3 %
HCT: 43.8 % (ref 39.0–52.0)
Hemoglobin: 14.2 g/dL (ref 13.0–17.0)
Immature Granulocytes: 0 %
Lymphocytes Relative: 6 %
Lymphs Abs: 0.6 10*3/uL — ABNORMAL LOW (ref 0.7–4.0)
MCH: 28.5 pg (ref 26.0–34.0)
MCHC: 32.4 g/dL (ref 30.0–36.0)
MCV: 87.8 fL (ref 80.0–100.0)
Monocytes Absolute: 0.6 10*3/uL (ref 0.1–1.0)
Monocytes Relative: 6 %
Neutro Abs: 8.3 10*3/uL — ABNORMAL HIGH (ref 1.7–7.7)
Neutrophils Relative %: 83 %
Platelets: 315 10*3/uL (ref 150–400)
RBC: 4.99 MIL/uL (ref 4.22–5.81)
RDW: 14.8 % (ref 11.5–15.5)
WBC: 10 10*3/uL (ref 4.0–10.5)
nRBC: 0 % (ref 0.0–0.2)

## 2022-07-23 LAB — RESP PANEL BY RT-PCR (FLU A&B, COVID) ARPGX2
Influenza A by PCR: NEGATIVE
Influenza B by PCR: NEGATIVE
SARS Coronavirus 2 by RT PCR: NEGATIVE

## 2022-07-23 LAB — BRAIN NATRIURETIC PEPTIDE: B Natriuretic Peptide: 456.2 pg/mL — ABNORMAL HIGH (ref 0.0–100.0)

## 2022-07-23 LAB — TROPONIN I (HIGH SENSITIVITY)
Troponin I (High Sensitivity): 12 ng/L (ref <18)
Troponin I (High Sensitivity): 15 ng/L (ref <18)

## 2022-07-23 MED ORDER — FLUDROCORTISONE ACETATE 0.1 MG PO TABS
0.1000 mg | ORAL_TABLET | Freq: Every day | ORAL | Status: DC
  Administered 2022-07-24 – 2022-07-25 (×2): 0.1 mg via ORAL
  Filled 2022-07-23 (×2): qty 1

## 2022-07-23 MED ORDER — LOVASTATIN 20 MG PO TABS: 20.0000 mg | ORAL_TABLET | Freq: Every day | ORAL | Status: DC

## 2022-07-23 MED ORDER — ONDANSETRON HCL 4 MG PO TABS: 4.0000 mg | ORAL_TABLET | Freq: Four times a day (QID) | ORAL | Status: DC | PRN

## 2022-07-23 MED ORDER — FUROSEMIDE 10 MG/ML IJ SOLN: 20.0000 mg | Freq: Once | INTRAMUSCULAR | Status: DC

## 2022-07-23 MED ORDER — CARBIDOPA-LEVODOPA 25-100 MG PO TABS
2.0000 | ORAL_TABLET | Freq: Four times a day (QID) | ORAL | Status: DC
  Administered 2022-07-23 – 2022-07-25 (×7): 2 via ORAL
  Filled 2022-07-23 (×8): qty 2

## 2022-07-23 MED ORDER — IOHEXOL 350 MG/ML SOLN
100.0000 mL | Freq: Once | INTRAVENOUS | Status: AC | PRN
  Administered 2022-07-23: 100 mL via INTRAVENOUS

## 2022-07-23 MED ORDER — PANTOPRAZOLE SODIUM 40 MG PO TBEC
40.0000 mg | DELAYED_RELEASE_TABLET | Freq: Every day | ORAL | Status: DC
  Administered 2022-07-24 – 2022-07-25 (×2): 40 mg via ORAL
  Filled 2022-07-23 (×2): qty 1

## 2022-07-23 MED ORDER — FUROSEMIDE 10 MG/ML IJ SOLN
20.0000 mg | Freq: Once | INTRAMUSCULAR | Status: AC
  Administered 2022-07-23: 20 mg via INTRAVENOUS
  Filled 2022-07-23: qty 2

## 2022-07-23 MED ORDER — ASPIRIN 81 MG PO TBEC
81.0000 mg | DELAYED_RELEASE_TABLET | Freq: Every day | ORAL | Status: DC
  Administered 2022-07-24 – 2022-07-25 (×2): 81 mg via ORAL
  Filled 2022-07-23 (×3): qty 1

## 2022-07-23 MED ORDER — ACETAMINOPHEN 650 MG RE SUPP: 650.0000 mg | Freq: Four times a day (QID) | RECTAL | Status: DC | PRN

## 2022-07-23 MED ORDER — ACETAMINOPHEN 325 MG PO TABS: 650.0000 mg | ORAL_TABLET | Freq: Four times a day (QID) | ORAL | Status: DC | PRN

## 2022-07-23 MED ORDER — ALBUTEROL SULFATE (2.5 MG/3ML) 0.083% IN NEBU: 2.5000 mg | INHALATION_SOLUTION | RESPIRATORY_TRACT | Status: DC | PRN

## 2022-07-23 MED ORDER — ONDANSETRON HCL 4 MG/2ML IJ SOLN: 4.0000 mg | Freq: Four times a day (QID) | INTRAMUSCULAR | Status: DC | PRN

## 2022-07-23 MED ORDER — PRAVASTATIN SODIUM 20 MG PO TABS
10.0000 mg | ORAL_TABLET | Freq: Every day | ORAL | Status: DC
  Administered 2022-07-24: 10 mg via ORAL
  Filled 2022-07-23: qty 1

## 2022-07-23 MED ORDER — ESCITALOPRAM OXALATE 10 MG PO TABS
10.0000 mg | ORAL_TABLET | Freq: Every day | ORAL | Status: DC
  Administered 2022-07-24 – 2022-07-25 (×2): 10 mg via ORAL
  Filled 2022-07-23 (×2): qty 1

## 2022-07-23 NOTE — Assessment & Plan Note (Signed)
Continue Sinemet ?

## 2022-07-23 NOTE — H&P (Incomplete Revision)
History and Physical    Patient: Nicholas Parker:[id number] DOB: 07/16/1934 DOA: 07/23/2022 DOS: the patient was seen and examined on 07/23/2022 PCP: Adin Hector, MD  Patient coming from: Home  Chief Complaint:  Chief Complaint  Patient presents with   Shortness of Breath    HPI: Nicholas Parker is a 88 y.o. male with medical history significant for AAA s/p endovascular repair 2015, colon cancer s/p partial colectomy 2007, CAD s/p PCI to RCA 2017, Parkinson's disease 09/3555, systolic CHF with EF 32% 2025, with recently diagnosed primary bronchogenic neoplasm on 11/7 by PET scan with pleural effusion s/p thoracentesis on 11/8, who presents to the ED with a 3-day history of worsening of his chronic shortness of breath, placed on O2 at 2 L by EMS for transport.  He denies chest pain, cough fever or chills and denies nausea, vomiting or diaphoresis. ED course and data review:.  Tachypneic to 24 with O2 sat 95% on 2 L with otherwise normal vitals.  Troponin 15, BNP 456 and COVID and flu negative.  CBC and CMP for the most part unremarkable.  EKG, personally viewed and interpreted shows sinus at 95 with PVCs and nonspecific ST-T wave changes.  CTA PE protocol shows a stable right lower lobe mass with associated right hilar and mediastinal adenopathy from recent PET scan with moderate to large right-sided pleural effusion increased from prior exam, further details as follows: IMPRESSION: Stable right lower lobe mass lesion with associated right hilar and mediastinal adenopathy stable from recent PET-CT.   Moderate to large right-sided pleural effusion which appears increased from previous exam.   No evidence of pulmonary emboli.   No other focal abnormality is noted   Aortic Atherosclerosis (ICD10-I70.0) and Emphysema (ICD10-J43.9).      Patient was given 40 mg IV Lasix by EMS.  He was given an additional 20 mg in the ED.  He was maintained on O2 as he rapidly desaturated to 84% when  O2 via nasal cannula was removed.  Hospitalist consulted for admission.   Review of Systems: As mentioned in the history of present illness. All other systems reviewed and are negative.  Past Medical History:  Diagnosis Date   AAA (abdominal aortic aneurysm) (HCC)    Adenocarcinoma (HCC)    Anemia    B12 deficiency    Cardiomyopathy, ischemic 10/18/2015   Coronary artery disease    Diabetes mellitus without complication (HCC)    Diverticulosis    Dyspnea    GERD (gastroesophageal reflux disease)    Hyperlipidemia    Hypertension    Low testosterone    Myocardial infarction ([place]) 10/12/2015   neuropathy    Neuropathy    Skin cancer    Basal Cell   Ventricular tachycardia, sustained ([place]) 10/18/2015   Past Surgical History:  Procedure Laterality Date   ABDOMINAL AORTIC ANEURYSM REPAIR  2014   Dr. Delana Meyer, [facility]   arthrosopic rotator cuff repair Left    CARDIAC CATHETERIZATION N/A 09/17/2015   Procedure: Left Heart Cath and Coronary Angiography;  Surgeon: Wellington Hampshire, MD;  Location: [place] [facility];  Service: Cardiovascular;  Laterality: N/A;   CARDIAC CATHETERIZATION N/A 09/17/2015   Procedure: Coronary Stent Intervention;  Surgeon: Wellington Hampshire, MD;  Location: [place] [facility];  Service: Cardiovascular;  Laterality: N/A;   COLON SURGERY  05/2006   COLONOSCOPY WITH PROPOFOL N/A 04/13/2015   Procedure: COLONOSCOPY WITH PROPOFOL;  Surgeon: Lollie Sails, MD;  Location: [facility] ENDOSCOPY;  Service: Endoscopy;  Laterality: N/A;   COLONOSCOPY WITH PROPOFOL N/A 04/14/2015   Procedure: COLONOSCOPY WITH PROPOFOL;  Surgeon: Lollie Sails, MD;  Location: [facility] ENDOSCOPY;  Service: Endoscopy;  Laterality: N/A;   COLONOSCOPY WITH PROPOFOL N/A 07/20/2015   Procedure: COLONOSCOPY WITH PROPOFOL;  Surgeon: Lollie Sails, MD;  Location: [facility] ENDOSCOPY;  Service: Endoscopy;  Laterality: N/A;   CORONARY ANGIOPLASTY     ESOPHAGOGASTRODUODENOSCOPY N/A 08/29/2016   Procedure:  ESOPHAGOGASTRODUODENOSCOPY (EGD) with removal of foreign body;  Surgeon: Lollie Sails, MD;  Location: [facility] ENDOSCOPY;  Service: Endoscopy;  Laterality: N/A;   ESOPHAGOGASTRODUODENOSCOPY N/A 05/11/2021   Procedure: ESOPHAGOGASTRODUODENOSCOPY (EGD);  Surgeon: Toledo, Benay Pike, MD;  Location: [facility] ENDOSCOPY;  Service: Gastroenterology;  Laterality: N/A;   ESOPHAGOGASTRODUODENOSCOPY (EGD) WITH PROPOFOL N/A 09/29/2016   Procedure: ESOPHAGOGASTRODUODENOSCOPY (EGD) WITH PROPOFOL;  Surgeon: Lollie Sails, MD;  Location: [facility] ENDOSCOPY;  Service: Endoscopy;  Laterality: N/A;   EYE SURGERY Bilateral    Cataract Extraction with IOL   LAPAROSCOPIC RIGHT COLECTOMY Right 09/14/2015   Procedure: LAPAROSCOPIC RIGHT COLECTOMY  CONVERTED TO OPEN RIGHT COLECTOMY, LYSIS OF ADHESIONS;  Surgeon: Leonie Green, MD;  Location: [facility] ORS;  Service: General;  Laterality: Right;   robotic colectomy     VASCULAR SURGERY     Social History:  reports that he quit smoking about 18 years ago. His smoking use included cigarettes. He smoked an average of 1.5 packs per day. He has never used smokeless tobacco. He reports current alcohol use of about 1.0 standard drink of alcohol per week. He reports that he does not use drugs.  Allergies  Allergen Reactions   Lipitor [Atorvastatin] Other (See Comments)    Reaction:  Leg cramps    Pravachol [Pravastatin Sodium] Other (See Comments)    Reaction:  Leg cramps   Riomet [Metformin Hcl] Diarrhea   Vytorin [Ezetimibe-Simvastatin] Other (See Comments)    Reaction:  Leg cramps    Family History  Problem Relation Age of Onset   CAD Mother    Diabetes type II Mother    Heart attack Mother    Lung cancer Father    Glaucoma Other     Prior to Admission medications   Medication Sig Start Date End Date Taking? Authorizing Provider  aspirin EC 81 MG tablet Take 81 mg by mouth daily.    [provider]  carbidopa-levodopa (SINEMET IR) 25-100 MG tablet Take 2  tablets by mouth 4 (four) times daily. 05/14/20   [provider]  Cholecalciferol 25 MCG (1000 UT) tablet Take by mouth.    [provider]  escitalopram (LEXAPRO) 10 MG tablet Take 10 mg by mouth daily. 04/15/21   [provider]  fludrocortisone (FLORINEF) 0.1 MG tablet Take 0.1 mg by mouth daily. 04/05/21   [provider]  lovastatin (MEVACOR) 40 MG tablet Take 40 mg by mouth at bedtime.    [provider]  midodrine (PROAMATINE) 5 MG tablet Take 5 mg by mouth 2 (two) times daily. 04/21/21   [provider]  montelukast (SINGULAIR) 10 MG tablet Take 10 mg by mouth daily. 03/23/21   [provider]  pantoprazole (PROTONIX) 40 MG tablet Take 40 mg by mouth daily.     [provider]  vitamin B-12 (CYANOCOBALAMIN) 500 MCG tablet Take 500 mcg by mouth daily.    [provider]    Physical Exam: Vitals:   07/23/22 1523 07/23/22 1524 07/23/22 1938 07/23/22 2145  BP: 123/78  138/70 Marland Kitchen)  150/69  Pulse: 93  78 72  Resp: (!) 24  (!) 22 (!) 32  Temp: 98 F (36.7 C)  98.2 F (36.8 C)   TempSrc: Oral  Oral   SpO2: 95%  95% 96%  Weight:  63.5 kg    Height:  5\' 7"  (1.702 m)     Physical Exam Vitals and nursing note reviewed.  Constitutional:      General: He is not in acute distress. HENT:     Head: Normocephalic and atraumatic.  Cardiovascular:     Rate and Rhythm: Normal rate and regular rhythm.     Heart sounds: Normal heart sounds.  Pulmonary:     Effort: Tachypnea present.     Breath sounds: Examination of the right-middle field reveals decreased breath sounds. Examination of the right-lower field reveals decreased breath sounds. Decreased breath sounds present.  Abdominal:     Palpations: Abdomen is soft.     Tenderness: There is no abdominal tenderness.  Neurological:     General: No focal deficit present.     Mental Status: He is oriented to person, place, and time. Mental status is at baseline.      Labs on Admission: I have personally reviewed following labs and imaging studies  CBC: Recent Labs  Lab 07/23/22 1457  WBC 10.0  NEUTROABS 8.3*  HGB 14.2  HCT 43.8  MCV 87.8  PLT 629   Basic Metabolic Panel: Recent Labs  Lab 07/23/22 1457  NA 142  K 4.3  CL 103  CO2 29  GLUCOSE 108*  BUN 20  CREATININE 0.92  CALCIUM 9.1   GFR: Estimated Creatinine Clearance: 51.8 mL/min (by C-G formula based on SCr of 0.92 mg/dL). Liver Function Tests: Recent Labs  Lab 07/23/22 1457  AST 18  ALT <5  ALKPHOS 68  BILITOT 1.3*  PROT 7.4  ALBUMIN 4.2   No results for input(s): "LIPASE", "AMYLASE" in the last 168 hours. No results for input(s): "AMMONIA" in the last 168 hours. Coagulation Profile: No results for input(s): "INR", "PROTIME" in the last 168 hours. Cardiac Enzymes: No results for input(s): "CKTOTAL", "CKMB", "CKMBINDEX", "TROPONINI" in the last 168 hours. BNP (last 3 results) No results for input(s): "PROBNP" in the last 8760 hours. HbA1C: No results for input(s): "HGBA1C" in the last 72 hours. CBG: No results for input(s): "GLUCAP" in the last 168 hours. Lipid Profile: No results for input(s): "CHOL", "HDL", "LDLCALC", "TRIG", "CHOLHDL", "LDLDIRECT" in the last 72 hours. Thyroid Function Tests: No results for input(s): "TSH", "T4TOTAL", "FREET4", "T3FREE", "THYROIDAB" in the last 72 hours. Anemia Panel: No results for input(s): "VITAMINB12", "FOLATE", "FERRITIN", "TIBC", "IRON", "RETICCTPCT" in the last 72 hours. Urine analysis:    Component Value Date/Time   COLORURINE YELLOW (A) 09/25/2015 0110   APPEARANCEUR CLOUDY (A) 09/25/2015 0110   APPEARANCEUR Clear 04/14/2014 0948   LABSPEC 1.019 09/25/2015 0110   LABSPEC 1.015 04/14/2014 0948   PHURINE 5.0 09/25/2015 0110   GLUCOSEU NEGATIVE 09/25/2015 0110   GLUCOSEU Negative 04/14/2014 0948   HGBUR 1+ (A) 09/25/2015 0110   BILIRUBINUR NEGATIVE 09/25/2015 0110   BILIRUBINUR Negative 04/14/2014 0948    KETONESUR TRACE (A) 09/25/2015 0110   PROTEINUR 30 (A) 09/25/2015 0110   NITRITE NEGATIVE 09/25/2015 0110   LEUKOCYTESUR NEGATIVE 09/25/2015 0110   LEUKOCYTESUR Trace 04/14/2014 0948    Radiological Exams on Admission: CT Angio Chest PE W/Cm &/Or Wo Cm  Result Date: 07/23/2022 CLINICAL DATA:  Shortness of breath for several days and history of recent  right-sided thoracentesis, initial encounter EXAM: CT ANGIOGRAPHY CHEST WITH CONTRAST TECHNIQUE: Multidetector CT imaging of the chest was performed using the standard protocol during bolus administration of intravenous contrast. Multiplanar CT image reconstructions and MIPs were obtained to evaluate the vascular anatomy. RADIATION DOSE REDUCTION: This exam was performed according to the departmental dose-optimization program which includes automated exposure control, adjustment of the mA and/or kV according to patient size and/or use of iterative reconstruction technique. CONTRAST:  137mL OMNIPAQUE IOHEXOL 350 MG/ML SOLN COMPARISON:  Chest x-ray from earlier in the same day, PET-CT from 07/04/2022 FINDINGS: Cardiovascular: Thoracic aorta demonstrates atherosclerotic calcifications. The pulmonary artery shows a normal branching pattern without intraluminal filling defect to suggest pulmonary embolism. Coronary calcifications are noted. Mild cardiac enlargement is noted. Mediastinum/Nodes: Esophagus is within normal limits. Scattered lymph nodes are noted in the mediastinum and right hilum similar to that seen on prior PET-CT examination. These are concentrated primarily in the precarinal, subcarinal and AP window regions. Thoracic inlet is within normal limits. Lungs/Pleura: Left lung is well aerated with diffuse emphysematous changes. Right lung shows a moderate to large pleural effusion. Persistent right lower lobe mass lesion is noted similar to prior PET-CT examination. Upper Abdomen: Visualized upper abdomen is within normal limits. Musculoskeletal:  Degenerative changes of the thoracic spine are noted. No rib abnormality is noted. Review of the MIP images confirms the above findings. IMPRESSION: Stable right lower lobe mass lesion with associated right hilar and mediastinal adenopathy stable from recent PET-CT. Moderate to large right-sided pleural effusion which appears increased from previous exam. No evidence of pulmonary emboli. No other focal abnormality is noted Aortic Atherosclerosis (ICD10-I70.0) and Emphysema (ICD10-J43.9). Electronically Signed   By: Inez Catalina M.D.   On: 07/23/2022 21:08   DG Chest 2 View  Result Date: 07/23/2022 CLINICAL DATA:  Shortness of breath EXAM: CHEST - 2 VIEW COMPARISON:  07/05/2022 FINDINGS: The heart size and mediastinal contours are within normal limits. Emphysema. New, small right pleural effusion associated atelectasis or consolidation. Disc degenerative disease of the thoracic spine. IMPRESSION: 1. New, small right pleural effusion and associated atelectasis or consolidation. 2. Emphysema. Electronically Signed   By: Delanna Ahmadi M.D.   On: 07/23/2022 15:59     Data Reviewed: Relevant notes from primary care and specialist visits, past discharge summaries as available in EHR, including Care Everywhere. 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 from midnight and SCDs for DVT prophylaxis Supplemental O2 to keep sats above 94% Patient states that  -he will not be interested in chemo or radiation given his age and frailty and he has discussed it with his pulmonologist, Dr. Raul Del -Would be interested in a Pleurx drain -Would like Dr. Raul Del be aware that he is in the hospital Consider palliative care consult   Colon cancer with  History of partial colectomy 2007 No acute issues  History of repair of aneurysm of abdominal aorta using endovascular stent graft No acute issues  Parkinson's disease Continue Sinemet  CAD S/P percutaneous coronary angioplasty Status post ST elevation myocardial infarction and PCI and stent placement of right coronary artery 09/17/2015  Continue aspirin and lovastatin Patient has no complaints of chest pain and EKG is nonacute.  Troponin negative  Chronic systolic CHF (congestive heart failure) (HCC) Ischemic cardiomyopathy EF 30% in 2020 Continue furosemide 20mg  daily        DVT prophylaxis: SCD  Consults: IR  Advance Care Planning:  DNR  Family Communication: none  Disposition Plan: Back to previous home environment  Severity of Illness: The appropriate patient status for this patient is OBSERVATION. Observation status is judged to be reasonable and necessary in order to provide the required intensity of service to ensure the patient's safety. The patient's presenting symptoms, physical exam findings, and initial radiographic and laboratory data in the context of their medical condition is felt to place them at decreased risk for further clinical deterioration. Furthermore, it is anticipated that the patient will be medically stable for discharge from the hospital within 2 midnights of admission.   Author: Athena Masse, MD 07/23/2022 10:01 PM  For on call review [email or web address].

## 2022-07-23 NOTE — Assessment & Plan Note (Addendum)
Continue aspirin and lovastatin

## 2022-07-23 NOTE — Assessment & Plan Note (Signed)
No acute issues.

## 2022-07-23 NOTE — H&P (Addendum)
History and Physical    Patient: Nicholas Parker:[id number] DOB: Aug 14, 1934 DOA: 07/23/2022 DOS: the patient was seen and examined on 07/23/2022 PCP: Adin Hector, MD  Patient coming from: Home  Chief Complaint:  Chief Complaint  Patient presents with   Shortness of Breath    HPI: Nicholas Parker is a 88 y.o. male with medical history significant for AAA s/p endovascular repair 2015, colon cancer s/p partial colectomy 2007, CAD s/p PCI to RCA 2017, Parkinson's disease 09/9526, systolic CHF with EF 41% 3244, with recently diagnosed primary bronchogenic neoplasm on 11/7 by PET scan with pleural effusion s/p thoracentesis on 11/8, who presents to the ED with a 3-day history of worsening of his chronic shortness of breath, placed on O2 at 2 L by EMS for transport.  He denies chest pain, cough fever or chills and denies nausea, vomiting or diaphoresis. ED course and data review:.  Tachypneic to 24 with O2 sat 95% on 2 L with otherwise normal vitals.  Troponin 15, BNP 456 and COVID and flu negative.  CBC and CMP for the most part unremarkable.  EKG, personally viewed and interpreted shows sinus at 95 with PVCs and nonspecific ST-T wave changes.  CTA PE protocol shows a stable right lower lobe mass with associated right hilar and mediastinal adenopathy from recent PET scan with moderate to large right-sided pleural effusion increased from prior exam, further details as follows: IMPRESSION: Stable right lower lobe mass lesion with associated right hilar and mediastinal adenopathy stable from recent PET-CT.   Moderate to large right-sided pleural effusion which appears increased from previous exam.   No evidence of pulmonary emboli.   No other focal abnormality is noted   Aortic Atherosclerosis (ICD10-I70.0) and Emphysema (ICD10-J43.9).      Patient was given 40 mg IV Lasix by EMS.  He was given an additional 20 mg in the ED.  He was maintained on O2 as he rapidly desaturated to 84% when  O2 via nasal cannula was removed.  Hospitalist consulted for admission.   Review of Systems: As mentioned in the history of present illness. All other systems reviewed and are negative.  Past Medical History:  Diagnosis Date   AAA (abdominal aortic aneurysm) (HCC)    Adenocarcinoma (HCC)    Anemia    B12 deficiency    Cardiomyopathy, ischemic 10/18/2015   Coronary artery disease    Diabetes mellitus without complication (HCC)    Diverticulosis    Dyspnea    GERD (gastroesophageal reflux disease)    Hyperlipidemia    Hypertension    Low testosterone    Myocardial infarction ([place]) 10/12/2015   neuropathy    Neuropathy    Skin cancer    Basal Cell   Ventricular tachycardia, sustained ([place]) 10/18/2015   Past Surgical History:  Procedure Laterality Date   ABDOMINAL AORTIC ANEURYSM REPAIR  2014   Dr. Delana Meyer, [facility]   arthrosopic rotator cuff repair Left    CARDIAC CATHETERIZATION N/A 09/17/2015   Procedure: Left Heart Cath and Coronary Angiography;  Surgeon: Wellington Hampshire, MD;  Location: [place] [facility];  Service: Cardiovascular;  Laterality: N/A;   CARDIAC CATHETERIZATION N/A 09/17/2015   Procedure: Coronary Stent Intervention;  Surgeon: Wellington Hampshire, MD;  Location: [place] [facility];  Service: Cardiovascular;  Laterality: N/A;   COLON SURGERY  05/2006   COLONOSCOPY WITH PROPOFOL N/A 04/13/2015   Procedure: COLONOSCOPY WITH PROPOFOL;  Surgeon: Lollie Sails, MD;  Location: [facility] ENDOSCOPY;  Service: Endoscopy;  Laterality: N/A;   COLONOSCOPY WITH PROPOFOL N/A 04/14/2015   Procedure: COLONOSCOPY WITH PROPOFOL;  Surgeon: Lollie Sails, MD;  Location: [facility] ENDOSCOPY;  Service: Endoscopy;  Laterality: N/A;   COLONOSCOPY WITH PROPOFOL N/A 07/20/2015   Procedure: COLONOSCOPY WITH PROPOFOL;  Surgeon: Lollie Sails, MD;  Location: [facility] ENDOSCOPY;  Service: Endoscopy;  Laterality: N/A;   CORONARY ANGIOPLASTY     ESOPHAGOGASTRODUODENOSCOPY N/A 08/29/2016   Procedure:  ESOPHAGOGASTRODUODENOSCOPY (EGD) with removal of foreign body;  Surgeon: Lollie Sails, MD;  Location: [facility] ENDOSCOPY;  Service: Endoscopy;  Laterality: N/A;   ESOPHAGOGASTRODUODENOSCOPY N/A 05/11/2021   Procedure: ESOPHAGOGASTRODUODENOSCOPY (EGD);  Surgeon: Toledo, Benay Pike, MD;  Location: [facility] ENDOSCOPY;  Service: Gastroenterology;  Laterality: N/A;   ESOPHAGOGASTRODUODENOSCOPY (EGD) WITH PROPOFOL N/A 09/29/2016   Procedure: ESOPHAGOGASTRODUODENOSCOPY (EGD) WITH PROPOFOL;  Surgeon: Lollie Sails, MD;  Location[facility] ENDOSCOPY;  Service: Endoscopy;  Laterality: N/A;   EYE SURGERY Bilateral    Cataract Extraction with IOL   LAPAROSCOPIC RIGHT COLECTOMY Right 09/14/2015   Procedure: LAPAROSCOPIC RIGHT COLECTOMY  CONVERTED TO OPEN RIGHT COLECTOMY, LYSIS OF ADHESIONS;  Surgeon: Leonie Green, MD;  Location: [facility] ORS;  Service: General;  Laterality: Right;   robotic colectomy     VASCULAR SURGERY     Social History:  reports that he quit smoking about 18 years ago. His smoking use included cigarettes. He smoked an average of 1.5 packs per day. He has never used smokeless tobacco. He reports current alcohol use of about 1.0 standard drink of alcohol per week. He reports that he does not use drugs.  Allergies  Allergen Reactions   Lipitor [Atorvastatin] Other (See Comments)    Reaction:  Leg cramps    Pravachol [Pravastatin Sodium] Other (See Comments)    Reaction:  Leg cramps   Riomet [Metformin Hcl] Diarrhea   Vytorin [Ezetimibe-Simvastatin] Other (See Comments)    Reaction:  Leg cramps    Family History  Problem Relation Age of Onset   CAD Mother    Diabetes type II Mother    Heart attack Mother    Lung cancer Father    Glaucoma Other     Prior to Admission medications   Medication Sig Start Date End Date Taking? Authorizing Provider  aspirin EC 81 MG tablet Take 81 mg by mouth daily.    [provider]  carbidopa-levodopa (SINEMET IR) 25-100 MG tablet Take 2  tablets by mouth 4 (four) times daily. 05/14/20   [provider]  Cholecalciferol 25 MCG (1000 UT) tablet Take by mouth.    [provider]  escitalopram (LEXAPRO) 10 MG tablet Take 10 mg by mouth daily. 04/15/21   [provider]  fludrocortisone (FLORINEF) 0.1 MG tablet Take 0.1 mg by mouth daily. 04/05/21   [provider]  lovastatin (MEVACOR) 40 MG tablet Take 40 mg by mouth at bedtime.    [provider]  midodrine (PROAMATINE) 5 MG tablet Take 5 mg by mouth 2 (two) times daily. 04/21/21   [provider]  montelukast (SINGULAIR) 10 MG tablet Take 10 mg by mouth daily. 03/23/21   [provider]  pantoprazole (PROTONIX) 40 MG tablet Take 40 mg by mouth daily.     [provider]  vitamin B-12 (CYANOCOBALAMIN) 500 MCG tablet Take 500 mcg by mouth daily.    [provider]    Physical Exam: Vitals:   07/23/22 1523 07/23/22 1524 07/23/22 1938 07/23/22 2145  BP: 123/78  138/70 Marland Kitchen)  150/69  Pulse: 93  78 72  Resp: (!) 24  (!) 22 (!) 32  Temp: 98 F (36.7 C)  98.2 F (36.8 C)   TempSrc: Oral  Oral   SpO2: 95%  95% 96%  Weight:  63.5 kg    Height:  5\' 7"  (1.702 m)     Physical Exam Vitals and nursing note reviewed.  Constitutional:      General: He is not in acute distress. HENT:     Head: Normocephalic and atraumatic.  Cardiovascular:     Rate and Rhythm: Normal rate and regular rhythm.     Heart sounds: Normal heart sounds.  Pulmonary:     Effort: Tachypnea present.     Breath sounds: Examination of the right-middle field reveals decreased breath sounds. Examination of the right-lower field reveals decreased breath sounds. Decreased breath sounds present.  Abdominal:     Palpations: Abdomen is soft.     Tenderness: There is no abdominal tenderness.  Neurological:     General: No focal deficit present.     Mental Status: He is oriented to person, place, and time. Mental status is at baseline.      Labs on Admission: I have personally reviewed following labs and imaging studies  CBC: Recent Labs  Lab 07/23/22 1457  WBC 10.0  NEUTROABS 8.3*  HGB 14.2  HCT 43.8  MCV 87.8  PLT 951   Basic Metabolic Panel: Recent Labs  Lab 07/23/22 1457  NA 142  K 4.3  CL 103  CO2 29  GLUCOSE 108*  BUN 20  CREATININE 0.92  CALCIUM 9.1   GFR: Estimated Creatinine Clearance: 51.8 mL/min (by C-G formula based on SCr of 0.92 mg/dL). Liver Function Tests: Recent Labs  Lab 07/23/22 1457  AST 18  ALT <5  ALKPHOS 68  BILITOT 1.3*  PROT 7.4  ALBUMIN 4.2   No results for input(s): "LIPASE", "AMYLASE" in the last 168 hours. No results for input(s): "AMMONIA" in the last 168 hours. Coagulation Profile: No results for input(s): "INR", "PROTIME" in the last 168 hours. Cardiac Enzymes: No results for input(s): "CKTOTAL", "CKMB", "CKMBINDEX", "TROPONINI" in the last 168 hours. BNP (last 3 results) No results for input(s): "PROBNP" in the last 8760 hours. HbA1C: No results for input(s): "HGBA1C" in the last 72 hours. CBG: No results for input(s): "GLUCAP" in the last 168 hours. Lipid Profile: No results for input(s): "CHOL", "HDL", "LDLCALC", "TRIG", "CHOLHDL", "LDLDIRECT" in the last 72 hours. Thyroid Function Tests: No results for input(s): "TSH", "T4TOTAL", "FREET4", "T3FREE", "THYROIDAB" in the last 72 hours. Anemia Panel: No results for input(s): "VITAMINB12", "FOLATE", "FERRITIN", "TIBC", "IRON", "RETICCTPCT" in the last 72 hours. Urine analysis:    Component Value Date/Time   COLORURINE YELLOW (A) 09/25/2015 0110   APPEARANCEUR CLOUDY (A) 09/25/2015 0110   APPEARANCEUR Clear 04/14/2014 0948   LABSPEC 1.019 09/25/2015 0110   LABSPEC 1.015 04/14/2014 0948   PHURINE 5.0 09/25/2015 0110   GLUCOSEU NEGATIVE 09/25/2015 0110   GLUCOSEU Negative 04/14/2014 0948   HGBUR 1+ (A) 09/25/2015 0110   BILIRUBINUR NEGATIVE 09/25/2015 0110   BILIRUBINUR Negative 04/14/2014 0948    KETONESUR TRACE (A) 09/25/2015 0110   PROTEINUR 30 (A) 09/25/2015 0110   NITRITE NEGATIVE 09/25/2015 0110   LEUKOCYTESUR NEGATIVE 09/25/2015 0110   LEUKOCYTESUR Trace 04/14/2014 0948    Radiological Exams on Admission: CT Angio Chest PE W/Cm &/Or Wo Cm  Result Date: 07/23/2022 CLINICAL DATA:  Shortness of breath for several days and history of recent  right-sided thoracentesis, initial encounter EXAM: CT ANGIOGRAPHY CHEST WITH CONTRAST TECHNIQUE: Multidetector CT imaging of the chest was performed using the standard protocol during bolus administration of intravenous contrast. Multiplanar CT image reconstructions and MIPs were obtained to evaluate the vascular anatomy. RADIATION DOSE REDUCTION: This exam was performed according to the departmental dose-optimization program which includes automated exposure control, adjustment of the mA and/or kV according to patient size and/or use of iterative reconstruction technique. CONTRAST:  156mL OMNIPAQUE IOHEXOL 350 MG/ML SOLN COMPARISON:  Chest x-ray from earlier in the same day, PET-CT from 07/04/2022 FINDINGS: Cardiovascular: Thoracic aorta demonstrates atherosclerotic calcifications. The pulmonary artery shows a normal branching pattern without intraluminal filling defect to suggest pulmonary embolism. Coronary calcifications are noted. Mild cardiac enlargement is noted. Mediastinum/Nodes: Esophagus is within normal limits. Scattered lymph nodes are noted in the mediastinum and right hilum similar to that seen on prior PET-CT examination. These are concentrated primarily in the precarinal, subcarinal and AP window regions. Thoracic inlet is within normal limits. Lungs/Pleura: Left lung is well aerated with diffuse emphysematous changes. Right lung shows a moderate to large pleural effusion. Persistent right lower lobe mass lesion is noted similar to prior PET-CT examination. Upper Abdomen: Visualized upper abdomen is within normal limits. Musculoskeletal:  Degenerative changes of the thoracic spine are noted. No rib abnormality is noted. Review of the MIP images confirms the above findings. IMPRESSION: Stable right lower lobe mass lesion with associated right hilar and mediastinal adenopathy stable from recent PET-CT. Moderate to large right-sided pleural effusion which appears increased from previous exam. No evidence of pulmonary emboli. No other focal abnormality is noted Aortic Atherosclerosis (ICD10-I70.0) and Emphysema (ICD10-J43.9). Electronically Signed   By: Inez Catalina M.D.   On: 07/23/2022 21:08   DG Chest 2 View  Result Date: 07/23/2022 CLINICAL DATA:  Shortness of breath EXAM: CHEST - 2 VIEW COMPARISON:  07/05/2022 FINDINGS: The heart size and mediastinal contours are within normal limits. Emphysema. New, small right pleural effusion associated atelectasis or consolidation. Disc degenerative disease of the thoracic spine. IMPRESSION: 1. New, small right pleural effusion and associated atelectasis or consolidation. 2. Emphysema. Electronically Signed   By: Delanna Ahmadi M.D.   On: 07/23/2022 15:59     Data Reviewed: Relevant notes from primary care and specialist visits, past discharge summaries as available in EHR, including Care Everywhere. 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 from midnight and SCDs for DVT prophylaxis Supplemental O2 to keep sats above 94% Patient states that  -he will not be interested in chemo or radiation given his age and frailty and he has discussed it with his pulmonologist, Dr. Raul Del -Would be interested in a Pleurx drain -Would like Dr. Raul Del be aware that he is in the hospital Consider palliative care consult   Colon cancer with  History of partial colectomy 2007 No acute issues  History of repair of aneurysm of abdominal aorta using endovascular stent graft No acute issues  Parkinson's disease Continue Sinemet  CAD S/P percutaneous coronary angioplasty Status post ST elevation myocardial infarction and PCI and stent placement of right coronary artery 09/17/2015  Continue aspirin and lovastatin Patient has no complaints of chest pain and EKG is nonacute.  Troponin negative  Chronic systolic CHF (congestive heart failure) (HCC) Ischemic cardiomyopathy EF 30% in 2020 Continue furosemide 20mg  daily        DVT prophylaxis: SCD  Consults: IR  Advance Care Planning:  DNR  Family Communication: none  Disposition Plan: Back to previous home environment  Severity of Illness: The appropriate patient status for this patient is OBSERVATION. Observation status is judged to be reasonable and necessary in order to provide the required intensity of service to ensure the patient's safety. The patient's presenting symptoms, physical exam findings, and initial radiographic and laboratory data in the context of their medical condition is felt to place them at decreased risk for further clinical deterioration. Furthermore, it is anticipated that the patient will be medically stable for discharge from the hospital within 2 midnights of admission.   Author: Athena Masse, MD 07/23/2022 10:01 PM  For on call review [email or web address].

## 2022-07-23 NOTE — ED Provider Triage Note (Signed)
  Emergency Medicine Provider Triage Evaluation Note  Nicholas Parker , a 88 y.o.male,  was evaluated in triage.  Pt complains of shortness of breath.  Patient states that he has been short of breath for the past few weeks.  He states that he was recently seen here for the same, where he underwent CT scans and fluid draw.  Denies any pain at this time.   Review of Systems  Positive: Shortness of breath Negative: Denies fever, chest pain, vomiting  Physical Exam  There were no vitals filed for this visit. Gen:   Awake, no distress   Resp:  Normal effort  MSK:   Moves extremities without difficulty  Other:    Medical Decision Making  Given the patient's initial medical screening exam, the following diagnostic evaluation has been ordered. The patient will be placed in the appropriate treatment space, once one is available, to complete the evaluation and treatment. I have discussed the plan of care with the patient and I have advised the patient that an ED physician or mid-level practitioner will reevaluate their condition after the test results have been received, as the results may give them additional insight into the type of treatment they may need.    Diagnostics: Labs, CXR, EKG  Treatments: none immediately   Teodoro Spray, [place] 07/23/22 1458

## 2022-07-23 NOTE — Assessment & Plan Note (Deleted)
Sliding scale insulin coverage for tonight

## 2022-07-23 NOTE — Assessment & Plan Note (Addendum)
Ischemic cardiomyopathy EF 30% in 2020 Hold Lasix with blood pressure being on the lower side.

## 2022-07-23 NOTE — ED Notes (Signed)
Ambulated pt from bed to hallway, back to bathroom to void and then to bed without O2.  SaO2 noted to be 84% and pt reports in distress.  Noted shallow tachypnea at 34/min.  Assisted into bed, O2 placed at 2lpm.  SaO2 increased to 94% rapidly with O2 in place.

## 2022-07-23 NOTE — ED Triage Notes (Signed)
Pt presents via EMS from home c/o SOB. Placed on 2L per EMS. 40mg  IV Lasix given by EMS.   20g right AC 20g left forearm

## 2022-07-23 NOTE — ED Provider Notes (Signed)
[facility] Provider Note    Event Date/Time   First MD Initiated Contact with Patient 07/23/22 1927     (approximate)   History   Shortness of Breath   HPI  Nicholas Parker is a 88 y.o. male with past medical history significant for CHF, COPD, recent pleural effusion and right lower lobe mass, AAA, who presents to the emergency department with shortness of breath.  Patient endorses recent removal of fluid from a area on his lung a couple of weeks ago.  States that he does not yet know the results.  Significantly worsening shortness of breath that worsened today.  States that he was unable to catch his breath and needed oxygen with EMS.  Denies any chest pain.  Endorses compliance with his home medications.  No history of DVT or PE.     Physical Exam   Triage Vital Signs: ED Triage Vitals  Enc Vitals Group     BP 07/23/22 1523 123/78     Pulse Rate 07/23/22 1523 93     Resp 07/23/22 1523 (!) 24     Temp 07/23/22 1523 98 F (36.7 C)     Temp Source 07/23/22 1523 Oral     SpO2 07/23/22 1523 95 %     Weight 07/23/22 1524 140 lb (63.5 kg)     Height 07/23/22 1524 5\' 7"  (1.702 m)     Head Circumference --      Peak Flow --      Pain Score 07/23/22 1524 0     Pain Loc --      Pain Edu? --      Excl. in [place]? --     Most recent vital signs: Vitals:   07/23/22 1938 07/23/22 2145  BP: 138/70 (!) 150/69  Pulse: 78 72  Resp: (!) 22 (!) 32  Temp: 98.2 F (36.8 C)   SpO2: 95% 96%    Physical Exam Constitutional:      Appearance: He is well-developed.  HENT:     Head: Atraumatic.  Eyes:     Conjunctiva/sclera: Conjunctivae normal.  Cardiovascular:     Rate and Rhythm: Regular rhythm.  Pulmonary:     Effort: Tachypnea and respiratory distress present.     Breath sounds: Rhonchi (Right lower lung) present.     Comments: Increased work of breathing.  84% with ambulation, placed on 2 L nasal cannula with improvement to 95%. Musculoskeletal:      Cervical back: Normal range of motion.     Right lower leg: No edema.     Left lower leg: No edema.  Skin:    General: Skin is warm.  Neurological:     Mental Status: He is alert. Mental status is at baseline.          IMPRESSION / MDM / ASSESSMENT AND PLAN / ED COURSE  I reviewed the triage vital signs and the nursing notes.  88 year old male with past medical history significant for prior tobacco use, COPD, EF of 30%, Parkinson's disease, who presents to the emergency department with shortness of breath.  Patient endorses worsening shortness of breath today.  On chart review of outside records patient had a pleural effusion on the right side and had a thoracentesis done approximately 3 weeks ago.  Pulmonology with [facility] [place] [facility] concerning for malignancy.    EKG  I, Nathaniel Man, the attending physician, personally viewed and interpreted this ECG.   Rate: Normal  Rhythm: Normal sinus  Axis:  Normal  Intervals: Right bundle branch block  ST&T Change: Nonspecific changes.  No significant change when compared to prior EKG  No tachycardic or bradycardic dysrhythmias while on cardiac telemetry.  RADIOLOGY I independently reviewed imaging, my interpretation of imaging: Chest x-ray with pleural effusion to the right lower lung  Read as right pleural effusion with atelectasis or consolidation.  CT angiography obtained given concern for possible malignancy diagnosis, no signs of pulmonary embolism.  Moderate to large pleural effusion increased in size when compared to recent study    ED Results / Procedures / Treatments   Labs (all labs ordered are listed, but only abnormal results are displayed) Labs interpreted as -   Mildly elevated BNP.  Creatinine at baseline.  No significant electrolyte abnormalities.  Troponin negative.  COVID and influenza testing negative. Labs Reviewed  BRAIN NATRIURETIC PEPTIDE - Abnormal; Notable for the following components:      Result  Value   B Natriuretic Peptide 456.2 (*)    All other components within normal limits  COMPREHENSIVE METABOLIC PANEL - Abnormal; Notable for the following components:   Glucose, Bld 108 (*)    Total Bilirubin 1.3 (*)    All other components within normal limits  CBC WITH DIFFERENTIAL/PLATELET - Abnormal; Notable for the following components:   Neutro Abs 8.3 (*)    Lymphs Abs 0.6 (*)    Basophils Absolute 0.2 (*)    All other components within normal limits  RESP PANEL BY RT-PCR (FLU A&B, COVID) [id number]  TROPONIN I (HIGH SENSITIVITY)  TROPONIN I (HIGH SENSITIVITY)    Clinical picture is concerning for acute hypoxia in the setting of pleural effusion.  PROCEDURES:  Critical Care performed: No  Procedures  Patient's presentation is most consistent with acute presentation with potential threat to life or bodily function.   MEDICATIONS ORDERED IN ED: Medications  iohexol (OMNIPAQUE) 350 MG/ML injection 100 mL (100 mLs Intravenous Contrast Given 07/23/22 2053)  furosemide (LASIX) injection 20 mg (20 mg Intravenous Given 07/23/22 2128)    FINAL CLINICAL IMPRESSION(S) / ED DIAGNOSES   Final diagnoses:  Hypoxia  Pleural effusion     Rx / DC Orders   ED Discharge Orders     None        Note:  This document was prepared using Dragon voice recognition software and may include unintentional dictation errors.   Nathaniel Man, MD 07/23/22 2218

## 2022-07-23 NOTE — IPAL (Signed)
  Interdisciplinary Goals of Care Family Meeting   Date carried out: 07/23/2022  Location of the meeting: Bedside  Member's involved: Physician  Durable Power of Attorney or acting medical decision maker: patient    Discussion: We discussed goals of care for [employer] .  Patient discussed that he is not interested in having chemo or radiation because he does not think that he will do well with it given his experience seeing others go through it when in the end it did not do much good.  He states he has had a lot of medical conditions including AAA repair, CAD and Parkinson's and has lived a good life and when the time comes he would like to go naturally. He states that he has lived longer than he expected to as his parents died in their 39s and 17s. He has discussed it with his wife and states he will not want to be resuscitated.    code status: Full DNR  Disposition: Continue current acute care  Time spent for the meeting: [place], MD  07/23/2022, 11:16 PM

## 2022-07-23 NOTE — ED Triage Notes (Signed)
Pt to ED from EMS from home for SOB x 3 days. Pt has had theses issues before and had to have a liter of fluid pulled from his chest and he feels this is the same. Pt is CAOx4 and in no acute distress at this time. Pt wearing oxygen placed by EMS,. Pt does not wear oxygen at home., Iv in place by EMS. Pt denies N/V/D.

## 2022-07-24 ENCOUNTER — Observation Stay: Payer: Medicare HMO

## 2022-07-24 ENCOUNTER — Inpatient Hospital Stay: Payer: Medicare HMO

## 2022-07-24 ENCOUNTER — Encounter: Payer: Self-pay | Admitting: Internal Medicine

## 2022-07-24 DIAGNOSIS — I255 Ischemic cardiomyopathy: Secondary | ICD-10-CM | POA: Diagnosis present

## 2022-07-24 DIAGNOSIS — J939 Pneumothorax, unspecified: Secondary | ICD-10-CM | POA: Diagnosis not present

## 2022-07-24 DIAGNOSIS — K219 Gastro-esophageal reflux disease without esophagitis: Secondary | ICD-10-CM | POA: Diagnosis present

## 2022-07-24 DIAGNOSIS — R0902 Hypoxemia: Secondary | ICD-10-CM

## 2022-07-24 DIAGNOSIS — J90 Pleural effusion, not elsewhere classified: Secondary | ICD-10-CM | POA: Diagnosis present

## 2022-07-24 DIAGNOSIS — C3401 Malignant neoplasm of right main bronchus: Principal | ICD-10-CM

## 2022-07-24 DIAGNOSIS — I700 Atherosclerosis of aorta: Secondary | ICD-10-CM | POA: Diagnosis present

## 2022-07-24 DIAGNOSIS — J439 Emphysema, unspecified: Secondary | ICD-10-CM | POA: Diagnosis present

## 2022-07-24 DIAGNOSIS — Z66 Do not resuscitate: Secondary | ICD-10-CM | POA: Diagnosis present

## 2022-07-24 DIAGNOSIS — I959 Hypotension, unspecified: Secondary | ICD-10-CM | POA: Diagnosis not present

## 2022-07-24 DIAGNOSIS — L89322 Pressure ulcer of left buttock, stage 2: Secondary | ICD-10-CM | POA: Diagnosis present

## 2022-07-24 DIAGNOSIS — J9601 Acute respiratory failure with hypoxia: Secondary | ICD-10-CM | POA: Diagnosis present

## 2022-07-24 DIAGNOSIS — J95811 Postprocedural pneumothorax: Secondary | ICD-10-CM

## 2022-07-24 DIAGNOSIS — Z85828 Personal history of other malignant neoplasm of skin: Secondary | ICD-10-CM | POA: Diagnosis not present

## 2022-07-24 DIAGNOSIS — I2721 Secondary pulmonary arterial hypertension: Secondary | ICD-10-CM | POA: Diagnosis present

## 2022-07-24 DIAGNOSIS — G20A1 Parkinson's disease without dyskinesia, without mention of fluctuations: Secondary | ICD-10-CM

## 2022-07-24 DIAGNOSIS — E1140 Type 2 diabetes mellitus with diabetic neuropathy, unspecified: Secondary | ICD-10-CM | POA: Diagnosis present

## 2022-07-24 DIAGNOSIS — Z1152 Encounter for screening for COVID-19: Secondary | ICD-10-CM | POA: Diagnosis not present

## 2022-07-24 DIAGNOSIS — I9589 Other hypotension: Secondary | ICD-10-CM

## 2022-07-24 DIAGNOSIS — Z87891 Personal history of nicotine dependence: Secondary | ICD-10-CM | POA: Diagnosis not present

## 2022-07-24 DIAGNOSIS — I2510 Atherosclerotic heart disease of native coronary artery without angina pectoris: Secondary | ICD-10-CM

## 2022-07-24 DIAGNOSIS — J910 Malignant pleural effusion: Secondary | ICD-10-CM | POA: Diagnosis not present

## 2022-07-24 DIAGNOSIS — I252 Old myocardial infarction: Secondary | ICD-10-CM | POA: Diagnosis not present

## 2022-07-24 DIAGNOSIS — I5022 Chronic systolic (congestive) heart failure: Secondary | ICD-10-CM

## 2022-07-24 DIAGNOSIS — I110 Hypertensive heart disease with heart failure: Secondary | ICD-10-CM | POA: Diagnosis present

## 2022-07-24 DIAGNOSIS — Z9861 Coronary angioplasty status: Secondary | ICD-10-CM | POA: Diagnosis not present

## 2022-07-24 DIAGNOSIS — E538 Deficiency of other specified B group vitamins: Secondary | ICD-10-CM | POA: Diagnosis present

## 2022-07-24 DIAGNOSIS — E785 Hyperlipidemia, unspecified: Secondary | ICD-10-CM | POA: Diagnosis present

## 2022-07-24 DIAGNOSIS — R918 Other nonspecific abnormal finding of lung field: Secondary | ICD-10-CM | POA: Diagnosis not present

## 2022-07-24 DIAGNOSIS — R590 Localized enlarged lymph nodes: Secondary | ICD-10-CM | POA: Diagnosis present

## 2022-07-24 LAB — BODY FLUID CELL COUNT WITH DIFFERENTIAL
Eos, Fluid: 27 %
Lymphs, Fluid: 52 %
Monocyte-Macrophage-Serous Fluid: 18 %
Neutrophil Count, Fluid: 2 %
Other Cells, Fluid: 1 %
Total Nucleated Cell Count, Fluid: 2982 cu mm

## 2022-07-24 LAB — LACTATE DEHYDROGENASE, PLEURAL OR PERITONEAL FLUID: LD, Fluid: 135 U/L — ABNORMAL HIGH (ref 3–23)

## 2022-07-24 LAB — PROTEIN, PLEURAL OR PERITONEAL FLUID: Total protein, fluid: 3.9 g/dL

## 2022-07-24 MED ORDER — MIDODRINE HCL 5 MG PO TABS
10.0000 mg | ORAL_TABLET | Freq: Three times a day (TID) | ORAL | Status: DC
  Administered 2022-07-24 – 2022-07-25 (×4): 10 mg via ORAL
  Filled 2022-07-24 (×4): qty 2

## 2022-07-24 MED ORDER — MIDODRINE HCL 5 MG PO TABS: 10.0000 mg | ORAL_TABLET | Freq: Three times a day (TID) | ORAL | Status: DC

## 2022-07-24 MED ORDER — LIDOCAINE HCL (PF) 1 % IJ SOLN
10.0000 mL | Freq: Once | INTRAMUSCULAR | Status: AC
  Administered 2022-07-24: 10 mL via INTRADERMAL

## 2022-07-24 MED ORDER — SODIUM CHLORIDE 0.9 % IV BOLUS
500.0000 mL | Freq: Once | INTRAVENOUS | Status: AC
  Administered 2022-07-24: 500 mL via INTRAVENOUS

## 2022-07-24 NOTE — Procedures (Signed)
Ultrasound-guided diagnostic and therapeutic right sided  thoracentesis performed yielding 850 liters of amber colored fluid. No immediate complications.   Diagnostic fluid was sent to the lab for further analysis. Follow-up chest x-ray shows small right sided pneumothorax. 2 hour follow up chest xray ordered. EBL is < 2 ml.

## 2022-07-24 NOTE — Assessment & Plan Note (Signed)
Small pneumothorax after thoracentesis.  Watching clinically.

## 2022-07-24 NOTE — Discharge Instructions (Signed)

## 2022-07-24 NOTE — Assessment & Plan Note (Addendum)
Patient on Florinef and midodrine as outpatient.  Increase midodrine to 3 times a day dosing.

## 2022-07-24 NOTE — Progress Notes (Signed)
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 Patient not interested in any treatment for this secondary to his age.  I mentioned the possibility of hospice to him.  I left a message with his wife.  I also with Dr. Raul Del a cell phone message that one of his patients is in the hospital.   Hypotension Check orthostatic vital signs.  Patient on Florinef and midodrine as outpatient.  Increase midodrine to 3 times a day dosing.  Pneumothorax Small pneumothorax after thoracentesis.  Watching clinically.  Colon cancer with History of partial colectomy 2007 No acute issues  History of repair of aneurysm of abdominal aorta using endovascular stent graft No acute issues  Parkinson's disease Continue Sinemet  CAD S/P percutaneous coronary angioplasty Continue aspirin and lovastatin   Chronic systolic CHF (congestive heart failure) ([place]) Ischemic cardiomyopathy EF 30% in 2020 Hold Lasix with blood pressure being on the lower side.        Subjective: Patient states that he improved after they placed the oxygen on.  Patient seen before thoracentesis today.  Came in with shortness of breath and found to have recurrent pleural effusion  Physical Exam: Vitals:   07/24/22 1123 07/24/22 1140 07/24/22 1241 07/24/22 1332  BP: (!) 85/55 117/63  (!) 102/49  Pulse: 72 76 73 79  Resp:  20 (!) 26 (!) 25  Temp:   (!) 97.5 F (36.4 C)   TempSrc:   Oral   SpO2: 99% 100% 100% 99%  Weight:      Height:       Physical Exam HENT:     Head: Normocephalic.     Mouth/Throat:     Pharynx: No oropharyngeal exudate.  Eyes:     General: Lids are normal.     Conjunctiva/sclera: Conjunctivae normal.  Cardiovascular:     Rate  and Rhythm: Normal rate and regular rhythm.     Heart sounds: Normal heart sounds, S1 normal and S2 normal.  Pulmonary:     Breath sounds: Examination of the right-middle field reveals decreased breath sounds and rhonchi. Examination of the right-lower field reveals decreased breath sounds and rhonchi. Examination of the left-lower field reveals decreased breath sounds. Decreased breath sounds and rhonchi present. No wheezing or rales.  Abdominal:     Palpations: Abdomen is soft.     Tenderness: There is no abdominal tenderness.  Musculoskeletal:     Right lower leg: No swelling.     Left lower leg: No swelling.  Skin:    General: Skin is warm.     Findings: No rash.  Neurological:     Mental Status: He is alert and oriented to person, place, and time.     Data Reviewed: Creatinine 0.92, BNP 456, hemoglobin 14.2 Chest x-ray post thoracentesis shows decreased right pleural effusion with small pneumothorax after thoracentesis, unchanged right lower lung mass. Second chest x-ray postthoracentesis shows a small right basilar pneumothorax which is stable from previous chest x-ray.  Family Communication: Left message for patient's wife  Disposition: Status is: Inpatient Remains inpatient appropriate because: Had pneumothorax status post thoracentesis today.  Monitor again overnight.  Planned Discharge Destination: Home    Time spent: 28 minutes  Author: Loletha Grayer, MD  07/24/2022 2:54 PM  For on call review [email or web address].

## 2022-07-25 ENCOUNTER — Inpatient Hospital Stay: Payer: Medicare HMO

## 2022-07-25 DIAGNOSIS — J910 Malignant pleural effusion: Secondary | ICD-10-CM | POA: Diagnosis not present

## 2022-07-25 DIAGNOSIS — J90 Pleural effusion, not elsewhere classified: Secondary | ICD-10-CM

## 2022-07-25 DIAGNOSIS — Z515 Encounter for palliative care: Secondary | ICD-10-CM

## 2022-07-25 DIAGNOSIS — J9601 Acute respiratory failure with hypoxia: Secondary | ICD-10-CM

## 2022-07-25 DIAGNOSIS — C3401 Malignant neoplasm of right main bronchus: Secondary | ICD-10-CM | POA: Diagnosis not present

## 2022-07-25 DIAGNOSIS — R918 Other nonspecific abnormal finding of lung field: Secondary | ICD-10-CM

## 2022-07-25 DIAGNOSIS — L8990 Pressure ulcer of unspecified site, unspecified stage: Secondary | ICD-10-CM | POA: Insufficient documentation

## 2022-07-25 DIAGNOSIS — I9589 Other hypotension: Secondary | ICD-10-CM | POA: Diagnosis not present

## 2022-07-25 LAB — CBC
HCT: 36.1 % — ABNORMAL LOW (ref 39.0–52.0)
Hemoglobin: 12 g/dL — ABNORMAL LOW (ref 13.0–17.0)
MCH: 29.1 pg (ref 26.0–34.0)
MCHC: 33.2 g/dL (ref 30.0–36.0)
MCV: 87.4 fL (ref 80.0–100.0)
Platelets: 246 10*3/uL (ref 150–400)
RBC: 4.13 MIL/uL — ABNORMAL LOW (ref 4.22–5.81)
RDW: 14.4 % (ref 11.5–15.5)
WBC: 9.4 10*3/uL (ref 4.0–10.5)
nRBC: 0 % (ref 0.0–0.2)

## 2022-07-25 LAB — BASIC METABOLIC PANEL
Anion gap: 7 (ref 5–15)
BUN: 21 mg/dL (ref 8–23)
CO2: 28 mmol/L (ref 22–32)
Calcium: 8.7 mg/dL — ABNORMAL LOW (ref 8.9–10.3)
Chloride: 103 mmol/L (ref 98–111)
Creatinine, Ser: 0.78 mg/dL (ref 0.61–1.24)
GFR, Estimated: >60 mL/min (ref >60)
Glucose, Bld: 99 mg/dL (ref 70–99)
Potassium: 3.4 mmol/L — ABNORMAL LOW (ref 3.5–5.1)
Sodium: 138 mmol/L (ref 135–145)

## 2022-07-25 LAB — CYTOLOGY - NON PAP

## 2022-07-25 LAB — MISC LABCORP TEST (SEND OUT): Labcorp test code: 9985

## 2022-07-25 MED ORDER — MIDODRINE HCL 10 MG PO TABS: 10.0000 mg | ORAL_TABLET | Freq: Three times a day (TID) | ORAL | 0 refills | Status: AC

## 2022-07-25 NOTE — Plan of Care (Signed)

## 2022-07-25 NOTE — Discharge Summary (Signed)
Physician Discharge Summary   Patient: Nicholas Parker MRN: [id number] DOB: 1934-05-08  Admit date:     07/23/2022  Discharge date: 07/25/22  Discharge Physician: Loletha Grayer   PCP: Adin Hector, MD   Recommendations at discharge:   Follow-up PCP Follow-up Dr. 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  Patient came in with shortness of breath and found to have a recurrent pleural effusion.  The patient was given Lasix and set up for a thoracentesis.  Thoracentesis was done which removed 850 mL of amber-colored fluid.  Chest x-ray postprocedure showed a small pneumothorax.  2 serial x-rays showed stable pneumothorax.  Dr. Raul Del saw the patient in consultation and will follow-up as outpatient.  Initially the patient required oxygen but improved with his respiratory status during hospital course.  Patient wanted to go home with oxygen.  Patient also does not want any treatment for this lung mass.  Patient and family agreeable to go home with hospice care.  Assessment and Plan: Recurrent right pleural effusion Thoracentesis on 1127 removed 850 mL of fluid.  Cytology not diagnostic of cancerous process.  I suspect this is a cancerous process but we are unable to make a diagnosis with thoracentesis done on 11/8 and a thoracentesis done  on 11/27.  High likelihood of this recurring again.  Follow-up closely with Dr. Raul Del as outpatient.  Lung mass Right lung mass suspicious for bronchogenic carcinoma.  PET scan positive.  Patient does not want any further treatment.  We will set up with hospice.  Acute hypoxic respiratory failure ([place]) Patient did have a pulse ox yesterday of 87% with ambulation.  Today briefly went down to 87% with ambulation.  Patient was set up with home oxygen.  Pneumothorax Small pneumothorax after thoracentesis.  Repeat chest x-ray stable.  By Dr. Raul Del as outpatient  Hypotension Patient on Florinef and midodrine as outpatient.  Increase midodrine to 3 times a day dosing.  Pressure ulcer Present on admission, see description below stage 2  Colon cancer with History of partial colectomy 2007 No acute issues  History of repair of aneurysm of abdominal aorta using endovascular stent graft No acute issues  Parkinson's disease Continue Sinemet  CAD S/P percutaneous coronary angioplasty Continue aspirin and lovastatin   Chronic systolic CHF (congestive heart failure) ([place]) Ischemic cardiomyopathy EF 30% in 2020 Can go back on Lasix as outpatient.         Consultants: Interventional radiology, pulmonary Procedures performed: Thoracentesis Disposition: Home with hospice Diet recommendation:  Cardiac diet DISCHARGE MEDICATION: Allergies as of 07/25/2022       Reactions   Lipitor [atorvastatin] Other (See Comments)   Reaction:  Leg cramps    Pravachol [pravastatin Sodium] Other (See Comments)   Reaction:  Leg cramps   Riomet [metformin Hcl] Diarrhea   Vytorin [ezetimibe-simvastatin] Other (See Comments)  Reaction:  Leg cramps        Medication List     STOP taking these medications    escitalopram 10 MG tablet Commonly known as: LEXAPRO       TAKE these medications    albuterol 108 (90 Base) MCG/ACT inhaler Commonly known as: VENTOLIN HFA Inhale 2 puffs  into the lungs every 6 (six) hours as needed for wheezing or shortness of breath.   aspirin EC 81 MG tablet Take 81 mg by mouth daily.   carbidopa-levodopa 25-100 MG tablet Commonly known as: SINEMET IR Take 2 tablets by mouth 4 (four) times daily.   Cholecalciferol 25 MCG (1000 UT) tablet Take 1,000 Units by mouth daily.   fludrocortisone 0.1 MG tablet Commonly known as: FLORINEF Take 0.1 mg by mouth daily.   furosemide 40 MG tablet Commonly known as: LASIX Take 20 mg by mouth daily.   lovastatin 20 MG tablet Commonly known as: MEVACOR Take 20 mg by mouth at bedtime.   midodrine 10 MG tablet Commonly known as: PROAMATINE Take 1 tablet (10 mg total) by mouth 3 (three) times daily with meals. What changed:  medication strength how much to take when to take this   montelukast 10 MG tablet Commonly known as: SINGULAIR Take 10 mg by mouth daily.   pantoprazole 40 MG tablet Commonly known as: PROTONIX Take 40 mg by mouth daily.   vitamin B-12 500 MCG tablet Commonly known as: CYANOCOBALAMIN Take 500 mcg by mouth daily.               Durable Medical Equipment  (From admission, onward)           Start     Ordered   07/25/22 1206  For home use only DME oxygen  Once       Question Answer Comment  Length of Need Lifetime   Mode or (Route) Nasal cannula   Liters per Minute 2   Frequency Continuous (stationary and portable oxygen unit needed)   Oxygen conserving device Yes   Oxygen delivery system Gas      07/25/22 1206            Follow-up Information     Tama High III, MD Follow up in 7 week(s).   Specialty: Internal Medicine Why: No answer, patient to make own follow up appt Contact information: [place] [address] [phone]         Erby Pian, MD. Go on 07/27/2022.   Specialty: Specialist Why: thursday at 10:15am Contact information: [place]  [phone]                 Discharge Exam: Filed Weights   07/23/22 1524  Weight: 63.5 kg   Physical Exam HENT:     Head: Normocephalic.     Mouth/Throat:     Pharynx: No oropharyngeal exudate.  Eyes:     General: Lids are normal.     Conjunctiva/sclera: Conjunctivae normal.  Cardiovascular:     Rate and Rhythm: Normal rate and regular rhythm.     Heart sounds: Normal heart sounds, S1 normal and S2 normal.  Pulmonary:     Breath sounds: Examination of the right-lower field reveals decreased breath sounds and rhonchi. Decreased breath sounds and rhonchi present. No wheezing or rales.  Abdominal:     Palpations: Abdomen is soft.     Tenderness: There is no abdominal tenderness.  Musculoskeletal:     Right  lower leg: No swelling.     Left lower leg: No swelling.  Skin:    General: Skin is warm.     Findings: No rash.  Neurological:     Mental Status: He is alert and oriented to person, place, and time.      Condition at discharge: fair  The results of significant diagnostics from this hospitalization (including imaging, microbiology, ancillary and laboratory) are listed below for reference.   Imaging Studies: DG Chest Port 1 View  Result Date: 07/25/2022 CLINICAL DATA:  Pneumothorax EXAM: PORTABLE CHEST 1 VIEW COMPARISON:  Yesterday FINDINGS: Decreased gas and increased fluid at the right base hydropneumothorax. Stable interstitial and airspace opacity at the lung bases, recently evaluated by CT. Normal heart size and mediastinal contours. IMPRESSION: Unchanged size of the hydropneumothorax at the right base with increased fluid component. Electronically Signed   By: Jorje Guild M.D.   On: 07/25/2022 07:12   DG Chest Port 1 View  Result Date: 07/24/2022 CLINICAL DATA:  Status post thoracentesis. EXAM: PORTABLE CHEST 1 VIEW COMPARISON:  Same day. FINDINGS: Stable small right basilar pneumothorax ex vacuo is noted status post thoracentesis. IMPRESSION: Stable small right basilar  pneumothorax ex vacuo is noted status post right thoracentesis. Electronically Signed   By: Marijo Conception M.D.   On: 07/24/2022 14:10   US THORACENTESIS ASP PLEURAL SPACE W/IMG GUIDE  Result Date: 07/24/2022 INDICATION: Patient history of CHF, COPD, recurrent right-sided pleural effusion. Presented to the ED with shortness of breath. Team is requesting therapeutic and diagnostic thoracentesis EXAM: ULTRASOUND GUIDED THERAPEUTIC AND DIAGNOSTIC RIGHT-SIDED THORACENTESIS MEDICATIONS: Lidocaine 1% 10 mL COMPLICATIONS: None immediate. PROCEDURE: An ultrasound guided thoracentesis was thoroughly discussed with the patient and questions answered. The benefits, risks, alternatives and complications were also discussed. The patient understands and wishes to proceed with the procedure. Written consent was obtained. Ultrasound was performed to localize and mark an adequate pocket of fluid in the right chest. The area was then prepped and draped in the normal sterile fashion. 1% Lidocaine was used for local anesthesia. Under ultrasound guidance a 6 Fr Safe-T-Centesis catheter was introduced. Thoracentesis was performed. The catheter was removed and a dressing applied. FINDINGS: A total of approximately 850 ml of amber colored fluid was removed. Samples were sent to the laboratory as requested by the clinical team. IMPRESSION: Successful ultrasound guided therapeutic and diagnostic right-sided thoracentesis yielding 850 ml of pleural fluid. Read by: Rushie Nyhan, NP Electronically Signed   By: Albin Felling M.D.   On: 07/24/2022 13:22   DG Chest Port 1 View  Result Date: 07/24/2022 CLINICAL DATA:  Status post thoracentesis EXAM: PORTABLE CHEST 1 VIEW COMPARISON:  Radiograph 07/23/2022, CT 07/23/2022 FINDINGS: Unchanged cardiomediastinal silhouette. Decreased right pleural effusion after thoracentesis. There is a small right-sided pneumothorax, most prominent at the lung base, with trace apical component.  Unchanged right lower lung mass. Diffuse interstitial prominence on a background of emphysema, similar to prior. Bilateral shoulder degenerative changes. Thoracic spondylosis. IMPRESSION: Decreased right pleural effusion with small pneumothorax after thoracentesis. Unchanged right lower lung mass. Unchanged mild interstitial prominence in the setting of emphysema. These results were discussed via [employer] messaging at the time of interpretation on 07/24/2022 at 11:37 am to provider [facility] , who acknowledged these results. Electronically Signed   By: Maurine Simmering M.D.   On: 07/24/2022 11:41   CT Angio Chest PE W/Cm &/Or Wo Cm  Result Date: 07/23/2022 CLINICAL DATA:  Shortness of breath for several days and history  of recent right-sided thoracentesis, initial encounter EXAM: CT ANGIOGRAPHY CHEST WITH CONTRAST TECHNIQUE: Multidetector CT imaging of the chest was performed using the standard protocol during bolus administration of intravenous contrast. Multiplanar CT image reconstructions and MIPs were obtained to evaluate the vascular anatomy. RADIATION DOSE REDUCTION: This exam was performed according to the departmental dose-optimization program which includes automated exposure control, adjustment of the mA and/or kV according to patient size and/or use of iterative reconstruction technique. CONTRAST:  110mL OMNIPAQUE IOHEXOL 350 MG/ML SOLN COMPARISON:  Chest x-ray from earlier in the same day, PET-CT from 07/04/2022 FINDINGS: Cardiovascular: Thoracic aorta demonstrates atherosclerotic calcifications. The pulmonary artery shows a normal branching pattern without intraluminal filling defect to suggest pulmonary embolism. Coronary calcifications are noted. Mild cardiac enlargement is noted. Mediastinum/Nodes: Esophagus is within normal limits. Scattered lymph nodes are noted in the mediastinum and right hilum similar to that seen on prior PET-CT examination. These are concentrated primarily in the  precarinal, subcarinal and AP window regions. Thoracic inlet is within normal limits. Lungs/Pleura: Left lung is well aerated with diffuse emphysematous changes. Right lung shows a moderate to large pleural effusion. Persistent right lower lobe mass lesion is noted similar to prior PET-CT examination. Upper Abdomen: Visualized upper abdomen is within normal limits. Musculoskeletal: Degenerative changes of the thoracic spine are noted. No rib abnormality is noted. Review of the MIP images confirms the above findings. IMPRESSION: Stable right lower lobe mass lesion with associated right hilar and mediastinal adenopathy stable from recent PET-CT. Moderate to large right-sided pleural effusion which appears increased from previous exam. No evidence of pulmonary emboli. No other focal abnormality is noted Aortic Atherosclerosis (ICD10-I70.0) and Emphysema (ICD10-J43.9). Electronically Signed   By: Inez Catalina M.D.   On: 07/23/2022 21:08   DG Chest 2 View  Result Date: 07/23/2022 CLINICAL DATA:  Shortness of breath EXAM: CHEST - 2 VIEW COMPARISON:  07/05/2022 FINDINGS: The heart size and mediastinal contours are within normal limits. Emphysema. New, small right pleural effusion associated atelectasis or consolidation. Disc degenerative disease of the thoracic spine. IMPRESSION: 1. New, small right pleural effusion and associated atelectasis or consolidation. 2. Emphysema. Electronically Signed   By: Delanna Ahmadi M.D.   On: 07/23/2022 15:59   US THORACENTESIS ASP PLEURAL SPACE W/IMG GUIDE  Result Date: 07/05/2022 INDICATION: Right pleural effusion EXAM: ULTRASOUND GUIDED RIGHT THORACENTESIS MEDICATIONS: 6 cc 1% lidocaine COMPLICATIONS: SIR Level A - No therapy, no consequence. PROCEDURE: An ultrasound guided thoracentesis was thoroughly discussed with the patient and questions answered. The benefits, risks, alternatives and complications were also discussed. The patient understands and wishes to proceed with the  procedure. Written consent was obtained. Ultrasound was performed to localize and mark an adequate pocket of fluid in the right chest. The area was then prepped and draped in the normal sterile fashion. 1% Lidocaine was used for local anesthesia. Under ultrasound guidance a 6 Fr Safe-T-Centesis catheter was introduced. Thoracentesis was performed. The catheter was removed and a dressing applied. FINDINGS: A total of approximately 800 cc of yellow fluid was removed. Samples were sent to the laboratory as requested by the clinical team. IMPRESSION: Successful ultrasound guided right thoracentesis yielding 800 cc of pleural fluid. Follow-up chest x-ray revealed the presence of a small right basilar pneumothorax ex vacuo. The patient was asymptomatic following chest x-ray, and declined offer of repeat chest x-ray instead opting to return home with strict return precautions. Read by: Reatha Armour, PA-C Electronically Signed   By: Jacqulynn Cadet M.D.   On:  07/05/2022 13:46   DG Chest Port 1 View  Result Date: 07/05/2022 CLINICAL DATA:  Right-sided thoracentesis EXAM: PORTABLE CHEST 1 VIEW COMPARISON:  PET-CT 07/04/2022 FINDINGS: Interval right-sided thoracentesis. Small ex vacuo pneumothorax at the right lung base due to incomplete re-expansion of the right lower lobe. No apical pneumothorax. No evidence of left-sided pleural effusion. Stable cardiac and mediastinal contours. Atherosclerotic calcifications again noted in the transverse aorta. IMPRESSION: Small right basilar ex vacuo pneumothorax following thoracentesis. This is almost certainly due to incomplete re-expansion of the right lower lobe. Electronically Signed   By: Jacqulynn Cadet M.D.   On: 07/05/2022 12:22   NM PET Image Initial (PI) Whole Body (F-18 FDG)  Result Date: 07/04/2022 CLINICAL DATA:  Initial treatment strategy for pulmonary mass. EXAM: NUCLEAR MEDICINE PET WHOLE BODY TECHNIQUE: 8.0 mCi F-18 FDG was injected intravenously. Full-ring  PET imaging was performed from the head to foot after the radiotracer. CT data was obtained and used for attenuation correction and anatomic localization. Fasting blood glucose: 77 mg/dl COMPARISON:  Chest CT 06/26/2022 FINDINGS: Mediastinal blood pool activity: SUV max 1.5 HEAD/NECK: No hypermetabolic activity in the scalp. No hypermetabolic cervical lymph nodes. Incidental CT findings: none CHEST: Clustered small lymph nodes seen in the left supraclavicular region (86/2) with low level FDG accumulation ( SUV max = 2.5). Mild precarinal lymphadenopathy (14 mm short axis) and subcarinal lymphadenopathy (15 mm short axis) is mildly hypermetabolic with SUV max = 3.5-5.7. Similar mild hypermetabolism noted in the right hilar region. The patient's known right lower lobe pulmonary lesion is hypermetabolic with SUV max = 4.4. Lesion measures 4.5 x 2.2 cm today compared to 4.4 x 2.2 cm previously. Incidental CT findings: Moderate right pleural effusion is slightly progressive in the interval. Centrilobular emphysema noted. No suspicious nodule or mass in the left lung. No left pleural effusion. ABDOMEN/PELVIS: No abnormal hypermetabolic activity within the liver, pancreas, adrenal glands, or spleen. No hypermetabolic lymph nodes in the abdomen or pelvis. Incidental CT findings: Numerous tiny calcified gallstones evident without gallbladder wall thickening or pericholecystic fluid. There is a tiny amount of gas there appears to be in the gallbladder lumen (axial image 175/series 2). No intrahepatic or extrahepatic biliary dilation. Status post aorto bi iliac endograft placement. No bowel obstruction. SKELETON: No focal hypermetabolic activity to suggest skeletal metastasis. Incidental CT findings: none EXTREMITIES: No abnormal hypermetabolic activity in the lower extremities. Incidental CT findings: Subcutaneous edema noted in the region of the right leg and ankle. IMPRESSION: 1. Right lower lobe pulmonary lesion shows low  level hypermetabolism with SUV max = 4.4. Imaging features are concerning for primary bronchogenic neoplasm. 2. Mild hypermetabolism in mediastinal and right hilar lymph nodes, suspicious for metastatic disease. 3. Clustered small lymph nodes in the left supraclavicular region with low level FDG accumulation. Metastatic disease a concern. 4. Moderate right pleural effusion, slightly progressive in the interval. 5. Cholelithiasis. There is a tiny gas bubble in the non dependent gallbladder lumen. This is of uncertain etiology and could be free gas or gas within a noncalcified cholesterol stone. No gallbladder wall thickening or pericholecystic fluid. No biliary dilation. Correlation for signs/symptoms of acute cholecystitis recommended. Follow-up ultrasound may prove helpful to further evaluate. 6. Subcutaneous edema in the region of the right leg and ankle. 7.  Emphysema (ICD10-J43.9). These results will be called to the ordering clinician or representative by the Radiologist Assistant, and communication documented in the PACS or [employer]. Electronically Signed   By: Verda Cumins.D.  On: 07/04/2022 11:46   CT CHEST WO CONTRAST  Result Date: 06/28/2022 CLINICAL DATA:  Shortness of breath, history of colon cancer, former smoker. * Tracking Code: BO * EXAM: CT CHEST WITHOUT CONTRAST TECHNIQUE: Multidetector CT imaging of the chest was performed following the standard protocol without IV contrast. RADIATION DOSE REDUCTION: This exam was performed according to the departmental dose-optimization program which includes automated exposure control, adjustment of the mA and/or kV according to patient size and/or use of iterative reconstruction technique. COMPARISON:  09/20/2016. FINDINGS: Cardiovascular: Atherosclerotic calcification of the aorta, aortic valve and coronary arteries. Enlarged pulmonic trunk and heart. No pericardial effusion. Mediastinum/Nodes: Low left internal jugular lymph nodes measure up  to 8 mm (2/9), new. Mediastinal lymph nodes measure up to 1.3 cm in the low right paratracheal station, previously 6 mm. Subcarinal lymph node measures 2 cm, previously 4 mm. Suspect right hilar adenopathy although definitive assessment is challenging without IV contrast. No axillary adenopathy. Esophagus is grossly unremarkable. Lungs/Pleura: Centrilobular emphysema. Small area of amorphous consolidation in the perihilar right lower lobe (3/82). Posteromedial right lower lobe mass measures 2.2 x 4.4 cm (3/92). Additional 5 mm subpleural nodule inferiorly within the posterior right lower lobe (3/114). Large, partially loculated right pleural effusion. Findings are new from 09/20/2016. Mild pleuroparenchymal scarring in the left upper lobe. Probable calcified granuloma in the medial left lower lobe, unchanged. 3 mm subpleural left lower lobe nodule (3/116), unchanged. No left pleural fluid. Airway is unremarkable. Upper Abdomen: Visualized portions of the liver, adrenal glands, kidneys, spleen, pancreas and stomach are grossly unremarkable. Visualized portion of the transverse colon is distended with gas. No upper abdominal adenopathy. Musculoskeletal: Degenerative changes in the spine. Flowing anterior osteophytosis in the thoracic spine. IMPRESSION: 1. Right lower lobe mass with right hilar and mediastinal adenopathy extending to the left paratracheal station and possibly low left internal jugular station. Findings are indicative of primary bronchogenic carcinoma. 2. Additional areas of nodular consolidation or nodularity in the right lower lobe, as detailed above. 3. Large, partially loculated right pleural effusion. 4. Aortic atherosclerosis (ICD10-I70.0). Coronary artery calcification. 5. Enlarged pulmonic trunk, indicative of pulmonary arterial hypertension. 6.  Emphysema (ICD10-J43.9). Electronically Signed   By: Lorin Picket M.D.   On: 06/28/2022 11:54    Microbiology: Results for orders placed or  performed during the hospital encounter of 07/23/22  Resp Panel by RT-PCR (Flu A&B, Covid) Anterior Nasal Swab     Status: None   Collection Time: 07/23/22  2:57 PM   Specimen: Anterior Nasal Swab  Result Value Ref Range Status   SARS Coronavirus 2 by RT PCR NEGATIVE NEGATIVE Final    Comment: (NOTE) SARS-CoV-2 target nucleic acids are NOT DETECTED.  The SARS-CoV-2 RNA is generally detectable in upper respiratory specimens during the acute phase of infection. The lowest concentration of SARS-CoV-2 viral copies this assay can detect is 138 copies/mL. A negative result does not preclude SARS-Cov-2 infection and should not be used as the sole basis for treatment or other patient management decisions. A negative result may occur with  improper specimen collection/handling, submission of specimen other than nasopharyngeal swab, presence of viral mutation(s) within the areas targeted by this assay, and inadequate number of viral copies(<138 copies/mL). A negative result must be combined with clinical observations, patient history, and epidemiological information. The expected result is Negative.  Fact Sheet for Patients:  EntrepreneurPulse.com.au  Fact Sheet for Healthcare Providers:  IncredibleEmployment.be  This test is no t yet approved or cleared by the [place]  FDA and  has been authorized for detection and/or diagnosis of SARS-CoV-2 by FDA under an Emergency Use Authorization (EUA). This EUA will remain  in effect (meaning this test can be used) for the duration of the COVID-19 declaration under Section 564(b)(1) of the Act, 21 U.S.C.section 360bbb-3(b)(1), unless the authorization is terminated  or revoked sooner.       Influenza A by PCR NEGATIVE NEGATIVE Final   Influenza B by PCR NEGATIVE NEGATIVE Final    Comment: (NOTE) The Xpert Xpress SARS-CoV-2/FLU/RSV plus assay is intended as an aid in the diagnosis of influenza from  Nasopharyngeal swab specimens and should not be used as a sole basis for treatment. Nasal washings and aspirates are unacceptable for Xpert Xpress SARS-CoV-2/FLU/RSV testing.  Fact Sheet for Patients: EntrepreneurPulse.com.au  Fact Sheet for Healthcare Providers: IncredibleEmployment.be  This test is not yet approved or cleared by the [place] FDA and has been authorized for detection and/or diagnosis of SARS-CoV-2 by FDA under an Emergency Use Authorization (EUA). This EUA will remain in effect (meaning this test can be used) for the duration of the COVID-19 declaration under Section 564(b)(1) of the Act, 21 U.S.C. section 360bbb-3(b)(1), unless the authorization is terminated or revoked.  Performed at [facility], [address] [place], [place] [address]     Labs: CBC: Recent Labs  Lab 07/23/22 1457 07/25/22 0350  WBC 10.0 9.4  NEUTROABS 8.3*  --   HGB 14.2 12.0*  HCT 43.8 36.1*  MCV 87.8 87.4  PLT 315 604   Basic Metabolic Panel: Recent Labs  Lab 07/23/22 1457 07/25/22 0350  NA 142 138  K 4.3 3.4*  CL 103 103  CO2 29 28  GLUCOSE 108* 99  BUN 20 21  CREATININE 0.92 0.78  CALCIUM 9.1 8.7*   Liver Function Tests: Recent Labs  Lab 07/23/22 1457  AST 18  ALT <5  ALKPHOS 68  BILITOT 1.3*  PROT 7.4  ALBUMIN 4.2     Discharge time spent: greater than 30 minutes.  Signed: Loletha Grayer, MD [facility] 07/25/2022

## 2022-07-25 NOTE — TOC Transition Note (Signed)
Transition of Care [facility]) - CM/SW Discharge Note   Patient Details  Name: Nicholas Parker MRN: [id number] Date of Birth: 12-31-1933  Transition of Care [facility]) CM/SW Contact:  Colen Darling, [place] Phone Number: 07/25/2022, 4:49 PM   Clinical Narrative:     TOC left a message for spouse. [employer] is delivering oxygen for the [facility].   Final next level of care: [place]     Patient Goals and [place]    Discharge Placement                Patient to be transferred to facility by: family Name of family member notified: Abbey Chatters [phone] Patient and family notified of of transfer: 07/25/22  Discharge Plan and Services                  DME Agency: [employer] (oxygen) Date DME Agency Contacted: 07/25/22 Time DME Agency Contacted: [phone]              Social Determinants of Health (SDOH) Interventions     Readmission Risk Interventions     No data to display

## 2022-07-25 NOTE — Assessment & Plan Note (Addendum)
Thoracentesis on 1127 removed 850 mL of fluid.  Cytology not diagnostic of cancerous process.  I suspect this is a cancerous process but we are unable to make a diagnosis with thoracentesis done on 11/8 and a thoracentesis done on 11/27.  High likelihood of this recurring again.  Follow-up closely with Dr. Raul Del as outpatient.

## 2022-07-25 NOTE — TOC Progression Note (Signed)
Transition of Care [facility]) - Progression Note    Patient Details  Name: Nicholas Parker MRN: [id number] Date of Birth: [birth year]  Transition of Care [facility]) CM/SW [place], [place] Phone Number: 07/25/2022, 2:11 PM  Clinical Narrative:     TOC has arranged home hospice oxygen and home hospice through [facility]. The agency will provide the patient with 2L O2 prn. They will bring the oxygen to the hospital prior to discharge. Family providing transport home today.       Expected Discharge Plan and [place]       Expected Discharge Date: 07/25/22                                     Social Determinants of Health (SDOH) Interventions    Readmission Risk Interventions     No data to display

## 2022-07-25 NOTE — Evaluation (Signed)
Physical Therapy Evaluation Patient Details Name: Nicholas Parker MRN: [id number] DOB: 03-30-1934 Today's Date: 07/25/2022  History of Present Illness  88 y/o male presented to ED on 07/23/22 for SOB x 3 days. CT showed stable R lower lobe mass lesion with moderate to large R pleural effusion. Admitted for recurrent malignant pleural effusion. S/p R thoracentesis on 11/27. PMH: AAA, colon cancer s/p partial colectomy 2007, CAD s/p PCI to RCA 2017, Parkinson's, bronchogenic neoplasm  Clinical Impression  Patient admitted with the above. PTA, patient lives with wife who assists patient as needed due to energy expenditure with daily tasks. Otherwise, patient is independent with no AD. Patient presents with weakness, impaired balance, and decreased activity tolerance. Overall, functioning at supervision level with no AD but only able to ambulate short distances before requiring seated rest break. Ambulated on RA with spO2 drop to 87% with recovery to 90% within 20-30 seconds after seated rest break. Patient will benefit from skilled PT services during acute stay to address listed deficits. Recommend HHPT at discharge to maximize endurance and independence at home.      Recommendations for follow up therapy are one component of a multi-disciplinary discharge planning process, led by the attending physician.  Recommendations may be updated based on patient status, additional functional criteria and insurance authorization.  Follow Up Recommendations Home health PT      Assistance Recommended at Discharge Frequent or constant Supervision/Assistance  Patient can return home with the following  A little help with walking and/or transfers;A little help with bathing/dressing/bathroom;Assistance with cooking/housework;Assist for transportation;Help with stairs or ramp for entrance    Equipment Recommendations None recommended by PT  Recommendations for Other Services       Functional Status Assessment  Patient has had a recent decline in their functional status and demonstrates the ability to make significant improvements in function in a reasonable and predictable amount of time.     Precautions / Restrictions Precautions Precautions: Fall Precaution Comments: hx of Parkinson's Restrictions Weight Bearing Restrictions: No      Mobility  Bed Mobility               General bed mobility comments: in recliner on arrival    Transfers Overall transfer level: Needs assistance Equipment used: None Transfers: Sit to/from Stand Sit to Stand: Supervision           General transfer comment: supervision from recliner for safety    Ambulation/Gait Ambulation/Gait assistance: Supervision Gait Distance (Feet): 25 Feet (+25') Assistive device: None Gait Pattern/deviations: Step-through pattern, Decreased stride length, Shuffle Gait velocity: decreased     General Gait Details: shuffle gait. Supervision for safety. No overt LOB. Ambulated on RA x 2 with spO2 drop to 87% but able to recover within 20-30 seconds to 90%. Seated rest break between bouts  Stairs            Wheelchair Mobility    Modified Rankin (Stroke Patients Only)       Balance Overall balance assessment: Mild deficits observed, not formally tested                                           Pertinent Vitals/Pain Pain Assessment Pain Assessment: No/denies pain    Home Living Family/patient expects to be discharged to:: Private residence Living Arrangements: Spouse/significant other Available Help at Discharge: Family;Available 24 hours/day Type of Home: House Home Access:  Stairs to enter Entrance Stairs-Rails: Right Entrance Stairs-Number of Steps: 3-4   Home Layout: Two level;Able to live on main level with bedroom/bathroom (basement but patient does not access) Home Equipment: Rolling Kessler Kopinski (2 wheels);Cane - single point;Shower seat - built in Brewing technologist)      Prior  Function Prior Level of Function : Needs assist             Mobility Comments: ambulates with no AD. Denies falls. ADLs Comments: requires assistance for energy conservation needs     Hand Dominance        Extremity/Trunk Assessment   Upper Extremity Assessment Upper Extremity Assessment: Generalized weakness    Lower Extremity Assessment Lower Extremity Assessment: Generalized weakness    Cervical / Trunk Assessment Cervical / Trunk Assessment: Kyphotic  Communication   Communication: No difficulties  Cognition Arousal/Alertness: Awake/alert Behavior During Therapy: WFL for tasks assessed/performed Overall Cognitive Status: Within Functional Limits for tasks assessed                                          General Comments General comments (skin integrity, edema, etc.): On 1L O2 on arrival, spO2 97%. Removed O2. At rest, spO2 94%. With ambulation on RA, spO2 dropped to 87% but quickly recovers to 90% within 20-30 seconds of seated rest break. Notified RN and MD    Exercises     Assessment/Plan    PT Assessment Patient needs continued PT services  PT Problem List Decreased strength;Decreased activity tolerance;Decreased balance;Decreased mobility;Cardiopulmonary status limiting activity       PT Treatment Interventions DME instruction;Gait training;Therapeutic activities;Stair training;Functional mobility training;Balance training;Therapeutic exercise;Patient/family education    PT Goals (Current goals can be found in the Care Plan section)  Acute Rehab PT Goals Patient Stated Goal: to go home with oxygen PT Goal Formulation: With patient/family Time For Goal Achievement: 08/08/22 Potential to Achieve Goals: Fair    Frequency Min 2X/week     Co-evaluation               AM-PAC PT "6 Clicks" Mobility  Outcome Measure Help needed turning from your back to your side while in a flat bed without using bedrails?: A Little Help needed  moving from lying on your back to sitting on the side of a flat bed without using bedrails?: A Little Help needed moving to and from a bed to a chair (including a wheelchair)?: A Little Help needed standing up from a chair using your arms (e.g., wheelchair or bedside chair)?: A Little Help needed to walk in hospital room?: A Little Help needed climbing 3-5 steps with a railing? : A Little 6 Click Score: 18    End of Session Equipment Utilized During Treatment: Gait belt Activity Tolerance: Patient tolerated treatment well Patient left: in chair;with call bell/phone within reach;with family/visitor present Nurse Communication: Mobility status PT Visit Diagnosis: Muscle weakness (generalized) (M62.81)    Time: 1017-5102 PT Time Calculation (min) (ACUTE ONLY): 26 min   Charges:   PT Evaluation $PT Eval Moderate Complexity: 1 Mod PT Treatments $Therapeutic Activity: 8-22 mins        Majour Frei A. Gilford Rile PT, DPT [facility] - Acute Rehabilitation Services   Kalandra Masters A Azaiah Mello 07/25/2022, 11:28 AM

## 2022-07-25 NOTE — Progress Notes (Signed)
SATURATION QUALIFICATIONS: (This note is used to comply with regulatory documentation for home oxygen)  Patient Saturations on Room Air at Rest = 94%  Patient Saturations on Room Air while Ambulating = 87% with recovery to 90% within 20-30 seconds of seated rest break   Please briefly explain why patient needs home oxygen: Patient with 3/4 DOE with mobility on RA with spO2 drop to 87% with recovery to 90% after 20-30 seconds of seated rest break.   Joelle Flessner A. Gilford Rile PT, DPT [facility] - Acute Rehabilitation Services

## 2022-07-25 NOTE — Progress Notes (Signed)
                                                     Palliative Care Progress Note   Patient Name: Nicholas Parker       Date: 07/25/2022 DOB: Jun 25, 1934  Age: 88 y.o. MRN#: [id number] Attending Physician: Loletha Grayer, MD Primary Care Physician: Adin Hector, MD Admit Date: 07/23/2022  Consult received. After reviewing the patient's chart and discussing with attending Dr. Leslye Peer, patient has decided to go home with hospice services to follow. DNR remains.  TOC is organizing hospice agencies/DME. Discharge is set for today, 11/28.  No palliative needs at this time.  Attending is in agreement. I will discontinue PMT consult.  Please re-engage if goals change, at patient/family's request, or if patient's health deteriorates during hospitalization.  Thank you for allowing the Palliative Medicine Team to assist in the care of Nicholas Parker.  [place] Ilsa Iha, FNP-BC Palliative Medicine Team Team Phone # [phone]  NO CHARGE

## 2022-07-25 NOTE — Assessment & Plan Note (Signed)
Right lung mass suspicious for bronchogenic carcinoma.  PET scan positive.  Patient does not want any further treatment.  We will set up with hospice.

## 2022-07-25 NOTE — Assessment & Plan Note (Signed)
Patient did have a pulse ox yesterday of 87% with ambulation.  Today briefly went down to 87% with ambulation.  Patient was set up with home oxygen.

## 2022-07-25 NOTE — Assessment & Plan Note (Signed)
Present on admission, see description below stage 2

## 2022-07-25 NOTE — Progress Notes (Signed)
Patient being discharged home. PIV removed. Went over discharge instructions and medications with patient and family. They stated they understood. Discharge was on delay because they were waiting on home oxygen. Oxygen was delivered. Patient going home POV with patients spouse and daughter.

## 2022-07-25 NOTE — Consult Note (Addendum)
Pulmonary Medicine          Date: 07/25/2022,   MRN# [id number] Nicholas Parker September 20, 1933     AdmissionWeight: 63.5 kg                 CurrentWeight: 63.5 kg      CHIEF COMPLAINT:   Recurrent right plural effusion   HISTORY OF PRESENT ILLNESS   Nicholas Parker is an elderly 4 byr old male, came in with dyspnea ( acute on chronic) and recurrenyt right pleural effusion.s/p right thoracentesis, breathing easier. Has a small ptx, no expansion on today's cxr. Discussed with family the suspicios of lung cancer, complicated by pleural effusion. Suspect the effusion will re occur.  1. Right lower lobe mass with right hilar and mediastinal adenopathy  extending to the left paratracheal station and possibly low left  internal jugular station. Findings are indicative of primary  bronchogenic carcinoma.  2. Additional areas of nodular consolidation or nodularity in the  right lower lobe, as detailed above.  3. Large, partially loculated right pleural effusion.  4. Aortic atherosclerosis (ICD10-I70.0). Coronary artery  calcification.  5. Enlarged pulmonic trunk, indicative of pulmonary arterial  hypertension.  6. Emphysema (ICD10-J43.9).  Electronically Signed  By: Lorin Picket M.D.  On: 06/28/2022 11:54   He does not want xrt or chemo, hence a biopsy was not pursued. The first thoracentesis, the cytology was negative.     PAST MEDICAL HISTORY   Past Medical History:  Diagnosis Date   AAA (abdominal aortic aneurysm) ([place])    Adenocarcinoma (HCC)    Anemia    B12 deficiency    Cardiomyopathy, ischemic 10/18/2015   Coronary artery disease    Diabetes mellitus without complication (HCC)    Diverticulosis    Dyspnea    GERD (gastroesophageal reflux disease)    Hyperlipidemia    Hypertension    Low testosterone    Myocardial infarction ([place]) 10/12/2015   neuropathy    Neuropathy    Skin cancer    Basal Cell   Ventricular tachycardia, sustained ([place]) 10/18/2015      SURGICAL HISTORY   Past Surgical History:  Procedure Laterality Date   ABDOMINAL AORTIC ANEURYSM REPAIR  2014   Dr. Delana Meyer, [facility]   arthrosopic rotator cuff repair Left    CARDIAC CATHETERIZATION N/A 09/17/2015   Procedure: Left Heart Cath and Coronary Angiography;  Surgeon: Wellington Hampshire, MD;  Location: [facility];  Service: Cardiovascular;  Laterality: N/A;   CARDIAC CATHETERIZATION N/A 09/17/2015   Procedure: Coronary Stent Intervention;  Surgeon: Wellington Hampshire, MD;  Location: [facility];  Service: Cardiovascular;  Laterality: N/A;   COLON SURGERY  05/2006   COLONOSCOPY WITH PROPOFOL N/A 04/13/2015   Procedure: COLONOSCOPY WITH PROPOFOL;  Surgeon: Lollie Sails, MD;  Location[facility] ENDOSCOPY;  Service: Endoscopy;  Laterality: N/A;   COLONOSCOPY WITH PROPOFOL N/A 04/14/2015   Procedure: COLONOSCOPY WITH PROPOFOL;  Surgeon: Lollie Sails, MD;  Location: [facility] ENDOSCOPY;  Service: Endoscopy;  Laterality: N/A;   COLONOSCOPY WITH PROPOFOL N/A 07/20/2015   Procedure: COLONOSCOPY WITH PROPOFOL;  Surgeon: Lollie Sails, MD;  Location: [facility] ENDOSCOPY;  Service: Endoscopy;  Laterality: N/A;   CORONARY ANGIOPLASTY     ESOPHAGOGASTRODUODENOSCOPY N/A 08/29/2016   Procedure: ESOPHAGOGASTRODUODENOSCOPY (EGD) with removal of foreign body;  Surgeon: Lollie Sails, MD;  Location: [facility] ENDOSCOPY;  Service: Endoscopy;  Laterality: N/A;   ESOPHAGOGASTRODUODENOSCOPY N/A 05/11/2021   Procedure: ESOPHAGOGASTRODUODENOSCOPY (EGD);  Surgeon: [place], Mosier  K, MD;  Location: [facility] ENDOSCOPY;  Service: Gastroenterology;  Laterality: N/A;   ESOPHAGOGASTRODUODENOSCOPY (EGD) WITH PROPOFOL N/A 09/29/2016   Procedure: ESOPHAGOGASTRODUODENOSCOPY (EGD) WITH PROPOFOL;  Surgeon: Lollie Sails, MD;  Location: [facility] ENDOSCOPY;  Service: Endoscopy;  Laterality: N/A;   EYE SURGERY Bilateral    Cataract Extraction with IOL   LAPAROSCOPIC RIGHT COLECTOMY Right 09/14/2015   Procedure:  LAPAROSCOPIC RIGHT COLECTOMY  CONVERTED TO OPEN RIGHT COLECTOMY, LYSIS OF ADHESIONS;  Surgeon: Leonie Green, MD;  Location: [facility] ORS;  Service: General;  Laterality: Right;   robotic colectomy     VASCULAR SURGERY       FAMILY HISTORY   Family History  Problem Relation Age of Onset   CAD Mother    Diabetes type II Mother    Heart attack Mother    Lung cancer Father    Glaucoma Other      SOCIAL HISTORY   Social History   Tobacco Use   Smoking status: Former    Packs/day: 1.50    Types: Cigarettes    Quit date: 10/03/2003    Years since quitting: 18.8   Smokeless tobacco: Never  Substance Use Topics   Alcohol use: Yes    Alcohol/week: 1.0 standard drink of alcohol    Types: 1 Cans of beer per week   Drug use: No     MEDICATIONS    Home Medication:    Current Medication:  Current Facility-Administered Medications:    acetaminophen (TYLENOL) tablet 650 mg, 650 mg, Oral, Q6H PRN **OR** acetaminophen (TYLENOL) suppository 650 mg, 650 mg, Rectal, Q6H PRN, Athena Masse, MD   albuterol (PROVENTIL) (2.5 MG/3ML) 0.083% nebulizer solution 2.5 mg, 2.5 mg, Nebulization, Q2H PRN, Athena Masse, MD   aspirin EC tablet 81 mg, 81 mg, Oral, Daily, Athena Masse, MD, 81 mg at 07/25/22 8127   carbidopa-levodopa (SINEMET IR) 25-100 MG per tablet immediate release 2 tablet, 2 tablet, Oral, QID, Athena Masse, MD, 2 tablet at 07/25/22 5170   fludrocortisone (FLORINEF) tablet 0.1 mg, 0.1 mg, Oral, Daily, Judd Gaudier V, MD, 0.1 mg at 07/25/22 0924   midodrine (PROAMATINE) tablet 10 mg, 10 mg, Oral, TID WC, Wieting, Richard, MD, 10 mg at 07/25/22 0924   ondansetron (ZOFRAN) tablet 4 mg, 4 mg, Oral, Q6H PRN **OR** ondansetron (ZOFRAN) injection 4 mg, 4 mg, Intravenous, Q6H PRN, Athena Masse, MD   pantoprazole (PROTONIX) EC tablet 40 mg, 40 mg, Oral, Daily, Judd Gaudier V, MD, 40 mg at 07/25/22 0174   pravastatin (PRAVACHOL) tablet 10 mg, 10 mg, Oral, q1800, Athena Masse, MD, 10 mg at 07/24/22 1753    ALLERGIES   Lipitor [atorvastatin], Pravachol [pravastatin sodium], Riomet [metformin hcl], and Vytorin [ezetimibe-simvastatin]     REVIEW OF SYSTEMS    Review of Systems:  Gen:  Denies  fever, sweats, chills weigh loss  HEENT: Denies blurred vision, double vision, ear pain, eye pain, hearing loss, nose bleeds, sore throat Cardiac:  No dizziness, chest pain or heaviness, chest tightness,edema Resp:   Denies cough or sputum porduction, ++ shortness of breath with activity,no wheezing, hemoptysis,  Gi: Denies swallowing difficulty, stomach pain, nausea or vomiting, diarrhea, constipation, bowel incontinence Gu:  Denies bladder incontinence, burning urine Ext:   Denies Joint pain, stiffness or swelling Skin: Denies  skin rash, easy bruising or bleeding or hives Endoc:  Denies polyuria, polydipsia , polyphagia or weight change Psych:   Denies depression, insomnia or hallucinations   Other:  All  other systems negative   VS: BP (!) 121/50   Pulse (!) 51   Temp 98.4 F (36.9 C)   Resp (!) 24   Ht 5\' 7"  (1.702 m)   Wt 63.5 kg   SpO2 100%   BMI 21.93 kg/m      PHYSICAL EXAM    GENERAL:sitting in chair, eating, frial family in the room HEAD: Normocephalic, atraumatic.  EYES: Pupils equal, round, reactive to light. Extraocular muscles intact. No scleral icterus.  MOUTH: Moist mucosal membrane. Dentition intact. No abscess noted.  EAR, NOSE, THROAT: Clear without exudates. No external lesions.  NECK: Supple. No thyromegaly. No nodules. No JVD.  PULMONARY: Diffuse coarse rhonchi right sided +wheezes CARDIOVASCULAR: S1 and S2. Regular rate and rhythm. No murmurs, rubs, or gallops. No edema. Pedal pulses 2+ bilaterally.  GASTROINTESTINAL: Soft, nontender, nondistended. No masses. Positive bowel sounds. No hepatosplenomegaly.  MUSCULOSKELETAL: No swelling, clubbing, or edema. Range of motion full in all extremities.  NEUROLOGIC: Cranial nerves  II through XII are intact. No gross focal neurological deficits. Sensation intact. Reflexes intact.  SKIN: No ulceration, lesions, rashes, or cyanosis. Skin warm and dry. Turgor intact.  PSYCHIATRIC: Mood, affect within normal limits. The patient is awake, alert and oriented x 3. Insight, judgment intact.       IMAGING    DG Chest Port 1 View  Result Date: 07/25/2022 CLINICAL DATA:  Pneumothorax EXAM: PORTABLE CHEST 1 VIEW COMPARISON:  Yesterday FINDINGS: Decreased gas and increased fluid at the right base hydropneumothorax. Stable interstitial and airspace opacity at the lung bases, recently evaluated by CT. Normal heart size and mediastinal contours. IMPRESSION: Unchanged size of the hydropneumothorax at the right base with increased fluid component. Electronically Signed   By: Jorje Guild M.D.   On: 07/25/2022 07:12   DG Chest Port 1 View  Result Date: 07/24/2022 CLINICAL DATA:  Status post thoracentesis. EXAM: PORTABLE CHEST 1 VIEW COMPARISON:  Same day. FINDINGS: Stable small right basilar pneumothorax ex vacuo is noted status post thoracentesis. IMPRESSION: Stable small right basilar pneumothorax ex vacuo is noted status post right thoracentesis. Electronically Signed   By: Marijo Conception M.D.   On: 07/24/2022 14:10   US THORACENTESIS ASP PLEURAL SPACE W/IMG GUIDE  Result Date: 07/24/2022 INDICATION: Patient history of CHF, COPD, recurrent right-sided pleural effusion. Presented to the ED with shortness of breath. Team is requesting therapeutic and diagnostic thoracentesis EXAM: ULTRASOUND GUIDED THERAPEUTIC AND DIAGNOSTIC RIGHT-SIDED THORACENTESIS MEDICATIONS: Lidocaine 1% 10 mL COMPLICATIONS: None immediate. PROCEDURE: An ultrasound guided thoracentesis was thoroughly discussed with the patient and questions answered. The benefits, risks, alternatives and complications were also discussed. The patient understands and wishes to proceed with the procedure. Written consent was  obtained. Ultrasound was performed to localize and mark an adequate pocket of fluid in the right chest. The area was then prepped and draped in the normal sterile fashion. 1% Lidocaine was used for local anesthesia. Under ultrasound guidance a 6 Fr Safe-T-Centesis catheter was introduced. Thoracentesis was performed. The catheter was removed and a dressing applied. FINDINGS: A total of approximately 850 ml of amber colored fluid was removed. Samples were sent to the laboratory as requested by the clinical team. IMPRESSION: Successful ultrasound guided therapeutic and diagnostic right-sided thoracentesis yielding 850 ml of pleural fluid. Read by: Rushie Nyhan, NP Electronically Signed   By: Albin Felling M.D.   On: 07/24/2022 13:22   DG Chest Port 1 View  Result Date: 07/24/2022 CLINICAL DATA:  Status post thoracentesis EXAM: PORTABLE  CHEST 1 VIEW COMPARISON:  Radiograph 07/23/2022, CT 07/23/2022 FINDINGS: Unchanged cardiomediastinal silhouette. Decreased right pleural effusion after thoracentesis. There is a small right-sided pneumothorax, most prominent at the lung base, with trace apical component. Unchanged right lower lung mass. Diffuse interstitial prominence on a background of emphysema, similar to prior. Bilateral shoulder degenerative changes. Thoracic spondylosis. IMPRESSION: Decreased right pleural effusion with small pneumothorax after thoracentesis. Unchanged right lower lung mass. Unchanged mild interstitial prominence in the setting of emphysema. These results were discussed via [employer] messaging at the time of interpretation on 07/24/2022 at 11:37 am to provider [facility] , who acknowledged these results. Electronically Signed   By: Maurine Simmering M.D.   On: 07/24/2022 11:41   CT Angio Chest PE W/Cm &/Or Wo Cm  Result Date: 07/23/2022 CLINICAL DATA:  Shortness of breath for several days and history of recent right-sided thoracentesis, initial encounter EXAM: CT ANGIOGRAPHY CHEST  WITH CONTRAST TECHNIQUE: Multidetector CT imaging of the chest was performed using the standard protocol during bolus administration of intravenous contrast. Multiplanar CT image reconstructions and MIPs were obtained to evaluate the vascular anatomy. RADIATION DOSE REDUCTION: This exam was performed according to the departmental dose-optimization program which includes automated exposure control, adjustment of the mA and/or kV according to patient size and/or use of iterative reconstruction technique. CONTRAST:  171mL OMNIPAQUE IOHEXOL 350 MG/ML SOLN COMPARISON:  Chest x-ray from earlier in the same day, PET-CT from 07/04/2022 FINDINGS: Cardiovascular: Thoracic aorta demonstrates atherosclerotic calcifications. The pulmonary artery shows a normal branching pattern without intraluminal filling defect to suggest pulmonary embolism. Coronary calcifications are noted. Mild cardiac enlargement is noted. Mediastinum/Nodes: Esophagus is within normal limits. Scattered lymph nodes are noted in the mediastinum and right hilum similar to that seen on prior PET-CT examination. These are concentrated primarily in the precarinal, subcarinal and AP window regions. Thoracic inlet is within normal limits. Lungs/Pleura: Left lung is well aerated with diffuse emphysematous changes. Right lung shows a moderate to large pleural effusion. Persistent right lower lobe mass lesion is noted similar to prior PET-CT examination. Upper Abdomen: Visualized upper abdomen is within normal limits. Musculoskeletal: Degenerative changes of the thoracic spine are noted. No rib abnormality is noted. Review of the MIP images confirms the above findings. IMPRESSION: Stable right lower lobe mass lesion with associated right hilar and mediastinal adenopathy stable from recent PET-CT. Moderate to large right-sided pleural effusion which appears increased from previous exam. No evidence of pulmonary emboli. No other focal abnormality is noted Aortic  Atherosclerosis (ICD10-I70.0) and Emphysema (ICD10-J43.9). Electronically Signed   By: Inez Catalina M.D.   On: 07/23/2022 21:08   DG Chest 2 View  Result Date: 07/23/2022 CLINICAL DATA:  Shortness of breath EXAM: CHEST - 2 VIEW COMPARISON:  07/05/2022 FINDINGS: The heart size and mediastinal contours are within normal limits. Emphysema. New, small right pleural effusion associated atelectasis or consolidation. Disc degenerative disease of the thoracic spine. IMPRESSION: 1. New, small right pleural effusion and associated atelectasis or consolidation. 2. Emphysema. Electronically Signed   By: Delanna Ahmadi M.D.   On: 07/23/2022 15:59   US THORACENTESIS ASP PLEURAL SPACE W/IMG GUIDE  Result Date: 07/05/2022 INDICATION: Right pleural effusion EXAM: ULTRASOUND GUIDED RIGHT THORACENTESIS MEDICATIONS: 6 cc 1% lidocaine COMPLICATIONS: SIR Level A - No therapy, no consequence. PROCEDURE: An ultrasound guided thoracentesis was thoroughly discussed with the patient and questions answered. The benefits, risks, alternatives and complications were also discussed. The patient understands and wishes to proceed with the procedure. Written consent was obtained.  Ultrasound was performed to localize and mark an adequate pocket of fluid in the right chest. The area was then prepped and draped in the normal sterile fashion. 1% Lidocaine was used for local anesthesia. Under ultrasound guidance a 6 Fr Safe-T-Centesis catheter was introduced. Thoracentesis was performed. The catheter was removed and a dressing applied. FINDINGS: A total of approximately 800 cc of yellow fluid was removed. Samples were sent to the laboratory as requested by the clinical team. IMPRESSION: Successful ultrasound guided right thoracentesis yielding 800 cc of pleural fluid. Follow-up chest x-ray revealed the presence of a small right basilar pneumothorax ex vacuo. The patient was asymptomatic following chest x-ray, and declined offer of repeat chest  x-ray instead opting to return home with strict return precautions. Read by: Reatha Armour, PA-C Electronically Signed   By: Jacqulynn Cadet M.D.   On: 07/05/2022 13:46   DG Chest Port 1 View  Result Date: 07/05/2022 CLINICAL DATA:  Right-sided thoracentesis EXAM: PORTABLE CHEST 1 VIEW COMPARISON:  PET-CT 07/04/2022 FINDINGS: Interval right-sided thoracentesis. Small ex vacuo pneumothorax at the right lung base due to incomplete re-expansion of the right lower lobe. No apical pneumothorax. No evidence of left-sided pleural effusion. Stable cardiac and mediastinal contours. Atherosclerotic calcifications again noted in the transverse aorta. IMPRESSION: Small right basilar ex vacuo pneumothorax following thoracentesis. This is almost certainly due to incomplete re-expansion of the right lower lobe. Electronically Signed   By: Jacqulynn Cadet M.D.   On: 07/05/2022 12:22   NM PET Image Initial (PI) Whole Body (F-18 FDG)  Result Date: 07/04/2022 CLINICAL DATA:  Initial treatment strategy for pulmonary mass. EXAM: NUCLEAR MEDICINE PET WHOLE BODY TECHNIQUE: 8.0 mCi F-18 FDG was injected intravenously. Full-ring PET imaging was performed from the head to foot after the radiotracer. CT data was obtained and used for attenuation correction and anatomic localization. Fasting blood glucose: 77 mg/dl COMPARISON:  Chest CT 06/26/2022 FINDINGS: Mediastinal blood pool activity: SUV max 1.5 HEAD/NECK: No hypermetabolic activity in the scalp. No hypermetabolic cervical lymph nodes. Incidental CT findings: none CHEST: Clustered small lymph nodes seen in the left supraclavicular region (86/2) with low level FDG accumulation ( SUV max = 2.5). Mild precarinal lymphadenopathy (14 mm short axis) and subcarinal lymphadenopathy (15 mm short axis) is mildly hypermetabolic with SUV max = 1.6-1.0. Similar mild hypermetabolism noted in the right hilar region. The patient's known right lower lobe pulmonary lesion is hypermetabolic  with SUV max = 4.4. Lesion measures 4.5 x 2.2 cm today compared to 4.4 x 2.2 cm previously. Incidental CT findings: Moderate right pleural effusion is slightly progressive in the interval. Centrilobular emphysema noted. No suspicious nodule or mass in the left lung. No left pleural effusion. ABDOMEN/PELVIS: No abnormal hypermetabolic activity within the liver, pancreas, adrenal glands, or spleen. No hypermetabolic lymph nodes in the abdomen or pelvis. Incidental CT findings: Numerous tiny calcified gallstones evident without gallbladder wall thickening or pericholecystic fluid. There is a tiny amount of gas there appears to be in the gallbladder lumen (axial image 175/series 2). No intrahepatic or extrahepatic biliary dilation. Status post aorto bi iliac endograft placement. No bowel obstruction. SKELETON: No focal hypermetabolic activity to suggest skeletal metastasis. Incidental CT findings: none EXTREMITIES: No abnormal hypermetabolic activity in the lower extremities. Incidental CT findings: Subcutaneous edema noted in the region of the right leg and ankle. IMPRESSION: 1. Right lower lobe pulmonary lesion shows low level hypermetabolism with SUV max = 4.4. Imaging features are concerning for primary bronchogenic neoplasm. 2. Mild hypermetabolism in  mediastinal and right hilar lymph nodes, suspicious for metastatic disease. 3. Clustered small lymph nodes in the left supraclavicular region with low level FDG accumulation. Metastatic disease a concern. 4. Moderate right pleural effusion, slightly progressive in the interval. 5. Cholelithiasis. There is a tiny gas bubble in the non dependent gallbladder lumen. This is of uncertain etiology and could be free gas or gas within a noncalcified cholesterol stone. No gallbladder wall thickening or pericholecystic fluid. No biliary dilation. Correlation for signs/symptoms of acute cholecystitis recommended. Follow-up ultrasound may prove helpful to further evaluate. 6.  Subcutaneous edema in the region of the right leg and ankle. 7.  Emphysema (ICD10-J43.9). These results will be called to the ordering clinician or representative by the Radiologist Assistant, and communication documented in the PACS or [employer]. Electronically Signed   By: Misty Stanley M.D.   On: 07/04/2022 11:46   CT CHEST WO CONTRAST  Result Date: 06/28/2022 CLINICAL DATA:  Shortness of breath, history of colon cancer, former smoker. * Tracking Code: BO * EXAM: CT CHEST WITHOUT CONTRAST TECHNIQUE: Multidetector CT imaging of the chest was performed following the standard protocol without IV contrast. RADIATION DOSE REDUCTION: This exam was performed according to the departmental dose-optimization program which includes automated exposure control, adjustment of the mA and/or kV according to patient size and/or use of iterative reconstruction technique. COMPARISON:  09/20/2016. FINDINGS: Cardiovascular: Atherosclerotic calcification of the aorta, aortic valve and coronary arteries. Enlarged pulmonic trunk and heart. No pericardial effusion. Mediastinum/Nodes: Low left internal jugular lymph nodes measure up to 8 mm (2/9), new. Mediastinal lymph nodes measure up to 1.3 cm in the low right paratracheal station, previously 6 mm. Subcarinal lymph node measures 2 cm, previously 4 mm. Suspect right hilar adenopathy although definitive assessment is challenging without IV contrast. No axillary adenopathy. Esophagus is grossly unremarkable. Lungs/Pleura: Centrilobular emphysema. Small area of amorphous consolidation in the perihilar right lower lobe (3/82). Posteromedial right lower lobe mass measures 2.2 x 4.4 cm (3/92). Additional 5 mm subpleural nodule inferiorly within the posterior right lower lobe (3/114). Large, partially loculated right pleural effusion. Findings are new from 09/20/2016. Mild pleuroparenchymal scarring in the left upper lobe. Probable calcified granuloma in the medial left lower lobe,  unchanged. 3 mm subpleural left lower lobe nodule (3/116), unchanged. No left pleural fluid. Airway is unremarkable. Upper Abdomen: Visualized portions of the liver, adrenal glands, kidneys, spleen, pancreas and stomach are grossly unremarkable. Visualized portion of the transverse colon is distended with gas. No upper abdominal adenopathy. Musculoskeletal: Degenerative changes in the spine. Flowing anterior osteophytosis in the thoracic spine. IMPRESSION: 1. Right lower lobe mass with right hilar and mediastinal adenopathy extending to the left paratracheal station and possibly low left internal jugular station. Findings are indicative of primary bronchogenic carcinoma. 2. Additional areas of nodular consolidation or nodularity in the right lower lobe, as detailed above. 3. Large, partially loculated right pleural effusion. 4. Aortic atherosclerosis (ICD10-I70.0). Coronary artery calcification. 5. Enlarged pulmonic trunk, indicative of pulmonary arterial hypertension. 6.  Emphysema (ICD10-J43.9). Electronically Signed   By: Lorin Picket M.D.   On: 06/28/2022 11:54    LD, Fluid 3 - 23 U/L 135 High  86 High   Tot prot 3.9  ASSESSMENT/PLAN   This is an elderly male with adenopathy and mass burden that points strongly to a primary lung malignancy. He is weak, recurrent exudative right pleural effusion. Beter post tap. Small pneumptorax, not expanding. Follwing -DNR -Agree with hopice -supportive care -prn thoracentesis -per rate or  recurrence will re visit placing pleurovax. -pleurodesis not an option for him -send home on oxygen 2 liters 24/7 until he sees me ( sats drop with activity, and the higer fio2 will help resolve the ptx) -will see on Thursday with repeat cxt and clarify any other interventions.     Thank you for allowing me to participate in the care of this patient.   Patient/Family are satisfied with care plan and all questions have been answered.  This document was prepared using  Dragon voice recognition software and may include unintentional dictation errors.     Wallene Huh, M.D.  Division of [place]

## 2022-07-26 LAB — COMP PANEL: LEUKEMIA/LYMPHOMA

## 2022-07-27 DIAGNOSIS — Z9981 Dependence on supplemental oxygen: Secondary | ICD-10-CM | POA: Diagnosis not present

## 2022-07-27 DIAGNOSIS — J449 Chronic obstructive pulmonary disease, unspecified: Secondary | ICD-10-CM | POA: Diagnosis not present

## 2022-07-27 DIAGNOSIS — R0902 Hypoxemia: Secondary | ICD-10-CM | POA: Diagnosis not present

## 2022-07-27 DIAGNOSIS — J90 Pleural effusion, not elsewhere classified: Secondary | ICD-10-CM | POA: Diagnosis not present

## 2022-07-28 LAB — BODY FLUID CULTURE W GRAM STAIN
Culture: NO GROWTH
Gram Stain: NONE SEEN

## 2022-07-31 DIAGNOSIS — I5022 Chronic systolic (congestive) heart failure: Secondary | ICD-10-CM | POA: Diagnosis not present

## 2022-07-31 DIAGNOSIS — I110 Hypertensive heart disease with heart failure: Secondary | ICD-10-CM | POA: Diagnosis not present

## 2022-07-31 DIAGNOSIS — C189 Malignant neoplasm of colon, unspecified: Secondary | ICD-10-CM | POA: Diagnosis not present

## 2022-07-31 DIAGNOSIS — I2510 Atherosclerotic heart disease of native coronary artery without angina pectoris: Secondary | ICD-10-CM | POA: Diagnosis not present

## 2022-07-31 DIAGNOSIS — J439 Emphysema, unspecified: Secondary | ICD-10-CM | POA: Diagnosis not present

## 2022-07-31 DIAGNOSIS — C3491 Malignant neoplasm of unspecified part of right bronchus or lung: Secondary | ICD-10-CM | POA: Diagnosis not present

## 2022-07-31 DIAGNOSIS — J910 Malignant pleural effusion: Secondary | ICD-10-CM | POA: Diagnosis not present

## 2022-08-07 LAB — FUNGUS CULTURE WITH STAIN

## 2022-08-07 LAB — FUNGAL ORGANISM REFLEX

## 2022-08-07 LAB — FUNGUS CULTURE RESULT

## 2022-08-15 DIAGNOSIS — J90 Pleural effusion, not elsewhere classified: Secondary | ICD-10-CM | POA: Diagnosis not present

## 2022-08-15 DIAGNOSIS — R918 Other nonspecific abnormal finding of lung field: Secondary | ICD-10-CM | POA: Diagnosis not present

## 2022-08-15 DIAGNOSIS — R0602 Shortness of breath: Secondary | ICD-10-CM | POA: Diagnosis not present

## 2022-08-16 ENCOUNTER — Other Ambulatory Visit: Payer: Self-pay | Admitting: Specialist

## 2022-08-16 DIAGNOSIS — J90 Pleural effusion, not elsewhere classified: Secondary | ICD-10-CM

## 2022-08-17 ENCOUNTER — Other Ambulatory Visit: Payer: Self-pay | Admitting: Specialist

## 2022-08-17 ENCOUNTER — Ambulatory Visit
Admission: RE | Admit: 2022-08-17 | Discharge: 2022-08-17 | Disposition: A | Discharge status: Home / Self Care | Source: Ambulatory Visit | Attending: Specialist | Admitting: Specialist

## 2022-08-17 DIAGNOSIS — J90 Pleural effusion, not elsewhere classified: Secondary | ICD-10-CM

## 2022-08-17 DIAGNOSIS — Z9889 Other specified postprocedural states: Secondary | ICD-10-CM | POA: Insufficient documentation

## 2022-08-17 LAB — BODY FLUID CELL COUNT WITH DIFFERENTIAL
Eos, Fluid: 0 %
Lymphs, Fluid: 80 %
Monocyte-Macrophage-Serous Fluid: 16 %
Neutrophil Count, Fluid: 4 %
Total Nucleated Cell Count, Fluid: 2681 cu mm

## 2022-08-17 LAB — GLUCOSE, PLEURAL OR PERITONEAL FLUID: Glucose, Fluid: 77 mg/dL

## 2022-08-17 LAB — LACTATE DEHYDROGENASE, PLEURAL OR PERITONEAL FLUID: LD, Fluid: 261 U/L — ABNORMAL HIGH (ref 3–23)

## 2022-08-17 LAB — PROTEIN, PLEURAL OR PERITONEAL FLUID: Total protein, fluid: 3.6 g/dL

## 2022-08-17 LAB — AMYLASE, PLEURAL OR PERITONEAL FLUID: Amylase, Fluid: 37 U/L

## 2022-08-17 MED ORDER — LIDOCAINE HCL (PF) 1 % IJ SOLN
10.0000 mL | Freq: Once | INTRAMUSCULAR | Status: AC
  Administered 2022-08-17: 10 mL via SUBCUTANEOUS
  Filled 2022-08-17: qty 10

## 2022-08-17 NOTE — Discharge Instructions (Signed)
Post instructions provided to patient and explained. PCS

## 2022-08-18 LAB — CYTOLOGY - NON PAP

## 2022-08-19 LAB — ACID FAST SMEAR (AFB, MYCOBACTERIA): Acid Fast Smear: NEGATIVE

## 2022-08-21 LAB — BODY FLUID CULTURE W GRAM STAIN
Culture: NO GROWTH
Gram Stain: NONE SEEN

## 2022-08-22 LAB — ACID FAST CULTURE WITH REFLEXED SENSITIVITIES (MYCOBACTERIA): Acid Fast Culture: NEGATIVE

## 2022-08-26 LAB — CHOLESTEROL, BODY FLUID: Cholesterol, Fluid: 66 mg/dL

## 2022-09-07 LAB — MISC LABCORP TEST (SEND OUT): Labcorp test code: 9985

## 2022-09-18 LAB — FUNGUS CULTURE WITH STAIN

## 2022-09-18 LAB — FUNGAL ORGANISM REFLEX

## 2022-09-18 LAB — FUNGUS CULTURE RESULT

## 2022-09-28 DEATH — deceased

## 2022-09-30 LAB — ACID FAST CULTURE WITH REFLEXED SENSITIVITIES (MYCOBACTERIA): Acid Fast Culture: NEGATIVE

## 2022-10-16 ENCOUNTER — Encounter: Payer: Self-pay | Admitting: *Deleted

## 2022-10-16 NOTE — Telephone Encounter (Signed)
This encounter was created in error - please disregard.

## 2022-12-04 ENCOUNTER — Ambulatory Visit (INDEPENDENT_AMBULATORY_CARE_PROVIDER_SITE_OTHER): Payer: Medicare HMO | Admitting: Vascular Surgery

## 2022-12-04 ENCOUNTER — Encounter (INDEPENDENT_AMBULATORY_CARE_PROVIDER_SITE_OTHER): Payer: Medicare HMO
